# Patient Record
Sex: Male | Born: 1955 | Race: Black or African American | Hispanic: No | Marital: Married | Smoking: Former smoker
Health system: Southern US, Community
[De-identification: ages and names within clinical notes are randomized; demographics above are authoritative.]

## PROBLEM LIST (undated history)

## (undated) DIAGNOSIS — Z87442 Personal history of urinary calculi: Secondary | ICD-10-CM

## (undated) DIAGNOSIS — E7800 Pure hypercholesterolemia, unspecified: Secondary | ICD-10-CM

## (undated) DIAGNOSIS — N281 Cyst of kidney, acquired: Secondary | ICD-10-CM

## (undated) DIAGNOSIS — C9110 Chronic lymphocytic leukemia of B-cell type not having achieved remission: Secondary | ICD-10-CM

## (undated) DIAGNOSIS — I10 Essential (primary) hypertension: Secondary | ICD-10-CM

## (undated) DIAGNOSIS — M1990 Unspecified osteoarthritis, unspecified site: Secondary | ICD-10-CM

## (undated) HISTORY — PX: TONSILLECTOMY AND ADENOIDECTOMY: SHX28

## (undated) HISTORY — PX: COLONOSCOPY: SHX174

## (undated) HISTORY — PX: WISDOM TOOTH EXTRACTION: SHX21

## (undated) HISTORY — PX: TONSILLECTOMY: SUR1361

## (undated) HISTORY — PX: JOINT REPLACEMENT: SHX530

## (undated) HISTORY — PX: BACK SURGERY: SHX140

## (undated) HISTORY — PX: APPENDECTOMY: SHX54

---
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
## 2001-10-06 ENCOUNTER — Encounter: Admission: RE | Admit: 2001-10-06 | Discharge: 2002-01-04 | Payer: Self-pay | Admitting: Anesthesiology

## 2001-10-14 ENCOUNTER — Ambulatory Visit (HOSPITAL_COMMUNITY): Admission: RE | Admit: 2001-10-14 | Discharge: 2001-10-14 | Payer: Self-pay | Admitting: Anesthesiology

## 2001-10-14 ENCOUNTER — Encounter: Payer: Self-pay | Admitting: Anesthesiology

## 2002-01-08 ENCOUNTER — Encounter: Admission: RE | Admit: 2002-01-08 | Discharge: 2002-01-26 | Payer: Self-pay

## 2002-02-01 ENCOUNTER — Encounter: Admission: RE | Admit: 2002-02-01 | Discharge: 2002-05-02 | Payer: Self-pay | Admitting: Anesthesiology

## 2002-06-11 ENCOUNTER — Encounter: Admission: RE | Admit: 2002-06-11 | Discharge: 2002-09-09 | Payer: Self-pay

## 2002-09-20 ENCOUNTER — Encounter
Admission: RE | Admit: 2002-09-20 | Discharge: 2002-12-19 | Payer: Self-pay | Admitting: Physical Medicine & Rehabilitation

## 2003-01-12 ENCOUNTER — Encounter
Admission: RE | Admit: 2003-01-12 | Discharge: 2003-04-12 | Payer: Self-pay | Admitting: Physical Medicine & Rehabilitation

## 2003-01-25 ENCOUNTER — Encounter: Payer: Self-pay | Admitting: Physical Medicine & Rehabilitation

## 2003-01-25 ENCOUNTER — Ambulatory Visit (HOSPITAL_COMMUNITY)
Admission: RE | Admit: 2003-01-25 | Discharge: 2003-01-25 | Payer: Self-pay | Admitting: Physical Medicine & Rehabilitation

## 2003-02-14 ENCOUNTER — Ambulatory Visit (HOSPITAL_COMMUNITY)
Admission: RE | Admit: 2003-02-14 | Discharge: 2003-02-14 | Payer: Self-pay | Admitting: Physical Medicine & Rehabilitation

## 2003-02-14 ENCOUNTER — Encounter: Payer: Self-pay | Admitting: Physical Medicine & Rehabilitation

## 2003-05-17 ENCOUNTER — Encounter
Admission: RE | Admit: 2003-05-17 | Discharge: 2003-08-15 | Payer: Self-pay | Admitting: Physical Medicine & Rehabilitation

## 2003-08-11 ENCOUNTER — Encounter
Admission: RE | Admit: 2003-08-11 | Discharge: 2003-11-09 | Payer: Self-pay | Admitting: Physical Medicine & Rehabilitation

## 2003-11-10 ENCOUNTER — Encounter
Admission: RE | Admit: 2003-11-10 | Discharge: 2004-02-08 | Payer: Self-pay | Admitting: Physical Medicine & Rehabilitation

## 2015-02-22 ENCOUNTER — Ambulatory Visit: Payer: Self-pay | Admitting: Physician Assistant

## 2015-02-22 NOTE — H&P (Signed)
TOTAL KNEE ADMISSION H&P  Patient is being admitted for left total knee arthroplasty.  Subjective:  Chief Complaint:left knee pain.  HPI: Kevin Patterson, 59 y.o. male, has a history of pain and functional disability in the left knee due to arthritis and has failed non-surgical conservative treatments for greater than 12 weeks to includecorticosteriod injections, viscosupplementation injections and activity modification.  Onset of symptoms was gradual, starting >10 years ago with gradually worsening course since that time. The patient noted prior procedures on the knee to include  arthroscopy on the bilaterally knee(s).  Patient currently rates pain in the left knee(s) at 8 out of 10 with activity. Patient has night pain, worsening of pain with activity and weight bearing, pain that interferes with activities of daily living, pain with passive range of motion, crepitus and joint swelling.  Patient has evidence of periarticular osteophytes and joint space narrowing by imaging studies.There is no active infection.  PMH: HTN, high cholesterol, boderline renal insufficiency, lumbar DDD, remote hx lumbar staph infection 2001, bilat knee OA Surgical Hx: bilat knee scopes 1972, appendectomy 1979  Meds: HCTZ, amlodipine besylate, myrbetrig, benicar, atorvastatin, aspirin  Allergies NKDA  FH: mother- stroke, seizures; father cancer;   SH: 1/2 ppd smoker for 17yrs stopped 1982, ETOH 2-3 glasses 2-3 times a week, married lives with spouse 3 story residence, works as Adult nurse  Review of Systems  Genitourinary: Positive for frequency and hematuria.  Musculoskeletal: Positive for back pain, joint pain and falls.  All other systems reviewed and are negative.   Objective:  Physical Exam  Constitutional: He is oriented to person, place, and time. He appears well-developed and well-nourished. No distress.  HENT:  Head: Normocephalic and atraumatic.  Nose: Nose normal.  Eyes:  Conjunctivae and EOM are normal. Pupils are equal, round, and reactive to light.  Neck: Normal range of motion. Neck supple.  Cardiovascular: Normal rate, regular rhythm and intact distal pulses.   Murmur heard. Respiratory: Effort normal and breath sounds normal. No respiratory distress. He has no wheezes. He has no rales. He exhibits no tenderness.  GI: Soft. Bowel sounds are normal. He exhibits no distension. There is no tenderness.  Musculoskeletal:       Left knee: He exhibits swelling. He exhibits normal range of motion, no LCL laxity and no MCL laxity. Tenderness found.  Lymphadenopathy:    He has no cervical adenopathy.  Neurological: He is alert and oriented to person, place, and time. No cranial nerve deficit.  Skin: Skin is warm and dry. No rash noted. No erythema.  Psychiatric: He has a normal mood and affect. His behavior is normal.    Vital signs in last 24 hours: @VSRANGES @  Labs:   There is no height or weight on file to calculate BMI.   Imaging Review Plain radiographs demonstrate moderate degenerative joint disease of the left knee(s). The overall alignment issignificant varus. The bone quality appears to be good for age and reported activity level.  Assessment/Plan:  End stage arthritis, left knee   The patient history, physical examination, clinical judgment of the provider and imaging studies are consistent with end stage degenerative joint disease of the left knee(s) and total knee arthroplasty is deemed medically necessary. The treatment options including medical management, injection therapy arthroscopy and arthroplasty were discussed at length. The risks and benefits of total knee arthroplasty were presented and reviewed. The risks due to aseptic loosening, infection, stiffness, patella tracking problems, thromboembolic complications and other imponderables were discussed. The patient acknowledged the  explanation, agreed to proceed with the plan and consent was  signed. Patient is being admitted for inpatient treatment for surgery, pain control, PT, OT, prophylactic antibiotics, VTE prophylaxis, progressive ambulation and ADL's and discharge planning. The patient is planning to be discharged home with home health services

## 2015-03-02 ENCOUNTER — Encounter (HOSPITAL_COMMUNITY): Payer: Self-pay

## 2015-03-02 ENCOUNTER — Encounter (HOSPITAL_COMMUNITY)
Admission: RE | Admit: 2015-03-02 | Discharge: 2015-03-02 | Disposition: A | Discharge status: Home / Self Care | Payer: Managed Care, Other (non HMO) | Source: Ambulatory Visit | Attending: Physician Assistant | Admitting: Physician Assistant

## 2015-03-02 ENCOUNTER — Encounter (HOSPITAL_COMMUNITY)
Admission: RE | Admit: 2015-03-02 | Discharge: 2015-03-02 | Disposition: A | Discharge status: Home / Self Care | Payer: Managed Care, Other (non HMO) | Source: Ambulatory Visit | Attending: Orthopedic Surgery | Admitting: Orthopedic Surgery

## 2015-03-02 DIAGNOSIS — Z01818 Encounter for other preprocedural examination: Secondary | ICD-10-CM | POA: Diagnosis present

## 2015-03-02 DIAGNOSIS — Z0183 Encounter for blood typing: Secondary | ICD-10-CM | POA: Diagnosis not present

## 2015-03-02 DIAGNOSIS — R9431 Abnormal electrocardiogram [ECG] [EKG]: Secondary | ICD-10-CM | POA: Diagnosis not present

## 2015-03-02 DIAGNOSIS — I10 Essential (primary) hypertension: Secondary | ICD-10-CM | POA: Insufficient documentation

## 2015-03-02 DIAGNOSIS — M1712 Unilateral primary osteoarthritis, left knee: Secondary | ICD-10-CM | POA: Diagnosis not present

## 2015-03-02 DIAGNOSIS — Z01812 Encounter for preprocedural laboratory examination: Secondary | ICD-10-CM | POA: Insufficient documentation

## 2015-03-02 HISTORY — DX: Cyst of kidney, acquired: N28.1

## 2015-03-02 HISTORY — DX: Unspecified osteoarthritis, unspecified site: M19.90

## 2015-03-02 HISTORY — DX: Essential (primary) hypertension: I10

## 2015-03-02 LAB — COMPREHENSIVE METABOLIC PANEL
ALT: 20 U/L (ref 17–63)
AST: 31 U/L (ref 15–41)
Albumin: 4.3 g/dL (ref 3.5–5.0)
Alkaline Phosphatase: 57 U/L (ref 38–126)
Anion gap: 9 (ref 5–15)
BILIRUBIN TOTAL: 1.3 mg/dL — AB (ref 0.3–1.2)
BUN: 17 mg/dL (ref 6–20)
CHLORIDE: 104 mmol/L (ref 101–111)
CO2: 25 mmol/L (ref 22–32)
CREATININE: 1.19 mg/dL (ref 0.61–1.24)
Calcium: 9.7 mg/dL (ref 8.9–10.3)
Glucose, Bld: 94 mg/dL (ref 65–99)
Potassium: 4.1 mmol/L (ref 3.5–5.1)
Sodium: 138 mmol/L (ref 135–145)
TOTAL PROTEIN: 7.4 g/dL (ref 6.5–8.1)

## 2015-03-02 LAB — CBC WITH DIFFERENTIAL/PLATELET
Basophils Absolute: 0 10*3/uL (ref 0.0–0.1)
Basophils Relative: 0 %
EOS PCT: 2 %
Eosinophils Absolute: 0.1 10*3/uL (ref 0.0–0.7)
HCT: 40 % (ref 39.0–52.0)
Hemoglobin: 12.6 g/dL — ABNORMAL LOW (ref 13.0–17.0)
LYMPHS ABS: 2.6 10*3/uL (ref 0.7–4.0)
LYMPHS PCT: 42 %
MCH: 28.1 pg (ref 26.0–34.0)
MCHC: 31.5 g/dL (ref 30.0–36.0)
MCV: 89.1 fL (ref 78.0–100.0)
MONO ABS: 0.5 10*3/uL (ref 0.1–1.0)
Monocytes Relative: 9 %
Neutro Abs: 2.9 10*3/uL (ref 1.7–7.7)
Neutrophils Relative %: 47 %
PLATELETS: 203 10*3/uL (ref 150–400)
RBC: 4.49 MIL/uL (ref 4.22–5.81)
RDW: 13.3 % (ref 11.5–15.5)
WBC: 6.1 10*3/uL (ref 4.0–10.5)

## 2015-03-02 LAB — URINALYSIS, ROUTINE W REFLEX MICROSCOPIC
Bilirubin Urine: NEGATIVE
Glucose, UA: NEGATIVE mg/dL
Ketones, ur: NEGATIVE mg/dL
LEUKOCYTES UA: NEGATIVE
NITRITE: NEGATIVE
PH: 6.5 (ref 5.0–8.0)
Protein, ur: NEGATIVE mg/dL
SPECIFIC GRAVITY, URINE: 1.014 (ref 1.005–1.030)
Urobilinogen, UA: 0.2 mg/dL (ref 0.0–1.0)

## 2015-03-02 LAB — URINE MICROSCOPIC-ADD ON

## 2015-03-02 LAB — SURGICAL PCR SCREEN
MRSA, PCR: NEGATIVE
Staphylococcus aureus: NEGATIVE

## 2015-03-02 LAB — TYPE AND SCREEN
ABO/RH(D): O POS
Antibody Screen: NEGATIVE

## 2015-03-02 LAB — APTT: aPTT: 29 seconds (ref 24–37)

## 2015-03-02 LAB — ABO/RH: ABO/RH(D): O POS

## 2015-03-02 LAB — PROTIME-INR
INR: 1.02 (ref 0.00–1.49)
PROTHROMBIN TIME: 13.6 s (ref 11.6–15.2)

## 2015-03-02 NOTE — Pre-Procedure Instructions (Signed)
    Kevin Patterson  03/02/2015     No Pharmacies Listed   Your procedure is scheduled on Monday March 13, 2015  Report to [facility] Admitting at 5:30 A.M.  Call this number if you have problems the morning of surgery:  [phone]   Remember:  Do not eat food or drink liquids after midnight.  Take these medicines the morning of surgery with A SIP OF WATER :Mrybetrig, amlodipine. Please do not take herbal medications, vitamins, aspirin products BC's Goody's, iburpofen or naproxyn containing medications 5 days prior to surgery.   Do not wear lotions, powders, or perfumes.  You may wear deodorant.  Do not shave 48 hours prior to surgery.  Men may shave face and neck.  Do not bring valuables to the hospital.  [facility] is not responsible for any belongings or valuables.  Contacts, dentures or bridgework may not be worn into surgery.  Leave your suitcase in the car.  After surgery it may be brought to your room.  For patients admitted to the hospital, discharge time will be determined by your treatment team.    Please read over the following fact sheets that you were given. Pain Booklet, Coughing and Deep Breathing, MRSA Information and Surgical Site Infection Prevention

## 2015-03-02 NOTE — Pre-Procedure Instructions (Deleted)
    Laiden Milles  03/02/2015     No Pharmacies Listed   Your procedure is scheduled on Mon, Nov 14 @ 7:15 AM  Report to [facility] Admitting at 5:30 AM  Call this number if you have problems the morning of surgery:  [phone]   Remember:  Do not eat food or drink liquids after midnight.  Take these medicines the morning of surgery with A SIP OF WATER              No Goody's,BC's,Aleve,Aspirin,Ibuprofen,Motrin,Advil,Fish Oil,or any Herbal Medications.    Do not wear jewelry.  Do not wear lotions, powders, or colognes .  You may wear deodorant.             Men may shave face and neck.  Do not bring valuables to the hospital.  [facility] is not responsible for any belongings or valuables.  Contacts, dentures or bridgework may not be worn into surgery.  Leave your suitcase in the car.  After surgery it may be brought to your room.  For patients admitted to the hospital, discharge time will be determined by your treatment team.  Patients discharged the day of surgery will not be allowed to drive home.    Special instructions:  [place] - Preparing for Surgery  Before surgery, you can play an important role.  Because skin is not sterile, your skin needs to be as free of germs as possible.  You can reduce the number of germs on you skin by washing with CHG (chlorahexidine gluconate) soap before surgery.  CHG is an antiseptic cleaner which kills germs and bonds with the skin to continue killing germs even after washing.  Please DO NOT use if you have an allergy to CHG or antibacterial soaps.  If your skin becomes reddened/irritated stop using the CHG and inform your nurse when you arrive at Short Stay.  Do not shave (including legs and underarms) for at least 48 hours prior to the first CHG shower.  You may shave your face.   Please read over the following fact sheets that you were given. Pain Booklet, Coughing and Deep Breathing, Blood Transfusion  Information, MRSA Information and Surgical Site Infection Prevention

## 2015-03-03 LAB — URINE CULTURE: Culture: 8000

## 2015-03-13 ENCOUNTER — Encounter (HOSPITAL_COMMUNITY)
Admission: RE | Disposition: A | Discharge status: Home Health | Payer: Self-pay | Source: Ambulatory Visit | Attending: Orthopedic Surgery

## 2015-03-13 ENCOUNTER — Inpatient Hospital Stay (HOSPITAL_COMMUNITY): Payer: Managed Care, Other (non HMO) | Admitting: Certified Registered Nurse Anesthetist

## 2015-03-13 ENCOUNTER — Encounter (HOSPITAL_COMMUNITY): Payer: Self-pay | Admitting: General Practice

## 2015-03-13 ENCOUNTER — Inpatient Hospital Stay (HOSPITAL_COMMUNITY)
Admission: RE | Admit: 2015-03-13 | Discharge: 2015-03-15 | DRG: 470 | Disposition: A | Discharge status: Home Health | Payer: Managed Care, Other (non HMO) | Source: Ambulatory Visit | Attending: Orthopedic Surgery | Admitting: Orthopedic Surgery

## 2015-03-13 DIAGNOSIS — Z7982 Long term (current) use of aspirin: Secondary | ICD-10-CM | POA: Diagnosis not present

## 2015-03-13 DIAGNOSIS — Z79899 Other long term (current) drug therapy: Secondary | ICD-10-CM

## 2015-03-13 DIAGNOSIS — R066 Hiccough: Secondary | ICD-10-CM | POA: Diagnosis not present

## 2015-03-13 DIAGNOSIS — M25562 Pain in left knee: Secondary | ICD-10-CM | POA: Diagnosis present

## 2015-03-13 DIAGNOSIS — N281 Cyst of kidney, acquired: Secondary | ICD-10-CM | POA: Diagnosis present

## 2015-03-13 DIAGNOSIS — M170 Bilateral primary osteoarthritis of knee: Secondary | ICD-10-CM | POA: Diagnosis present

## 2015-03-13 DIAGNOSIS — E7800 Pure hypercholesterolemia, unspecified: Secondary | ICD-10-CM | POA: Diagnosis present

## 2015-03-13 DIAGNOSIS — M179 Osteoarthritis of knee, unspecified: Secondary | ICD-10-CM | POA: Diagnosis present

## 2015-03-13 DIAGNOSIS — I10 Essential (primary) hypertension: Secondary | ICD-10-CM | POA: Diagnosis present

## 2015-03-13 DIAGNOSIS — M1710 Unilateral primary osteoarthritis, unspecified knee: Secondary | ICD-10-CM | POA: Diagnosis present

## 2015-03-13 HISTORY — PX: TOTAL KNEE ARTHROPLASTY: SHX125

## 2015-03-13 SURGERY — ARTHROPLASTY, KNEE, TOTAL
Anesthesia: Monitor Anesthesia Care | Site: Knee | Laterality: Left

## 2015-03-13 MED ORDER — CEFAZOLIN SODIUM-DEXTROSE 2-3 GM-% IV SOLR
2.0000 g | INTRAVENOUS | Status: AC
  Administered 2015-03-13: 2 g via INTRAVENOUS
  Filled 2015-03-13: qty 50

## 2015-03-13 MED ORDER — POLYETHYLENE GLYCOL 3350 17 G PO PACK
17.0000 g | PACK | Freq: Two times a day (BID) | ORAL | Status: DC
  Administered 2015-03-13 – 2015-03-15 (×5): 17 g via ORAL
  Filled 2015-03-13 (×5): qty 1

## 2015-03-13 MED ORDER — INFLUENZA VAC SPLIT QUAD 0.5 ML IM SUSY
0.5000 mL | PREFILLED_SYRINGE | INTRAMUSCULAR | Status: AC
  Administered 2015-03-14: 0.5 mL via INTRAMUSCULAR
  Filled 2015-03-13 (×2): qty 0.5

## 2015-03-13 MED ORDER — ACETAMINOPHEN 325 MG PO TABS: 650.0000 mg | ORAL_TABLET | Freq: Four times a day (QID) | ORAL | Status: DC | PRN

## 2015-03-13 MED ORDER — CHLORHEXIDINE GLUCONATE 4 % EX LIQD
60.0000 mL | Freq: Once | CUTANEOUS | Status: DC
Start: 2015-03-13 — End: 2015-03-13

## 2015-03-13 MED ORDER — MIDAZOLAM HCL 2 MG/2ML IJ SOLN
INTRAMUSCULAR | Status: AC
  Filled 2015-03-13: qty 4

## 2015-03-13 MED ORDER — ATORVASTATIN CALCIUM 40 MG PO TABS
40.0000 mg | ORAL_TABLET | Freq: Every day | ORAL | Status: DC
  Administered 2015-03-13 – 2015-03-14 (×2): 40 mg via ORAL
  Filled 2015-03-13 (×2): qty 1

## 2015-03-13 MED ORDER — FENTANYL CITRATE (PF) 100 MCG/2ML IJ SOLN
INTRAMUSCULAR | Status: DC | PRN
  Administered 2015-03-13: 25 ug via INTRAVENOUS
  Administered 2015-03-13: 50 ug via INTRAVENOUS

## 2015-03-13 MED ORDER — ONDANSETRON HCL 4 MG/2ML IJ SOLN
INTRAMUSCULAR | Status: AC
  Filled 2015-03-13: qty 2

## 2015-03-13 MED ORDER — BUPIVACAINE-EPINEPHRINE (PF) 0.25% -1:200000 IJ SOLN
INTRAMUSCULAR | Status: AC
  Filled 2015-03-13: qty 30

## 2015-03-13 MED ORDER — CEFUROXIME SODIUM 1.5 G IJ SOLR
INTRAMUSCULAR | Status: AC
  Filled 2015-03-13: qty 1.5

## 2015-03-13 MED ORDER — LIDOCAINE HCL (CARDIAC) 20 MG/ML IV SOLN
INTRAVENOUS | Status: AC
  Filled 2015-03-13: qty 5

## 2015-03-13 MED ORDER — ONDANSETRON HCL 4 MG/2ML IJ SOLN: 4.0000 mg | Freq: Four times a day (QID) | INTRAMUSCULAR | Status: DC | PRN

## 2015-03-13 MED ORDER — MIDAZOLAM HCL 5 MG/5ML IJ SOLN
INTRAMUSCULAR | Status: DC | PRN
  Administered 2015-03-13 (×2): 1 mg via INTRAVENOUS

## 2015-03-13 MED ORDER — DEXAMETHASONE SODIUM PHOSPHATE 4 MG/ML IJ SOLN
INTRAMUSCULAR | Status: DC | PRN
  Administered 2015-03-13: 10 mg via INTRAVENOUS

## 2015-03-13 MED ORDER — MIRABEGRON ER 25 MG PO TB24
50.0000 mg | ORAL_TABLET | Freq: Every day | ORAL | Status: DC
  Administered 2015-03-13 – 2015-03-15 (×3): 50 mg via ORAL
  Filled 2015-03-13 (×4): qty 2

## 2015-03-13 MED ORDER — OXYCODONE HCL 5 MG PO TABS: 5.0000 mg | ORAL_TABLET | Freq: Once | ORAL | Status: DC | PRN

## 2015-03-13 MED ORDER — OXYCODONE HCL 5 MG PO TABS
5.0000 mg | ORAL_TABLET | ORAL | Status: DC | PRN
  Administered 2015-03-13 – 2015-03-15 (×13): 10 mg via ORAL
  Filled 2015-03-13 (×13): qty 2

## 2015-03-13 MED ORDER — LACTATED RINGERS IV SOLN
INTRAVENOUS | Status: DC | PRN
  Administered 2015-03-13 (×2): via INTRAVENOUS

## 2015-03-13 MED ORDER — BUPIVACAINE IN DEXTROSE 0.75-8.25 % IT SOLN
INTRATHECAL | Status: DC | PRN
  Administered 2015-03-13: 2 mL via INTRATHECAL

## 2015-03-13 MED ORDER — ACETAMINOPHEN 160 MG/5ML PO SOLN
325.0000 mg | ORAL | Status: DC | PRN
  Filled 2015-03-13: qty 20.3

## 2015-03-13 MED ORDER — PROPOFOL 500 MG/50ML IV EMUL
INTRAVENOUS | Status: DC | PRN
  Administered 2015-03-13: 25 ug/kg/min via INTRAVENOUS
  Administered 2015-03-13: 09:00:00 via INTRAVENOUS

## 2015-03-13 MED ORDER — BUPIVACAINE-EPINEPHRINE (PF) 0.5% -1:200000 IJ SOLN
INTRAMUSCULAR | Status: DC | PRN
  Administered 2015-03-13: 15 mL via PERINEURAL

## 2015-03-13 MED ORDER — ONDANSETRON HCL 4 MG/2ML IJ SOLN
INTRAMUSCULAR | Status: DC | PRN
  Administered 2015-03-13: 4 mg via INTRAVENOUS

## 2015-03-13 MED ORDER — CEFAZOLIN SODIUM-DEXTROSE 2-3 GM-% IV SOLR
2.0000 g | Freq: Four times a day (QID) | INTRAVENOUS | Status: AC
  Administered 2015-03-13 (×2): 2 g via INTRAVENOUS
  Filled 2015-03-13 (×2): qty 50

## 2015-03-13 MED ORDER — CALCIUM CARBONATE-VITAMIN D 500-200 MG-UNIT PO TABS
1.0000 | ORAL_TABLET | Freq: Every day | ORAL | Status: DC
  Administered 2015-03-13 – 2015-03-15 (×3): 1 via ORAL
  Filled 2015-03-13 (×3): qty 1

## 2015-03-13 MED ORDER — PHENOL 1.4 % MT LIQD: 1.0000 | OROMUCOSAL | Status: DC | PRN

## 2015-03-13 MED ORDER — SODIUM CHLORIDE 0.9 % IR SOLN
Status: DC | PRN
  Administered 2015-03-13: 1

## 2015-03-13 MED ORDER — ALUM & MAG HYDROXIDE-SIMETH 200-200-20 MG/5ML PO SUSP: 30.0000 mL | ORAL | Status: DC | PRN

## 2015-03-13 MED ORDER — POTASSIUM CHLORIDE IN NACL 20-0.9 MEQ/L-% IV SOLN
INTRAVENOUS | Status: DC
  Administered 2015-03-13 (×2): via INTRAVENOUS
  Filled 2015-03-13 (×2): qty 1000

## 2015-03-13 MED ORDER — IRBESARTAN 150 MG PO TABS: 150.0000 mg | ORAL_TABLET | Freq: Every day | ORAL | Status: DC

## 2015-03-13 MED ORDER — ACETAMINOPHEN 650 MG RE SUPP: 650.0000 mg | Freq: Four times a day (QID) | RECTAL | Status: DC | PRN

## 2015-03-13 MED ORDER — DOCUSATE SODIUM 100 MG PO CAPS
100.0000 mg | ORAL_CAPSULE | Freq: Two times a day (BID) | ORAL | Status: DC
  Administered 2015-03-13 – 2015-03-15 (×5): 100 mg via ORAL
  Filled 2015-03-13 (×5): qty 1

## 2015-03-13 MED ORDER — GLYCOPYRROLATE 0.2 MG/ML IJ SOLN
INTRAMUSCULAR | Status: DC | PRN
  Administered 2015-03-13: 0.2 mg via INTRAVENOUS

## 2015-03-13 MED ORDER — ACETAMINOPHEN 325 MG PO TABS: 325.0000 mg | ORAL_TABLET | ORAL | Status: DC | PRN

## 2015-03-13 MED ORDER — MENTHOL 3 MG MT LOZG: 1.0000 | LOZENGE | OROMUCOSAL | Status: DC | PRN

## 2015-03-13 MED ORDER — HYDROCHLOROTHIAZIDE 25 MG PO TABS
25.0000 mg | ORAL_TABLET | Freq: Every day | ORAL | Status: DC
  Filled 2015-03-13: qty 1

## 2015-03-13 MED ORDER — FENTANYL CITRATE (PF) 250 MCG/5ML IJ SOLN
INTRAMUSCULAR | Status: AC
  Filled 2015-03-13: qty 5

## 2015-03-13 MED ORDER — CALCIUM-VITAMIN D-VITAMIN K 500-1000-40 MG-UNT-MCG PO CHEW: 1.0000 | CHEWABLE_TABLET | Freq: Every day | ORAL | Status: DC

## 2015-03-13 MED ORDER — METOCLOPRAMIDE HCL 5 MG/ML IJ SOLN: 5.0000 mg | Freq: Three times a day (TID) | INTRAMUSCULAR | Status: DC | PRN

## 2015-03-13 MED ORDER — DIPHENHYDRAMINE HCL 12.5 MG/5ML PO ELIX: 12.5000 mg | ORAL_SOLUTION | ORAL | Status: DC | PRN

## 2015-03-13 MED ORDER — ONDANSETRON HCL 4 MG PO TABS: 4.0000 mg | ORAL_TABLET | Freq: Four times a day (QID) | ORAL | Status: DC | PRN

## 2015-03-13 MED ORDER — METOCLOPRAMIDE HCL 5 MG PO TABS: 5.0000 mg | ORAL_TABLET | Freq: Three times a day (TID) | ORAL | Status: DC | PRN

## 2015-03-13 MED ORDER — DEXAMETHASONE SODIUM PHOSPHATE 10 MG/ML IJ SOLN
10.0000 mg | Freq: Three times a day (TID) | INTRAMUSCULAR | Status: AC
  Administered 2015-03-13 – 2015-03-14 (×3): 10 mg via INTRAVENOUS
  Filled 2015-03-13 (×3): qty 1

## 2015-03-13 MED ORDER — HYDROMORPHONE HCL 1 MG/ML IJ SOLN
1.0000 mg | INTRAMUSCULAR | Status: DC | PRN
  Administered 2015-03-13 – 2015-03-14 (×4): 1 mg via INTRAVENOUS
  Filled 2015-03-13 (×4): qty 1

## 2015-03-13 MED ORDER — APIXABAN 2.5 MG PO TABS
2.5000 mg | ORAL_TABLET | Freq: Two times a day (BID) | ORAL | Status: DC
  Administered 2015-03-14 – 2015-03-15 (×3): 2.5 mg via ORAL
  Filled 2015-03-13 (×3): qty 1

## 2015-03-13 MED ORDER — CELECOXIB 200 MG PO CAPS
200.0000 mg | ORAL_CAPSULE | Freq: Two times a day (BID) | ORAL | Status: DC
  Administered 2015-03-13 – 2015-03-15 (×5): 200 mg via ORAL
  Filled 2015-03-13 (×5): qty 1

## 2015-03-13 MED ORDER — HYDROMORPHONE HCL 1 MG/ML IJ SOLN: 0.2500 mg | INTRAMUSCULAR | Status: DC | PRN

## 2015-03-13 MED ORDER — OXYCODONE HCL 5 MG/5ML PO SOLN: 5.0000 mg | Freq: Once | ORAL | Status: DC | PRN

## 2015-03-13 MED ORDER — BUPIVACAINE-EPINEPHRINE 0.25% -1:200000 IJ SOLN
INTRAMUSCULAR | Status: DC | PRN
  Administered 2015-03-13: 30 mL

## 2015-03-13 MED ORDER — PROPOFOL 10 MG/ML IV BOLUS
INTRAVENOUS | Status: AC
  Filled 2015-03-13: qty 20

## 2015-03-13 MED ORDER — CEFUROXIME SODIUM 1.5 G IJ SOLR
INTRAMUSCULAR | Status: DC | PRN
  Administered 2015-03-13: 1.5 g

## 2015-03-13 MED ORDER — SODIUM CHLORIDE 0.9 % IV SOLN
INTRAVENOUS | Status: DC
Start: 2015-03-13 — End: 2015-03-13

## 2015-03-13 MED ORDER — TRANEXAMIC ACID 1000 MG/10ML IV SOLN
1000.0000 mg | INTRAVENOUS | Status: DC
  Filled 2015-03-13: qty 10

## 2015-03-13 MED ORDER — AMLODIPINE BESYLATE 10 MG PO TABS
10.0000 mg | ORAL_TABLET | Freq: Every day | ORAL | Status: DC
Start: 2015-03-15 — End: 2015-03-15
  Filled 2015-03-13: qty 1

## 2015-03-13 SURGICAL SUPPLY — 75 items
BANDAGE ESMARK 6X9 LF (GAUZE/BANDAGES/DRESSINGS) ×1 IMPLANT
BENZOIN TINCTURE PRP APPL 2/3 (GAUZE/BANDAGES/DRESSINGS) ×3 IMPLANT
BLADE SAGITTAL 25.0X1.19X90 (BLADE) ×2 IMPLANT
BLADE SAGITTAL 25.0X1.19X90MM (BLADE) ×1
BLADE SAW SGTL 11.0X1.19X90.0M (BLADE) IMPLANT
BLADE SAW SGTL 13.0X1.19X90.0M (BLADE) ×3 IMPLANT
BLADE SURG 10 STRL SS (BLADE) ×15 IMPLANT
BNDG COHESIVE 6X5 TAN NS LF (GAUZE/BANDAGES/DRESSINGS) ×3 IMPLANT
BNDG ELASTIC 6X10 VLCR STRL LF (GAUZE/BANDAGES/DRESSINGS) ×3 IMPLANT
BNDG ELASTIC 6X15 VLCR STRL LF (GAUZE/BANDAGES/DRESSINGS) ×3 IMPLANT
BNDG ESMARK 6X9 LF (GAUZE/BANDAGES/DRESSINGS) ×3
BOWL SMART MIX CTS (DISPOSABLE) ×3 IMPLANT
CAPT KNEE TOTAL 3 ATTUNE ×3 IMPLANT
CEMENT HV SMART SET (Cement) ×6 IMPLANT
CLOSURE WOUND 1/2 X4 (GAUZE/BANDAGES/DRESSINGS) ×1
COVER SURGICAL LIGHT HANDLE (MISCELLANEOUS) ×3 IMPLANT
CUFF TOURNIQUET SINGLE 34IN LL (TOURNIQUET CUFF) ×3 IMPLANT
CUFF TOURNIQUET SINGLE 44IN (TOURNIQUET CUFF) IMPLANT
DECANTER SPIKE VIAL GLASS SM (MISCELLANEOUS) ×3 IMPLANT
DRAPE EXTREMITY T 121X128X90 (DRAPE) ×3 IMPLANT
DRAPE INCISE IOBAN 66X45 STRL (DRAPES) ×3 IMPLANT
DRAPE PROXIMA HALF (DRAPES) ×3 IMPLANT
DRAPE U-SHAPE 47X51 STRL (DRAPES) ×3 IMPLANT
DRSG AQUACEL AG ADV 3.5X14 (GAUZE/BANDAGES/DRESSINGS) ×3 IMPLANT
DRSG PAD ABDOMINAL 8X10 ST (GAUZE/BANDAGES/DRESSINGS) ×6 IMPLANT
DURAPREP 26ML APPLICATOR (WOUND CARE) ×6 IMPLANT
ELECT CAUTERY BLADE 6.4 (BLADE) ×3 IMPLANT
ELECT REM PT RETURN 9FT ADLT (ELECTROSURGICAL) ×3
ELECTRODE REM PT RTRN 9FT ADLT (ELECTROSURGICAL) ×1 IMPLANT
EVACUATOR 1/8 PVC DRAIN (DRAIN) ×3 IMPLANT
FACESHIELD WRAPAROUND (MASK) ×3 IMPLANT
GAUZE SPONGE 4X4 12PLY STRL (GAUZE/BANDAGES/DRESSINGS) ×3 IMPLANT
GLOVE BIO SURGEON STRL SZ7 (GLOVE) ×3 IMPLANT
GLOVE BIOGEL PI IND STRL 7.0 (GLOVE) ×1 IMPLANT
GLOVE BIOGEL PI IND STRL 7.5 (GLOVE) ×1 IMPLANT
GLOVE BIOGEL PI INDICATOR 7.0 (GLOVE) ×2
GLOVE BIOGEL PI INDICATOR 7.5 (GLOVE) ×2
GLOVE SS BIOGEL STRL SZ 7.5 (GLOVE) ×1 IMPLANT
GLOVE SUPERSENSE BIOGEL SZ 7.5 (GLOVE) ×2
GOWN STRL REUS W/ TWL LRG LVL3 (GOWN DISPOSABLE) ×1 IMPLANT
GOWN STRL REUS W/ TWL XL LVL3 (GOWN DISPOSABLE) ×2 IMPLANT
GOWN STRL REUS W/TWL LRG LVL3 (GOWN DISPOSABLE) ×3
GOWN STRL REUS W/TWL XL LVL3 (GOWN DISPOSABLE) ×4
HANDPIECE INTERPULSE COAX TIP (DISPOSABLE) ×2
HOOD PEEL AWAY FACE SHEILD DIS (HOOD) ×6 IMPLANT
IMMOBILIZER KNEE 22 UNIV (SOFTGOODS) ×3 IMPLANT
KIT BASIN OR (CUSTOM PROCEDURE TRAY) ×3 IMPLANT
KIT ROOM TURNOVER OR (KITS) ×3 IMPLANT
MANIFOLD NEPTUNE II (INSTRUMENTS) ×3 IMPLANT
MARKER SKIN DUAL TIP RULER LAB (MISCELLANEOUS) ×3 IMPLANT
NS IRRIG 1000ML POUR BTL (IV SOLUTION) ×3 IMPLANT
PACK TOTAL JOINT (CUSTOM PROCEDURE TRAY) ×3 IMPLANT
PACK UNIVERSAL I (CUSTOM PROCEDURE TRAY) ×3 IMPLANT
PAD ABD 8X10 STRL (GAUZE/BANDAGES/DRESSINGS) ×3 IMPLANT
PAD ARMBOARD 7.5X6 YLW CONV (MISCELLANEOUS) ×6 IMPLANT
PADDING CAST COTTON 6X4 STRL (CAST SUPPLIES) ×3 IMPLANT
RUBBERBAND STERILE (MISCELLANEOUS) ×3 IMPLANT
SET HNDPC FAN SPRY TIP SCT (DISPOSABLE) ×1 IMPLANT
SPONGE GAUZE 4X4 12PLY STER LF (GAUZE/BANDAGES/DRESSINGS) ×3 IMPLANT
STRIP CLOSURE SKIN 1/2X4 (GAUZE/BANDAGES/DRESSINGS) ×2 IMPLANT
SUCTION FRAZIER TIP 10 FR DISP (SUCTIONS) ×3 IMPLANT
SUT MNCRL AB 3-0 PS2 18 (SUTURE) ×3 IMPLANT
SUT VIC AB 0 CT1 27 (SUTURE) ×4
SUT VIC AB 0 CT1 27XBRD ANBCTR (SUTURE) ×2 IMPLANT
SUT VIC AB 1 CT1 27 (SUTURE) ×2
SUT VIC AB 1 CT1 27XBRD ANBCTR (SUTURE) ×1 IMPLANT
SUT VIC AB 2-0 CT1 27 (SUTURE) ×4
SUT VIC AB 2-0 CT1 TAPERPNT 27 (SUTURE) ×2 IMPLANT
SYR 30ML SLIP (SYRINGE) ×3 IMPLANT
TOWEL OR 17X24 6PK STRL BLUE (TOWEL DISPOSABLE) ×3 IMPLANT
TOWEL OR 17X26 10 PK STRL BLUE (TOWEL DISPOSABLE) ×3 IMPLANT
TRAY FOLEY CATH 16FR SILVER (SET/KITS/TRAYS/PACK) ×3 IMPLANT
TUBE CONNECTING 12'X1/4 (SUCTIONS) ×1
TUBE CONNECTING 12X1/4 (SUCTIONS) ×2 IMPLANT
YANKAUER SUCT BULB TIP NO VENT (SUCTIONS) ×3 IMPLANT

## 2015-03-13 NOTE — Anesthesia Procedure Notes (Signed)
Spinal Patient location during procedure: OR Staffing Anesthesiologist: Shelsea Hangartner, Lucien Preanesthetic Checklist Completed: patient identified, surgical consent, pre-op evaluation, timeout performed, IV checked, risks and benefits discussed and monitors and equipment checked Spinal Block Patient position: sitting Prep: site prepped and draped and DuraPrep Patient monitoring: heart rate, cardiac monitor, continuous pulse ox and blood pressure Approach: midline Location: L3-4 Injection technique: single-shot Needle Needle type: Pencan  Needle gauge: 24 G Needle length: 10 cm Assessment Sensory level: T6  Anesthesia Regional Block:  Adductor canal block  Pre-Anesthetic Checklist: ,, timeout performed, Correct Patient, Correct Site, Correct Laterality, Correct Procedure, Correct Position, site marked, Risks and benefits discussed,  Surgical consent,  Pre-op evaluation,  At surgeon's request and post-op pain management  Laterality: Lower and Left  Prep: chloraprep       Needles:  Injection technique: Single-shot  Needle Type: Echogenic Stimulator Needle          Additional Needles:  Procedures: ultrasound guided (picture in chart) Adductor canal block Narrative:  Injection made incrementally with aspirations every 5 mL.  Performed by: Personally  Anesthesiologist: Buell Parcel, Deandrae  Additional Notes: H+P and labs reviewed, risks and benefits discussed with patient, procedure tolerated well without complications

## 2015-03-13 NOTE — Op Note (Signed)
MRN:     [id number] DOB/AGE:    Oct 10, 1955 / 59 y.o.       OPERATIVE REPORT    DATE OF PROCEDURE:  03/13/2015       PREOPERATIVE DIAGNOSIS:   PRIMARY LOCALIZED OA LEFT KNEE      Estimated body mass index is 30.69 kg/(m^2) as calculated from the following:   Height as of this encounter: 6\' 3"  (1.905 m).   Weight as of this encounter: 111.358 kg (245 lb 8 oz).                                                        POSTOPERATIVE DIAGNOSIS:   SAME                                                                   PROCEDURE:  Procedure(s): LEFT TOTAL KNEE ARTHROPLASTY Using Depuy Attune RP implants #8 Femur, #9Tibia, 52mm attune RP bearing, 38 Patella     SURGEON: Narvel Kozub A    ASSISTANT:  Kirstin Shepperson PA-C   (Present and scrubbed throughout the case, critical for assistance with exposure, retraction, instrumentation, and closure.)         ANESTHESIA: Spinal with Femoral Nerve Block  DRAINS: foley, 2 medium hemovac in knee   TOURNIQUET TIME: [id number]   COMPLICATIONS:  None     SPECIMENS: None   INDICATIONS FOR PROCEDURE: The patient has  OA LEFT KNEE, varus deformities, XR shows bone on bone arthritis. Patient has failed all conservative measures including anti-inflammatory medicines, narcotics, attempts at  exercise and weight loss, cortisone injections and viscosupplementation.  Risks and benefits of surgery have been discussed, questions answered.   DESCRIPTION OF PROCEDURE: The patient identified by armband, received  right femoral nerve block and IV antibiotics, in the holding area at [facility]. Patient taken to the operating room, appropriate anesthetic  monitors were attached General endotracheal anesthesia induced with  the patient in supine position, Foley catheter was inserted. Tourniquet  applied high to the operative thigh. Lateral post and foot positioner  applied to the table, the lower extremity was then prepped and draped  in usual sterile fashion from  the ankle to the tourniquet. Time-out procedure was performed. The limb was wrapped with an Esmarch bandage and the tourniquet inflated to 365 mmHg. We began the operation by making the anterior midline incision starting at handbreadth above the patella going over the patella 1 cm medial to and  4 cm distal to the tibial tubercle. Small bleeders in the skin and the  subcutaneous tissue identified and cauterized. Transverse retinaculum was incised and reflected medially and a medial parapatellar arthrotomy was accomplished. the patella was everted and theprepatellar fat pad resected. The superficial medial collateral  ligament was then elevated from anterior to posterior along the proximal  flare of the tibia and anterior half of the menisci resected. The knee was hyperflexed exposing bone on bone arthritis. Peripheral and notch osteophytes as well as the cruciate ligaments were then resected. We continued to  work our way around posteriorly along the proximal tibia, and externally  rotated the tibia subluxing it out from underneath the femur. A McHale  retractor was placed through the notch and a lateral Hohmann retractor  placed, and we then drilled through the proximal tibia in line with the  axis of the tibia followed by an intramedullary guide rod and 2-degree  posterior slope cutting guide. The tibial cutting guide was pinned into place  allowing resection of 4 mm of bone medially and about 6 mm of bone  laterally because of her varus deformity. Satisfied with the tibial resection, we then  entered the distal femur 2 mm anterior to the PCL origin with the  intramedullary guide rod and applied the distal femoral cutting guide  set at 23mm, with 5 degrees of valgus. This was pinned along the  epicondylar axis. At this point, the distal femoral cut was accomplished without difficulty. We then sized for a #8 femoral component and pinned the guide in 3 degrees of external rotation.The chamfer cutting  guide was pinned into place. The anterior, posterior, and chamfer cuts were accomplished without difficulty followed by  the Attune RP box cutting guide and the box cut. We also removed posterior osteophytes from the posterior femoral condyles. At this  time, the knee was brought into full extension. We checked our  extension and flexion gaps and found them symmetric at 32mm.  The patella thickness measured at 28 mm. We set the cutting guide at 17 and removed the posterior 9.5-10 mm  of the patella sized for 38 button and drilled the lollipop. The knee  was then once again hyperflexed exposing the proximal tibia. We sized for a #9 tibial base plate, applied the smokestack and the conical reamer followed by the the Delta fin keel punch. We then hammered into place the Atuune RP trial femoral component, inserted a 6-mm trial bearing, trial patellar button, and took the knee through range of motion from 0-130 degrees. No thumb pressure was required for patellar  tracking. At this point, all trial components were removed, a double batch of DePuy HV cement with 1500 mg of Zinacef was mixed and applied to all bony metallic mating surfaces except for the posterior condyles of the femur itself. In order, we  hammered into place the tibial tray and removed excess cement, the femoral component and removed excess cement, a 6-mm Attune RP bearing  was inserted, and the knee brought to full extension with compression.  The patellar button was clamped into place, and excess cement  removed. While the cement cured the wound was irrigated out with normal saline solution pulse lavage, and medium Hemovac drains were placed.. Ligament stability and patellar tracking were checked and found to be excellent. The tourniquet was then released and hemostasis was obtained with cautery. The parapatellar arthrotomy was closed with  #1 ethibond suture. The subcutaneous tissue with 0 and 2-0 undyed  Vicryl suture, and 4-0 Monocryl.. A  dressing of Xeroform,  4 x 4, dressing sponges, Webril, and Ace wrap applied. Needle and sponge count were correct times 2.The patient awakened, extubated, and taken to recovery room without difficulty. Vascular status was normal, pulses 2+ and symmetric.   Luba Matzen A 03/13/2015, 9:08 AM

## 2015-03-13 NOTE — Progress Notes (Signed)
Utilization review completed.  

## 2015-03-13 NOTE — Interval H&P Note (Signed)
History and Physical Interval Note:  03/13/2015 6:51 AM  Kevin Patterson  has presented today for surgery, with the diagnosis of PRIMARY LOCALIZED OA LEFT KNEE  The various methods of treatment have been discussed with the patient and family. After consideration of risks, benefits and other options for treatment, the patient has consented to  Procedure(s): LEFT TOTAL KNEE ARTHROPLASTY (Left) as a surgical intervention .  The patient's history has been reviewed, patient examined, no change in status, stable for surgery.  I have reviewed the patient's chart and labs.  Questions were answered to the patient's satisfaction.     Elsie Saas A

## 2015-03-13 NOTE — Progress Notes (Signed)
Orthopedic Tech Progress Note Patient Details:  Kevin Patterson [birth year] [id number]  CPM Left Knee CPM Left Knee: On Left Knee Flexion (Degrees): 90 Left Knee Extension (Degrees): 0 Additional Comments: Trapeze bar and foot roll   [place] Pink 03/13/2015, 10:58 AM

## 2015-03-13 NOTE — Evaluation (Signed)
Physical Therapy Evaluation Patient Details Name: Kevin Patterson MRN: [id number] DOB: 1955/08/04 Today's Date: 03/13/2015   History of Present Illness  Patient is a 59 y/o male s/p left TKA. PMH includes HTN.  Clinical Impression  Patient presents with pain and post surgical deficits LLE s/p left TKA. Instructed pt in exercises and knee precautions. Tolerated short distance ambulation with Min guard assist for safety. Tolerated transfer to chair. Pt will have support from wife at home. Very independent PTA. Will follow acutely to maximize independence and mobility prior to return home.    Follow Up Recommendations Home health PT;Supervision/Assistance - 24 hour    Equipment Recommendations  Rolling walker with 5" wheels    Recommendations for Other Services OT consult     Precautions / Restrictions Precautions Precautions: Knee Precaution Booklet Issued: No Precaution Comments: Reviewed no pillow under knee and precautions. Required Braces or Orthoses: Knee Immobilizer - Left Knee Immobilizer - Left: Discontinue once straight leg raise with < 10 degree lag Restrictions Weight Bearing Restrictions: Yes LLE Weight Bearing: Weight bearing as tolerated      Mobility  Bed Mobility Overal bed mobility: Needs Assistance Bed Mobility: Supine to Sit     Supine to sit: Min guard;HOB elevated     General bed mobility comments: Use of rails. No physical assist needed.  Transfers Overall transfer level: Needs assistance Equipment used: Rolling walker (2 wheeled) Transfers: Sit to/from Stand Sit to Stand: Min assist;From elevated surface         General transfer comment: Min A to boost from EOB with cues for hand placement/technique.  Ambulation/Gait Ambulation/Gait assistance: Min guard Ambulation Distance (Feet): 8 Feet Assistive device: Rolling walker (2 wheeled) Gait Pattern/deviations: Step-to pattern;Decreased stride length;Decreased stance time - left;Decreased  step length - right Gait velocity: decreased   General Gait Details: SLow, steady gait with increased WB through BUEs to offload LLE.  Stairs            Wheelchair Mobility    Modified Rankin (Stroke Patients Only)       Balance Overall balance assessment: Needs assistance Sitting-balance support: Feet supported;No upper extremity supported Sitting balance-Leahy Scale: Good     Standing balance support: During functional activity Standing balance-Leahy Scale: Fair Standing balance comment: Able to stand unsupported for short periods however requires use of UEs for dynamic standing/walking.                             Pertinent Vitals/Pain Pain Assessment: 0-10 Pain Score: 6  Pain Location: left knee Pain Descriptors / Indicators: Sore;Operative site guarding Pain Intervention(s): Repositioned;Monitored during session    [place] expects to be discharged to:: Private residence Living Arrangements: Spouse/significant other Available Help at Discharge: Family;Available 24 hours/day Type of Home: House Home Access: Stairs to enter Entrance Stairs-Rails: Right Entrance Stairs-Number of Steps: 2 Home Layout: One level Home Equipment: None      Prior Function Level of Independence: Independent         Comments: Works out. Works in Engineer, production.     Hand Dominance        Extremity/Trunk Assessment   Upper Extremity Assessment: Defer to OT evaluation           Lower Extremity Assessment: LLE deficits/detail   LLE Deficits / Details: Limited AROM/strength secondary to pain and surgery.     Communication   Communication: No difficulties  Cognition Arousal/Alertness: Awake/alert Behavior During Therapy: WFL for  tasks assessed/performed Overall Cognitive Status: Within Functional Limits for tasks assessed                      General Comments      Exercises Total Joint Exercises Ankle  Circles/Pumps: Both;10 reps;Supine Quad Sets: Both;10 reps;Supine Gluteal Sets: Both;10 reps;Seated      Assessment/Plan    PT Assessment Patient needs continued PT services  PT Diagnosis Difficulty walking;Acute pain   PT Problem List Decreased strength;Pain;Decreased range of motion;Decreased activity tolerance;Decreased balance;Decreased mobility;Impaired sensation  PT Treatment Interventions Balance training;Gait training;Stair training;Functional mobility training;Therapeutic activities;Therapeutic exercise;Patient/family education   PT Goals (Current goals can be found in the Care Plan section) Acute Rehab PT Goals Patient Stated Goal: to get back to independence PT Goal Formulation: With patient Time For Goal Achievement: 03/27/15 Potential to Achieve Goals: Good    Frequency 7X/week   Barriers to discharge        Co-evaluation               End of Session Equipment Utilized During Treatment: Gait belt Activity Tolerance: Patient tolerated treatment well Patient left: in chair;with call bell/phone within reach Nurse Communication: Mobility status         Time: 1500-1530 PT Time Calculation (min) (ACUTE ONLY): 30 min   Charges:   PT Evaluation $Initial PT Evaluation Tier I: 1 Procedure PT Treatments $Therapeutic Activity: 8-22 mins   PT G Codes:        Tationna Fullard A Tishie Altmann 03/13/2015, 4:31 PM  Wray Kearns, Reid Hope King, DPT [phone]

## 2015-03-13 NOTE — H&P (View-Only) (Signed)
TOTAL KNEE ADMISSION H&P  Patient is being admitted for left total knee arthroplasty.  Subjective:  Chief Complaint:left knee pain.  HPI: Kevin Patterson, 59 y.o. male, has a history of pain and functional disability in the left knee due to arthritis and has failed non-surgical conservative treatments for greater than 12 weeks to includecorticosteriod injections, viscosupplementation injections and activity modification.  Onset of symptoms was gradual, starting >10 years ago with gradually worsening course since that time. The patient noted prior procedures on the knee to include  arthroscopy on the bilaterally knee(s).  Patient currently rates pain in the left knee(s) at 8 out of 10 with activity. Patient has night pain, worsening of pain with activity and weight bearing, pain that interferes with activities of daily living, pain with passive range of motion, crepitus and joint swelling.  Patient has evidence of periarticular osteophytes and joint space narrowing by imaging studies.There is no active infection.  PMH: HTN, high cholesterol, boderline renal insufficiency, lumbar DDD, remote hx lumbar staph infection 2001, bilat knee OA Surgical Hx: bilat knee scopes 1972, appendectomy 1979  Meds: HCTZ, amlodipine besylate, myrbetrig, benicar, atorvastatin, aspirin  Allergies NKDA  FH: mother- stroke, seizures; father cancer;   SH: 1/2 ppd smoker for 6yrs stopped 1982, ETOH 2-3 glasses 2-3 times a week, married lives with spouse 3 story residence, works as construction manager  Review of Systems  Genitourinary: Positive for frequency and hematuria.  Musculoskeletal: Positive for back pain, joint pain and falls.  All other systems reviewed and are negative.   Objective:  Physical Exam  Constitutional: He is oriented to person, place, and time. He appears well-developed and well-nourished. No distress.  HENT:  Head: Normocephalic and atraumatic.  Nose: Nose normal.  Eyes:  Conjunctivae and EOM are normal. Pupils are equal, round, and reactive to light.  Neck: Normal range of motion. Neck supple.  Cardiovascular: Normal rate, regular rhythm and intact distal pulses.   Murmur heard. Respiratory: Effort normal and breath sounds normal. No respiratory distress. He has no wheezes. He has no rales. He exhibits no tenderness.  GI: Soft. Bowel sounds are normal. He exhibits no distension. There is no tenderness.  Musculoskeletal:       Left knee: He exhibits swelling. He exhibits normal range of motion, no LCL laxity and no MCL laxity. Tenderness found.  Lymphadenopathy:    He has no cervical adenopathy.  Neurological: He is alert and oriented to person, place, and time. No cranial nerve deficit.  Skin: Skin is warm and dry. No rash noted. No erythema.  Psychiatric: He has a normal mood and affect. His behavior is normal.    Vital signs in last 24 hours: @VSRANGES@  Labs:   There is no height or weight on file to calculate BMI.   Imaging Review Plain radiographs demonstrate moderate degenerative joint disease of the left knee(s). The overall alignment issignificant varus. The bone quality appears to be good for age and reported activity level.  Assessment/Plan:  End stage arthritis, left knee   The patient history, physical examination, clinical judgment of the provider and imaging studies are consistent with end stage degenerative joint disease of the left knee(s) and total knee arthroplasty is deemed medically necessary. The treatment options including medical management, injection therapy arthroscopy and arthroplasty were discussed at length. The risks and benefits of total knee arthroplasty were presented and reviewed. The risks due to aseptic loosening, infection, stiffness, patella tracking problems, thromboembolic complications and other imponderables were discussed. The patient acknowledged the   explanation, agreed to proceed with the plan and consent was  signed. Patient is being admitted for inpatient treatment for surgery, pain control, PT, OT, prophylactic antibiotics, VTE prophylaxis, progressive ambulation and ADL's and discharge planning. The patient is planning to be discharged home with home health services   

## 2015-03-13 NOTE — Care Management Note (Signed)
Case Management Note  Patient Details  Name: Kevin Patterson MRN: [id number] Date of Birth: 1955-08-09  Subjective/Objective:       S/p left total knee arthroplasty             Action/Plan: Spoke with patient then contacted the company, [employer] [phone], which his insurance uses to set up home health services. Spoke with Mechele Claude and requested HHPT with services to start 03/15/15 in case patient able to discharge on 03/14/15. Faxed demographics, HHPT order, face to face, H and P and op note to [phone] and received confirmation. Will follow up with [facility] on 03/14/15 to verify that home health is set up and which agency. Will follow up to set up equipment for d/c.     Expected Discharge Date:                  Expected Discharge Plan:  [name]  In-House Referral:  NA  Discharge planning Services  CM Consult  Post Acute Care Choice:  Durable Medical Equipment, Home Health Choice offered to:     DME Arranged:    DME Agency:     HH Arranged:    HH Agency:     Status of Service:  In process, will continue to follow  Medicare Important Message Given:    Date Medicare IM Given:    Medicare IM give by:    Date Additional Medicare IM Given:    Additional Medicare Important Message give by:     If discussed at [place] of Stay Meetings, dates discussed:    Additional Comments:  Nila Nephew, RN 03/13/2015, 2:45 PM

## 2015-03-13 NOTE — Transfer of Care (Signed)
Immediate Anesthesia Transfer of Care Note  Patient: Kevin Patterson  Procedure(s) Performed: Procedure(s): LEFT TOTAL KNEE ARTHROPLASTY (Left)  Patient Location: PACU  Anesthesia Type:MAC, Regional and Spinal  Level of Consciousness: awake, alert  and oriented  Airway & Oxygen Therapy: Patient Spontanous Breathing and Patient connected to face mask oxygen  Post-op Assessment: Report given to RN and Post -op Vital signs reviewed and stable  Post vital signs: Reviewed and stable  Last Vitals:  Filed Vitals:   03/13/15 0939  BP: 108/60  Pulse: 58  Temp: 36.6 C  Resp: 15    Complications: No apparent anesthesia complications

## 2015-03-13 NOTE — Anesthesia Preprocedure Evaluation (Addendum)
Anesthesia Evaluation  Patient identified by MRN, date of birth, ID band Patient awake    Reviewed: Allergy & Precautions, NPO status , Patient's Chart, lab work & pertinent test results  History of Anesthesia Complications Negative for: history of anesthetic complications  Airway Mallampati: II  TM Distance: >3 FB Neck ROM: Full    Dental  (+) Teeth Intact   Pulmonary neg shortness of breath, neg sleep apnea, neg COPD, neg recent URI, former smoker, neg PE   breath sounds clear to auscultation       Cardiovascular hypertension, Pt. on medications  Rhythm:Regular     Neuro/Psych negative neurological ROS  negative psych ROS   GI/Hepatic negative GI ROS, Neg liver ROS,   Endo/Other  negative endocrine ROS  Renal/GU Renal InsufficiencyRenal disease     Musculoskeletal  (+) Arthritis , Osteoarthritis,    Abdominal   Peds  Hematology negative hematology ROS (+)   Anesthesia Other Findings   Reproductive/Obstetrics                            Anesthesia Physical Anesthesia Plan  ASA: II  Anesthesia Plan: MAC and Spinal   Post-op Pain Management: MAC Combined w/ Regional for Post-op pain   Induction: Intravenous  Airway Management Planned: Natural Airway, Nasal Cannula and Simple Face Mask  Additional Equipment: None  Intra-op Plan:   Post-operative Plan:   Informed Consent: I have reviewed the patients History and Physical, chart, labs and discussed the procedure including the risks, benefits and alternatives for the proposed anesthesia with the patient or authorized representative who has indicated his/her understanding and acceptance.   Dental advisory given  Plan Discussed with: Surgeon and CRNA  Anesthesia Plan Comments:        Anesthesia Quick Evaluation

## 2015-03-14 ENCOUNTER — Encounter (HOSPITAL_COMMUNITY): Payer: Self-pay | Admitting: Orthopedic Surgery

## 2015-03-14 LAB — BASIC METABOLIC PANEL
Anion gap: 9 (ref 5–15)
BUN: 14 mg/dL (ref 6–20)
CALCIUM: 8.8 mg/dL — AB (ref 8.9–10.3)
CO2: 24 mmol/L (ref 22–32)
CREATININE: 1.17 mg/dL (ref 0.61–1.24)
Chloride: 103 mmol/L (ref 101–111)
GFR calc Af Amer: >60 mL/min (ref >60)
GLUCOSE: 134 mg/dL — AB (ref 65–99)
Potassium: 4.3 mmol/L (ref 3.5–5.1)
Sodium: 136 mmol/L (ref 135–145)

## 2015-03-14 LAB — CBC
HEMATOCRIT: 31.8 % — AB (ref 39.0–52.0)
Hemoglobin: 9.9 g/dL — ABNORMAL LOW (ref 13.0–17.0)
MCH: 27.8 pg (ref 26.0–34.0)
MCHC: 31.1 g/dL (ref 30.0–36.0)
MCV: 89.3 fL (ref 78.0–100.0)
PLATELETS: 206 10*3/uL (ref 150–400)
RBC: 3.56 MIL/uL — ABNORMAL LOW (ref 4.22–5.81)
RDW: 12.8 % (ref 11.5–15.5)
WBC: 10.8 10*3/uL — ABNORMAL HIGH (ref 4.0–10.5)

## 2015-03-14 MED ORDER — SODIUM CHLORIDE 0.9 % IV SOLN
25.0000 mg | Freq: Three times a day (TID) | INTRAVENOUS | Status: DC
  Administered 2015-03-14 – 2015-03-15 (×3): 25 mg via INTRAVENOUS
  Filled 2015-03-14 (×5): qty 1

## 2015-03-14 NOTE — Care Management Note (Signed)
Case Management Note  Patient Details  Name: Kevin Patterson MRN: [id number] Date of Birth: June 05, 1955  Subjective/Objective:      S/p left total knee arthroplasty              Action/Plan: Contacted [facility],company through patient's insurance that sets up [facility] they set up HHPT with [facility]. Contacted Amy at [facility] and verified that they will be providing HHPT starting 03/16/15. Spoke with patient and his wife, informed them that [facility] will be providing HHPT. [facility] will be providing CPM at home and delivering rolling walker and 3N1 to patient's room. Patient stated that his wife will be able to assist him after discharge.  Expected Discharge Date:                  Expected Discharge Plan:  [place]  In-House Referral:  NA  Discharge planning Services  CM Consult  Post Acute Care Choice:  Durable Medical Equipment, Home Health Choice offered to:     DME Arranged:  3-N-1, CPM, Walker rolling DME Agency:  [employer]  HH Arranged:  PT [place]:  [place]  Status of Service:  Completed, signed off  Medicare Important Message Given:    Date Medicare IM Given:    Medicare IM give by:    Date Additional Medicare IM Given:    Additional Medicare Important Message give by:     If discussed at [place] of Stay Meetings, dates discussed:    Additional Comments:  Nila Nephew, RN 03/14/2015, 11:38 AM

## 2015-03-14 NOTE — Discharge Instructions (Signed)
Information on my medicine - ELIQUIS (apixaban)  This medication education was reviewed with me or my healthcare representative as part of my discharge preparation.  The pharmacist that spoke with me during my hospital stay was:  Duayne Cal, [facility]  Why was Eliquis prescribed for you? Eliquis was prescribed for you to reduce the risk of blood clots forming after orthopedic surgery.    What do You need to know about Eliquis? Take your Eliquis TWICE DAILY - one tablet in the morning and one tablet in the evening with or without food.  It would be best to take the dose about the same time each day.  If you have difficulty swallowing the tablet whole please discuss with your pharmacist how to take the medication safely.  Take Eliquis exactly as prescribed by your doctor and DO NOT stop taking Eliquis without talking to the doctor who prescribed the medication.  Stopping without other medication to take the place of Eliquis may increase your risk of developing a clot.  After discharge, you should have regular check-up appointments with your healthcare provider that is prescribing your Eliquis.  What do you do if you miss a dose? If a dose of ELIQUIS is not taken at the scheduled time, take it as soon as possible on the same day and twice-daily administration should be resumed.  The dose should not be doubled to make up for a missed dose.  Do not take more than one tablet of ELIQUIS at the same time.  Important Safety Information A possible side effect of Eliquis is bleeding. You should call your healthcare provider right away if you experience any of the following: ? Bleeding from an injury or your nose that does not stop. ? Unusual colored urine (red or dark brown) or unusual colored stools (red or black). ? Unusual bruising for unknown reasons. ? A serious fall or if you hit your head (even if there is no bleeding).  Some medicines may interact with Eliquis and might increase  your risk of bleeding or clotting while on Eliquis. To help avoid this, consult your healthcare provider or pharmacist prior to using any new prescription or non-prescription medications, including herbals, vitamins, non-steroidal anti-inflammatory drugs (NSAIDs) and supplements.  This website has more information on Eliquis (apixaban): http://www.eliquis.com/eliquis/home

## 2015-03-14 NOTE — Evaluation (Signed)
Occupational Therapy Evaluation Patient Details Name: Kevin Patterson MRN: [id number] DOB: 04-Apr-1956 Today's Date: 03/14/2015    History of Present Illness Patient is a 59 y/o male s/p left TKA. PMH includes HTN.   Clinical Impression   Pt reports he was independent with ADLs and mobility PTA. Currently pt is overall supervision for ADLs and functional mobility with the exception of min A for LB ADLs. All education completed; pt and wife with no further questions or concerns for OT at this time. Pt planning to d/c home with 24/7 supervision from family. Pt ready to d/c home from an OT standpoint; signing off at this time. Thank you for this referral.     Follow Up Recommendations  No OT follow up;Supervision - Intermittent    Equipment Recommendations  3 in 1 bedside comode    Recommendations for Other Services       Precautions / Restrictions Precautions Precautions: Knee Restrictions Weight Bearing Restrictions: Yes LLE Weight Bearing: Weight bearing as tolerated      Mobility Bed Mobility              General bed mobility comments: Pt OOB in BR upon arrival, returned to chair at end of session  Transfers Overall transfer level: Needs assistance Equipment used: Rolling walker (2 wheeled) Transfers: Sit to/from Stand Sit to Stand: Supervision         General transfer comment: Supervision for safety. Good hand placement and technique.    Balance Overall balance assessment: Needs assistance Sitting-balance support: Feet supported;No upper extremity supported Sitting balance-Leahy Scale: Good     Standing balance support: During functional activity;No upper extremity supported Standing balance-Leahy Scale: Fair Standing balance comment: Able to stand at sink to complete grooming with no UE supported                            ADL Overall ADL's : Needs assistance/impaired Eating/Feeding: Set up;Sitting   Grooming: Wash/dry  face;Supervision/safety;Standing Grooming Details (indicate cue type and reason): Pt standing in bathroom completing washing and shaving face with supervision from his wife.      Lower Body Bathing: Supervison/ safety;Sit to/from stand       Lower Body Dressing: Minimal assistance;Sit to/from stand Lower Body Dressing Details (indicate cue type and reason): Min A to thread clothing over LLE and for L sock/shoe. Discussed use of AE for increased independence; pt reports wife will assist as needed. Educated on compensatory strategies for LB ADLs; pt verbalized understanding.  Toilet Transfer: Supervision/safety;Ambulation;Comfort height toilet;Grab bars;RW Armed forces technical officer Details (indicate cue type and reason): Educated on use of 3 in 1 over toilet and in shower; pt verbalized understanding. Toileting- Clothing Manipulation and Hygiene: Supervision/safety;Sit to/from stand   Tub/ Shower Transfer: Supervision/safety;Walk-in shower;Ambulation;Rolling walker Tub/Shower Transfer Details (indicate cue type and reason): Educated on walk in shower transfer technique; pt able to return demonstrate understanding. Functional mobility during ADLs: Supervision/safety;Rolling walker General ADL Comments: Wife present for OT eval. Educated on edema management techniques, home safety, need for supervision for safety for ADLs and mobility; pt verbalized understanding.     Vision     Perception     Praxis      Pertinent Vitals/Pain Pain Assessment: Faces Pain Score: 4  Faces Pain Scale: Hurts a little bit Pain Location: L knee Pain Descriptors / Indicators: Sore Pain Intervention(s): Limited activity within patient's tolerance;Monitored during session;Repositioned;Ice applied     Hand Dominance     Extremity/Trunk Assessment  Upper Extremity Assessment Upper Extremity Assessment: Overall WFL for tasks assessed   Lower Extremity Assessment Lower Extremity Assessment: Defer to PT evaluation    Cervical / Trunk Assessment Cervical / Trunk Assessment: Normal   Communication Communication Communication: No difficulties   Cognition Arousal/Alertness: Awake/alert Behavior During Therapy: WFL for tasks assessed/performed Overall Cognitive Status: Within Functional Limits for tasks assessed                     General Comments       Exercises       Shoulder Instructions      Home Living Family/patient expects to be discharged to:: Private residence Living Arrangements: Spouse/significant other Available Help at Discharge: Family;Available 24 hours/day Type of Home: House Home Access: Stairs to enter [employer] of Steps: 2 Entrance Stairs-Rails: Right Home Layout: Multi-level;Able to live on main level with bedroom/bathroom     Bathroom Shower/Tub: Occupational psychologist: Handicapped height Bathroom Accessibility: Yes How Accessible: Accessible via walker Home Equipment: None          Prior Functioning/Environment Level of Independence: Independent        Comments: Works out. Works in Engineer, production.    OT Diagnosis: Acute pain   OT Problem List:     OT Treatment/Interventions:      OT Goals(Current goals can be found in the care plan section) Acute Rehab OT Goals Patient Stated Goal: to get back to independence OT Goal Formulation: With patient  OT Frequency:     Barriers to D/C:            Co-evaluation              End of Session Equipment Utilized During Treatment: Rolling walker CPM Left Knee CPM Left Knee: Off Left Knee Flexion (Degrees): 60 Left Knee Extension (Degrees): 0  Activity Tolerance: Patient tolerated treatment well Patient left: in chair;with call bell/phone within reach;with family/visitor present;with nursing/sitter in room (zero degree bone foam applied to LLE )   [id number] OT Time Calculation (min): 14 min Charges:  OT General Charges $OT Visit: 1 Procedure OT  Evaluation $Initial OT Evaluation Tier I: 1 Procedure G-Codes:     Binnie Kand M.S., OTR/L Pager: [phone]  03/14/2015, 10:10 AM

## 2015-03-14 NOTE — Progress Notes (Signed)
Physical Therapy Treatment Patient Details Name: Kevin Patterson MRN: [id number] DOB: [birth year] Today's Date: 04-10-2015    History of Present Illness Patient is a 59 y/o male s/p left TKA. PMH includes HTN.    PT Comments    Mr.Mclucas is moving well with increased gait distance, performed stairs and HEP. Educated for CPM, foam roll, progression and plan. Will continue to follow.   Follow Up Recommendations  Home health PT;Supervision for mobility/OOB     Equipment Recommendations       Recommendations for Other Services       Precautions / Restrictions Precautions Precautions: Knee Restrictions LLE Weight Bearing: Weight bearing as tolerated    Mobility  Bed Mobility Overal bed mobility: Modified Independent                Transfers Overall transfer level: Needs assistance   Transfers: Sit to/from Stand Sit to Stand: Supervision         General transfer comment: cues for hand placement and sequence  Ambulation/Gait Ambulation/Gait assistance: Supervision Ambulation Distance (Feet): 150 Feet Assistive device: Rolling walker (2 wheeled) Gait Pattern/deviations: Step-through pattern;Decreased stride length;Decreased stance time - left   Gait velocity interpretation: Below normal speed for age/gender General Gait Details: cues for increased weight bearing on LLE and increased heel strike   Stairs Stairs: Yes Stairs assistance: Min guard Stair Management: Forwards;One rail Right Number of Stairs: 5 General stair comments: pt performed 5 steps with rail and cues for sequence as well as one step backward with RW to enter home  Wheelchair Mobility    Modified Rankin (Stroke Patients Only)       Balance                                    Cognition Arousal/Alertness: Awake/alert Behavior During Therapy: WFL for tasks assessed/performed Overall Cognitive Status: Within Functional Limits for tasks assessed                       Exercises Total Joint Exercises Heel Slides: AROM;Left;15 reps;Supine Straight Leg Raises: AROM;Left;15 reps;Supine Long Arc Quad: AROM;Seated;Left;15 reps Goniometric ROM: 5-68    General Comments        Pertinent Vitals/Pain Pain Score: 4  Pain Location: left knee Pain Descriptors / Indicators: Sore Pain Intervention(s): Repositioned;Premedicated before session;Limited activity within patient's tolerance;Monitored during session    Home Living                      Prior Function            PT Goals (current goals can now be found in the care plan section) Progress towards PT goals: Progressing toward goals    Frequency       PT Plan Current plan remains appropriate    Co-evaluation             End of Session   Activity Tolerance: Patient tolerated treatment well Patient left: in chair;with call bell/phone within reach;with family/visitor present     Time: [id number] PT Time Calculation (min) (ACUTE ONLY): 41 min  Charges:  $Gait Training: 8-22 mins $Therapeutic Exercise: 8-22 mins $Therapeutic Activity: 8-22 mins                    G Codes:      Melford Aase 04-10-15, 9:00 AM Elwyn Reach, Patterson

## 2015-03-14 NOTE — Progress Notes (Signed)
Subjective: 1 Day Post-Op Procedure(s) (LRB): LEFT TOTAL KNEE ARTHROPLASTY (Left) Patient reports pain as 5 on 0-10 scale.    Objective: Vital signs in last 24 hours: Temp:  [97.9 F (36.6 C)-98.3 F (36.8 C)] 98.3 F (36.8 C) (11/15 0538) Pulse Rate:  [55-78] 73 (11/15 0538) Resp:  [11-18] 18 (11/15 0538) BP: (104-125)/(59-76) 115/59 mmHg (11/15 0538) SpO2:  [98 %-100 %] 98 % (11/15 0538)  Intake/Output from previous day: 11/14 0701 - 11/15 0700 In: 3211.7 [P.O.:240; I.V.:2871.7; IV Piggyback:100] Out: 5350 [Urine:5000; Drains:300; Blood:50] Intake/Output this shift:     Recent Labs  03/14/15 0513  HGB 9.9*    Recent Labs  03/14/15 0513  WBC 10.8*  RBC 3.56*  HCT 31.8*  PLT 206    Recent Labs  03/14/15 0513  NA 136  K 4.3  CL 103  CO2 24  BUN 14  CREATININE 1.17  GLUCOSE 134*  CALCIUM 8.8*   No results for input(s): LABPT, INR in the last 72 hours.  ABD soft Neurovascular intact Sensation intact distally Intact pulses distally Incision: dressing C/D/I Compartment soft hiccough are very bothersome  Assessment/Plan: 1 Day Post-Op Procedure(s) (LRB): LEFT TOTAL KNEE ARTHROPLASTY (Left) Advance diet Up with therapy Plan for discharge tomorrow  Thorazine for hiccoughs  Kevin Patterson 03/14/2015, 9:00 AM

## 2015-03-14 NOTE — Progress Notes (Signed)
Physical Therapy Treatment Patient Details Name: Kevin Patterson MRN: [id number] DOB: 09/26/1955 Today's Date: March 31, 2015    History of Present Illness Patient is a 59 y/o male s/p left TKA. PMH includes HTN.    PT Comments    Pt had improved heel strike and increased number of stairs. Pt continues to improve with gait, ROM, and exercise tolerance. Will continue to follow, planning for D/C tomorrow.   Follow Up Recommendations  Home health PT;Supervision for mobility/OOB     Equipment Recommendations       Recommendations for Other Services       Precautions / Restrictions Precautions Precautions: Knee Precaution Booklet Issued: No Restrictions LLE Weight Bearing: Weight bearing as tolerated    Mobility  Bed Mobility Overal bed mobility: Modified Independent Bed Mobility: Supine to Sit;Sit to Supine              Transfers Overall transfer level: Modified independent                  Ambulation/Gait Ambulation/Gait assistance: Supervision Ambulation Distance (Feet): 200 Feet Assistive device: Rolling walker (2 wheeled) Gait Pattern/deviations: Step-through pattern;Decreased stance time - left   Gait velocity interpretation: Below normal speed for age/gender General Gait Details: cues for increased weight bearing on LLE and increased heel strike   Stairs Stairs: Yes Stairs assistance: Supervision Stair Management: One rail Right;Forwards Number of Stairs: 10 General stair comments: No cues for sequence, supervision for safety  Wheelchair Mobility    Modified Rankin (Stroke Patients Only)       Balance                                    Cognition Arousal/Alertness: Awake/alert Behavior During Therapy: WFL for tasks assessed/performed Overall Cognitive Status: Within Functional Limits for tasks assessed                      Exercises Total Joint Exercises Heel Slides: AROM;Left;15 reps;Supine Straight Leg  Raises: AROM;Left;15 reps;Supine Long Arc Quad: AROM;Seated;Left;15 Theatre manager in Standing: Seated;AROM;Both;15 reps    General Comments        Pertinent Vitals/Pain Pain Assessment: No/denies pain    Home Living                      Prior Function            PT Goals (current goals can now be found in the care plan section) Progress towards PT goals: Progressing toward goals    Frequency       PT Plan Current plan remains appropriate    Co-evaluation             End of Session   Activity Tolerance: Patient tolerated treatment well Patient left: in CPM;with call bell/phone within reach;with family/visitor present;in bed     Time: 1335-1403 PT Time Calculation (min) (ACUTE ONLY): 28 min  Charges:  $Gait Training: 8-22 mins $Therapeutic Exercise: 8-22 mins                    G CodesHaynes Bast 03/31/2015, 2:18 PM Haynes Bast, SPT 2015-03-31 2:18 PM

## 2015-03-14 NOTE — Anesthesia Postprocedure Evaluation (Signed)
  Anesthesia Post-op Note  Patient: Kevin Patterson  Procedure(s) Performed: Procedure(s): LEFT TOTAL KNEE ARTHROPLASTY (Left)  Patient Location: PACU  Anesthesia Type:Regional and Spinal  Level of Consciousness: awake  Airway and Oxygen Therapy: Patient Spontanous Breathing  Post-op Pain: mild  Post-op Assessment: Post-op Vital signs reviewed, Patient's Cardiovascular Status Stable, Respiratory Function Stable, Patent Airway, No signs of Nausea or vomiting and Pain level controlled LLE Motor Response: Purposeful movement, Responds to commands LLE Sensation: Full sensation, No numbness, No tingling, Pain RLE Motor Response: Purposeful movement, Responds to commands RLE Sensation: Full sensation L Sensory Level: S1-Sole of foot, small toes R Sensory Level: S1-Sole of foot, small toes  Post-op Vital Signs: Reviewed and stable  Last Vitals:  Filed Vitals:   03/14/15 0538  BP: 115/59  Pulse: 73  Temp: 36.8 C  Resp: 18    Complications: No apparent anesthesia complications

## 2015-03-15 LAB — CBC
HEMATOCRIT: 28.7 % — AB (ref 39.0–52.0)
HEMOGLOBIN: 8.9 g/dL — AB (ref 13.0–17.0)
MCH: 27.7 pg (ref 26.0–34.0)
MCHC: 31 g/dL (ref 30.0–36.0)
MCV: 89.4 fL (ref 78.0–100.0)
Platelets: 201 10*3/uL (ref 150–400)
RBC: 3.21 MIL/uL — AB (ref 4.22–5.81)
RDW: 12.9 % (ref 11.5–15.5)
WBC: 10.2 10*3/uL (ref 4.0–10.5)

## 2015-03-15 LAB — BASIC METABOLIC PANEL
ANION GAP: 5 (ref 5–15)
BUN: 14 mg/dL (ref 6–20)
CHLORIDE: 105 mmol/L (ref 101–111)
CO2: 29 mmol/L (ref 22–32)
Calcium: 8.8 mg/dL — ABNORMAL LOW (ref 8.9–10.3)
Creatinine, Ser: 1.2 mg/dL (ref 0.61–1.24)
GFR calc non Af Amer: >60 mL/min (ref >60)
Glucose, Bld: 106 mg/dL — ABNORMAL HIGH (ref 65–99)
POTASSIUM: 4.2 mmol/L (ref 3.5–5.1)
SODIUM: 139 mmol/L (ref 135–145)

## 2015-03-15 MED ORDER — DOCUSATE SODIUM 100 MG PO CAPS: ORAL_CAPSULE | ORAL | Status: DC

## 2015-03-15 MED ORDER — OXYCODONE HCL 5 MG PO TABS: ORAL_TABLET | ORAL | Status: DC

## 2015-03-15 MED ORDER — APIXABAN 2.5 MG PO TABS: ORAL_TABLET | ORAL | Status: DC

## 2015-03-15 MED ORDER — POLYETHYLENE GLYCOL 3350 17 G PO PACK: PACK | ORAL | Status: DC

## 2015-03-15 NOTE — Progress Notes (Signed)
Physical Therapy Treatment Patient Details Name: Joneric Devereux MRN: [id number] DOB: December 15, 1955 Today's Date: 03/15/2015    History of Present Illness Patient is a 59 y/o male s/p left TKA. PMH includes HTN.    PT Comments    The pt performed very well in treatment today and should recover quickly with HHPT.  Pt and wife felt comfortable with stairs and did not need to practice them again today.  Pt walks with good posture and heel to toe sequence and requires only minimal cues for safety.  Goniometric ROM measured 4-80.  Ready for D/C from PT standpoint.    Follow Up Recommendations  Home health PT;Supervision for mobility/OOB     Equipment Recommendations  Rolling walker with 5" wheels       Precautions / Restrictions Precautions Precautions: Knee Precaution Booklet Issued: No Restrictions Weight Bearing Restrictions: Yes LLE Weight Bearing: Weight bearing as tolerated    Mobility  Bed Mobility               General bed mobility comments: Pt. in chair, returned to chair at end of treatment  Transfers Overall transfer level: Modified independent Equipment used: Rolling walker (2 wheeled) Transfers: Sit to/from Stand Sit to Stand: Modified independent (Device/Increase time)         General transfer comment: Mod I as pt uses arms of chair to push up and takes increased time for sit <-> stand.  Ambulation/Gait Ambulation/Gait assistance: Supervision Ambulation Distance (Feet): 500 Feet Assistive device: Rolling walker (2 wheeled) Gait Pattern/deviations: Step-through pattern;Decreased stance time - left     General Gait Details: cues for posture and relaxation of shoulders.  Pt. able to correct and maintain through rest of ambulation.  supervision for safety.       Balance Overall balance assessment: Needs assistance Sitting-balance support: Feet supported Sitting balance-Leahy Scale: Good     Standing balance support: Bilateral upper extremity  supported Standing balance-Leahy Scale: Good                      Cognition Arousal/Alertness: Awake/alert Behavior During Therapy: WFL for tasks assessed/performed Overall Cognitive Status: Within Functional Limits for tasks assessed                      Exercises Total Joint Exercises Ankle Circles/Pumps: AROM;Both;10 reps;Seated Long Arc Quad: AROM;Left;10 reps;Seated Knee Flexion: AROM;AAROM;Left;10 reps;Seated Goniometric ROM: 4-80 (long sitting in recliner - AROM)        Pertinent Vitals/Pain Pain Assessment: 0-10 Pain Score: 4  Pain Location: L knee Pain Descriptors / Indicators: Aching Pain Intervention(s): Limited activity within patient's tolerance;Monitored during session;Repositioned           PT Goals (current goals can now be found in the care plan section) Acute Rehab PT Goals Patient Stated Goal: to go home Progress towards PT goals: Progressing toward goals    Frequency  7X/week    PT Plan Current plan remains appropriate       End of Session Equipment Utilized During Treatment: Gait belt Activity Tolerance: Patient tolerated treatment well Patient left: in chair;with call bell/phone within reach;with family/visitor present     Time: 1000-1019 PT Time Calculation (min) (ACUTE ONLY): 19 min  Charges:  1 Gait                      Heywood Footman, [place] - Office   03/15/2015, 10:33 AM

## 2015-03-15 NOTE — Discharge Summary (Signed)
Patient ID: Kevin Patterson MRN: [id number] DOB/AGE: 60-Aug-1957 60 y.o.  Admit date: 03/13/2015 Discharge date: 03/15/2015  Admission Diagnoses:  Active Problems:   DJD (degenerative joint disease) of knee   Discharge Diagnoses:  Same  Past Medical History  Diagnosis Date  . Hypertension   . Cyst of kidney, acquired     history of , monitored  . Arthritis     Surgeries: Procedure(s): LEFT TOTAL KNEE ARTHROPLASTY on 03/13/2015   Consultants:    Discharged Condition: Improved  Hospital Course: Holland Mihalik is an 59 y.o. male who was admitted 03/13/2015 for operative treatment of<principal problem not specified>. Patient has severe unremitting pain that affects sleep, daily activities, and work/hobbies. After pre-op clearance the patient was taken to the operating room on 03/13/2015 and underwent  Procedure(s): LEFT TOTAL KNEE ARTHROPLASTY.    Patient was given perioperative antibiotics: Anti-infectives    Start     Dose/Rate Route Frequency Ordered Stop   03/13/15 1400  ceFAZolin (ANCEF) IVPB 2 g/50 mL premix     2 g 100 mL/hr over 30 Minutes Intravenous Every 6 hours 03/13/15 1204 03/13/15 2030   03/13/15 0736  cefUROXime (ZINACEF) injection  Status:  Discontinued       As needed 03/13/15 0737 03/13/15 0936   03/13/15 0700  ceFAZolin (ANCEF) IVPB 2 g/50 mL premix     2 g 100 mL/hr over 30 Minutes Intravenous To [facility] 03/13/15 0551 03/13/15 0732       Patient was given sequential compression devices, early ambulation, and chemoprophylaxis to prevent DVT.  Patient benefited maximally from hospital stay and there were no complications.    Recent vital signs: Patient Vitals for the past 24 hrs:  BP Temp Temp src Pulse Resp SpO2  03/15/15 0559 (!) 114/52 mmHg 98 F (36.7 C) Oral 85 17 96 %  03/14/15 1950 136/64 mmHg 97.9 F (36.6 C) Oral 81 18 95 %  03/14/15 1300 (!) 151/78 mmHg 98.7 F (37.1 C) - 94 18 98 %     Recent laboratory studies:   Recent Labs  03/14/15 0513 03/15/15 0529  WBC 10.8* 10.2  HGB 9.9* 8.9*  HCT 31.8* 28.7*  PLT 206 201  NA 136 139  K 4.3 4.2  CL 103 105  CO2 24 29  BUN 14 14  CREATININE 1.17 1.20  GLUCOSE 134* 106*  CALCIUM 8.8* 8.8*     Discharge Medications:     Medication List    STOP taking these medications        aspirin 81 MG tablet     CAYENNE PO     CHROMIUM PICOLINATE PO     CLA PO     DEANOL ACETAMIDOBENZOATE PO     FISH OIL PO     GINGER ROOT PO     LYCOPENE PO     MILK THISTLE PO     zinc gluconate 50 MG tablet      TAKE these medications        amLODipine 10 MG tablet  Commonly known as:  NORVASC  Take 10 mg by mouth daily.     apixaban 2.5 MG Tabs tablet  Commonly known as:  ELIQUIS  1 tablet every 12 hrs BLOOD THINNER TO PREVENT BLOOD CLOTS     atorvastatin 40 MG tablet  Commonly known as:  LIPITOR  Take 40 mg by mouth daily.     CALCIUM + D PO  Take 1 tablet by mouth daily.     docusate  sodium 100 MG capsule  Commonly known as:  COLACE  1 tab 2 times a day while on narcotics.  STOOL SOFTENER     hydrochlorothiazide 25 MG tablet  Commonly known as:  HYDRODIURIL  Take 25 mg by mouth daily.     multivitamin with minerals tablet  Take 1 tablet by mouth daily.     MYRBETRIQ 50 MG Tb24 tablet  Generic drug:  mirabegron ER  Take 50 mg by mouth daily.     olmesartan 20 MG tablet  Commonly known as:  BENICAR  Take 20 mg by mouth daily.     oxyCODONE 5 MG immediate release tablet  Commonly known as:  Oxy IR/ROXICODONE  1-2 tablets every 4-6 hrs as needed for pain     polyethylene glycol packet  Commonly known as:  MIRALAX / GLYCOLAX  17grams in 16 oz of water twice a day until bowel movement.  LAXITIVE.  Restart if two days since last bowel movement        Diagnostic Studies: Dg Chest 2 View  03/02/2015  CLINICAL DATA:  Left knee arthroplasty.  History of hypertension. EXAM: CHEST  2 VIEW COMPARISON:  None. FINDINGS: The heart  size and mediastinal contours are within normal limits. Both lungs are clear. The visualized skeletal structures are unremarkable. IMPRESSION: No active cardiopulmonary disease. Electronically Signed   By: Kerby Moors M.D.   On: 03/02/2015 14:33    Disposition: Final discharge disposition not confirmed      Discharge Instructions    CPM    Complete by:  As directed   Continuous passive motion machine (CPM):      Use the CPM from 0 to 90 for 6 hours per day.       You may break it up into 2 or 3 sessions per day.      Use CPM for 2 weeks or until you are told to stop.     Call MD / Call 911    Complete by:  As directed   If you experience chest pain or shortness of breath, CALL 911 and be transported to the hospital emergency room.  If you develope a fever above 101 F, pus (white drainage) or increased drainage or redness at the wound, or calf pain, call your surgeon's office.     Change dressing    Complete by:  As directed   Change the gauze dressing daily with sterile 4 x 4 inch gauze and apply TED hose.  DO NOT REMOVE BANDAGE OVER SURGICAL INCISION.  [place] WHOLE LEG INCLUDING OVER THE WATERPROOF BANDAGE WITH SOAP AND WATER EVERY DAY.     Constipation Prevention    Complete by:  As directed   Drink plenty of fluids.  Prune juice may be helpful.  You may use a stool softener, such as Colace (over the counter) 100 mg twice a day.  Use MiraLax (over the counter) for constipation as needed.     Diet - low sodium heart healthy    Complete by:  As directed      Discharge instructions    Complete by:  As directed   INSTRUCTIONS AFTER JOINT REPLACEMENT   Remove items at home which could result in a fall. This includes throw rugs or furniture in walking pathways ICE to the affected joint every three hours while awake for 30 minutes at a time, for at least the first 3-5 days, and then as needed for pain and swelling.  Continue to use ice  for pain and swelling. You may notice swelling that will  progress down to the foot and ankle.  This is normal after surgery.  Elevate your leg when you are not up walking on it.   Continue to use the breathing machine you got in the hospital (incentive spirometer) which will help keep your temperature down.  It is common for your temperature to cycle up and down following surgery, especially at night when you are not up moving around and exerting yourself.  The breathing machine keeps your lungs expanded and your temperature down.   DIET:  As you were doing prior to hospitalization, we recommend a well-balanced diet.  DRESSING / WOUND CARE / SHOWERING  Keep the surgical dressing until follow up.  The dressing is water proof, so you can shower without any extra covering.  IF THE DRESSING FALLS OFF or the wound gets wet inside, change the dressing with sterile gauze.  Please use good hand washing techniques before changing the dressing.  Do not use any lotions or creams on the incision until instructed by your surgeon.    ACTIVITY  Increase activity slowly as tolerated, but follow the weight bearing instructions below.   No driving for 6 weeks or until further direction given by your physician.  You cannot drive while taking narcotics.  No lifting or carrying greater than 10 lbs. until further directed by your surgeon. Avoid periods of inactivity such as sitting longer than an hour when not asleep. This helps prevent blood clots.  You may return to work once you are authorized by your doctor.     WEIGHT BEARING   Weight bearing as tolerated with assist device (walker, cane, etc) as directed, use it as long as suggested by your surgeon or therapist, typically at least 2-3 weeks.   EXERCISES  Results after joint replacement surgery are often greatly improved when you follow the exercise, range of motion and muscle strengthening exercises prescribed by your doctor. Safety measures are also important to protect the joint from further injury. Any time  any of these exercises cause you to have increased pain or swelling, decrease what you are doing until you are comfortable again and then slowly increase them. If you have problems or questions, call your caregiver or physical therapist for advice.   Rehabilitation is important following a joint replacement. After just a few days of immobilization, the muscles of the leg can become weakened and shrink (atrophy).  These exercises are designed to build up the tone and strength of the thigh and leg muscles and to improve motion. Often times heat used for twenty to thirty minutes before working out will loosen up your tissues and help with improving the range of motion but do not use heat for the first two weeks following surgery (sometimes heat can increase post-operative swelling).   These exercises can be done on a training (exercise) mat, on the floor, on a table or on a bed. Use whatever works the best and is most comfortable for you.    Use music or television while you are exercising so that the exercises are a pleasant break in your day. This will make your life better with the exercises acting as a break in your routine that you can look forward to.   Perform all exercises about fifteen times, three times per day or as directed.  You should exercise both the operative leg and the other leg as well.   Exercises include:   Quad Sets -  Tighten up the muscle on the front of the thigh (Quad) and hold for 5-10 seconds.   Straight Leg Raises - With your knee straight (if you were given a brace, keep it on), lift the leg to 60 degrees, hold for 3 seconds, and slowly lower the leg.  Perform this exercise against resistance later as your leg gets stronger.  Leg Slides: Lying on your back, slowly slide your foot toward your buttocks, bending your knee up off the floor (only go as far as is comfortable). Then slowly slide your foot back down until your leg is flat on the floor again.  Angel Wings: Lying on your  back spread your legs to the side as far apart as you can without causing discomfort.  Hamstring Strength:  Lying on your back, push your heel against the floor with your leg straight by tightening up the muscles of your buttocks.  Repeat, but this time bend your knee to a comfortable angle, and push your heel against the floor.  You may put a pillow under the heel to make it more comfortable if necessary.   A rehabilitation program following joint replacement surgery can speed recovery and prevent re-injury in the future due to weakened muscles. Contact your doctor or a physical therapist for more information on knee rehabilitation.    CONSTIPATION  Constipation is defined medically as fewer than three stools per week and severe constipation as less than one stool per week.  Even if you have a regular bowel pattern at home, your normal regimen is likely to be disrupted due to multiple reasons following surgery.  Combination of anesthesia, postoperative narcotics, change in appetite and fluid intake all can affect your bowels.   YOU MUST use at least one of the following options; they are listed in order of increasing strength to get the job done.  They are all available over the counter, and you may need to use some, POSSIBLY even all of these options:    Drink plenty of fluids (prune juice may be helpful) and high fiber foods Colace 100 mg by mouth twice a day  Senokot for constipation as directed and as needed Dulcolax (bisacodyl), take with full glass of water  Miralax (polyethylene glycol) once or twice a day as needed.  If you have tried all these things and are unable to have a bowel movement in the first 3-4 days after surgery call either your surgeon or your primary doctor.    If you experience loose stools or diarrhea, hold the medications until you stool forms back up.  If your symptoms do not get better within 1 week or if they get worse, check with your doctor.  If you experience "the  worst abdominal pain ever" or develop nausea or vomiting, please contact the office immediately for further recommendations for treatment.   ITCHING:  If you experience itching with your medications, try taking only a single pain pill, or even half a pain pill at a time.  You can also use Benadryl over the counter for itching or also to help with sleep.   TED HOSE STOCKINGS:  Use stockings on both legs until for at least 2 weeks or as directed by physician office. They may be removed at night for sleeping.  MEDICATIONS:  See your medication summary on the "After Visit Summary" that nursing will review with you.  You may have some home medications which will be placed on hold until you complete the course of blood thinner  medication.  It is important for you to complete the blood thinner medication as prescribed.  PRECAUTIONS:  If you experience chest pain or shortness of breath - call 911 immediately for transfer to the hospital emergency department.   If you develop a fever greater that 101 F, purulent drainage from wound, increased redness or drainage from wound, foul odor from the wound/dressing, or calf pain - CONTACT YOUR SURGEON.                                                   FOLLOW-UP APPOINTMENTS:  If you do not already have a post-op appointment, please call the office for an appointment to be seen by your surgeon.  Guidelines for how soon to be seen are listed in your "After Visit Summary", but are typically between 1-4 weeks after surgery.  OTHER INSTRUCTIONS:   Knee Replacement:  Do not place pillow under knee, focus on keeping the knee straight while resting. CPM instructions: 0-90 degrees, 2 hours in the morning, 2 hours in the afternoon, and 2 hours in the evening. Place foam block, curve side up under heel at all times except when in CPM or when walking.  DO NOT modify, tear, cut, or change the foam block in any way.  MAKE SURE YOU:  Understand these instructions.  Get help  right away if you are not doing well or get worse.    Thank you for letting us be a part of your medical care team.  It is a privilege we respect greatly.  We hope these instructions will help you stay on track for a fast and full recovery!     Do not put a pillow under the knee. Place it under the heel.    Complete by:  As directed   Place gray foam block, curve side up under heel at all times except when in CPM or when walking.  DO NOT modify, tear, cut, or change in any way the gray foam block.     Increase activity slowly as tolerated    Complete by:  As directed      TED hose    Complete by:  As directed   Use stockings (TED hose) for 2 weeks on both leg(s).  You may remove them at night for sleeping.           Follow-up Information    Follow up with Lorn Junes, MD On 03/27/2015.   Specialty:  Orthopedic Surgery   Why:  APPT TIME 3:15 PM   Contact information:   [place] [address] [phone]       Follow up with [facility] .   Why:  They will contact you to schedule home.    Contact information:   [phone]       Signed: Linda Hedges 03/15/2015, 8:49 AM

## 2019-01-19 ENCOUNTER — Other Ambulatory Visit: Payer: Self-pay | Admitting: Rehabilitation

## 2019-01-19 DIAGNOSIS — M5126 Other intervertebral disc displacement, lumbar region: Secondary | ICD-10-CM

## 2019-01-26 ENCOUNTER — Ambulatory Visit
Admission: RE | Admit: 2019-01-26 | Discharge: 2019-01-26 | Disposition: A | Discharge status: Home / Self Care | Payer: 59 | Source: Ambulatory Visit | Attending: Rehabilitation | Admitting: Rehabilitation

## 2019-01-26 DIAGNOSIS — M5126 Other intervertebral disc displacement, lumbar region: Secondary | ICD-10-CM

## 2020-12-13 ENCOUNTER — Ambulatory Visit: Payer: 59 | Admitting: Orthopedic Surgery

## 2020-12-20 ENCOUNTER — Encounter: Payer: Self-pay | Admitting: Orthopedic Surgery

## 2020-12-20 ENCOUNTER — Ambulatory Visit: Payer: 59 | Admitting: Orthopedic Surgery

## 2020-12-20 ENCOUNTER — Other Ambulatory Visit: Payer: Self-pay

## 2020-12-20 ENCOUNTER — Ambulatory Visit (INDEPENDENT_AMBULATORY_CARE_PROVIDER_SITE_OTHER): Payer: 59

## 2020-12-20 DIAGNOSIS — M25551 Pain in right hip: Secondary | ICD-10-CM

## 2020-12-20 DIAGNOSIS — M25561 Pain in right knee: Secondary | ICD-10-CM

## 2020-12-20 NOTE — Progress Notes (Signed)
Office Visit Note   Patient: Kevin Patterson           Date of Birth: 13-Dec-1955           MRN: [id number] Visit Date: 12/20/2020 Requested by: No referring provider defined for this encounter. PCP: Pcp, No  Subjective: Chief Complaint  Patient presents with   Right Knee - Pain    HPI: Kevin Patterson is a 65 year old patient with right hip and right knee pain.  He has had pain in the right knee for 10 years.  The right hip pain has been more acute in onset several months ago.  He did have a right hip injection with Dr. Patrice Paradise last week.  He does not really report much improvement in his hip pain despite the aspiration of some fluid from the hip at that time.  He has had L4-S1 spinal fusion done last year which is going well.  Had left total knee replacement 5 years ago and has done well with that as well.  He describes sharp pain in the groin with movement along with sharp pain in the knee with movement.  The pain does wake him from sleep at night.  He wants to try to get more balanced with his ambulation since so as to not adversely affect his fusion.  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              ROS: All systems reviewed are negative as they relate to the chief complaint within the history of present illness.  Patient denies  fevers or chills.   Assessment & Plan: Visit Diagnoses:  1. Right knee pain, unspecified chronicity   2. Pain in right hip     Plan: Impression is end-stage arthritis in the right knee.  The varus alignment does make him unbalanced when he walks.  Not too much of a Trendelenburg gait is present.  Hip arthritis may or may not be present but his exam was pretty benign in that regard today.  Needs MRI pelvis to evaluate right versus left hip arthritis and effusion.  Need to evaluate for occult arthritis in the right hip as this groin pain is giving him more symptoms than the knee.  If the scan is unremarkable then consideration needs to be given to  radicular source of pain from the back.  He does need knee replacement at sometime in the future but we need to figure out this hip versus back source of his posterior buttock and groin pain.  Follow-Up Instructions: Return for after MRI.   Orders:  Orders Placed This Encounter  Procedures   XR KNEE 3 VIEW RIGHT   XR HIP UNILAT W OR W/O PELVIS 2-3 VIEWS RIGHT   MR Pelvis w/o contrast   No orders of the defined types were placed in this encounter.     Procedures: No procedures performed   Clinical Data: No additional findings.  Objective: Vital Signs: There were no vitals taken for this visit.  Physical Exam:   Constitutional: Patient appears well-developed HEENT:  Head: Normocephalic Eyes:EOM are normal Neck: Normal range of motion Cardiovascular: Normal rate Pulmonary/chest: Effort normal Neurologic: Patient is alert Skin: Skin is warm Psychiatric: Patient has normal mood and affect   Ortho Exam: Ortho exam demonstrates not too much pain with internal and external rotation of the right and left hip.  He has range of motion on the left of 5 to 100 degrees.  On the right range of  motion is 10 to 100 degrees.  Trace effusion present in the right knee.  Ankle dorsiflexion intact.  He does have an antalgic gait to the right but is not Trendelenburg and still more to the mild to moderate varus deformity in the right knee.  Specialty Comments:  No specialty comments available.  Imaging: XR HIP UNILAT W OR W/O PELVIS 2-3 VIEWS RIGHT  Result Date: 12/20/2020 AP pelvis lateral right hip reviewed.  Mild degenerative changes present in both hips.  No acute fracture.  No significant spurring or signs of femoral acetabular impingement.  Bony pelvis otherwise unremarkable  XR KNEE 3 VIEW RIGHT  Result Date: 12/20/2020 AP lateral merchant radiographs right knee reviewed.  Varus alignment is present.  End-stage medial compartment arthritis is present with moderate arthritis in the  lateral compartment and patellofemoral joint.  No acute fracture.  Bone quality appears good.    PMFS History: Patient Active Problem List   Diagnosis Date Noted   DJD (degenerative joint disease) of knee 03/13/2015   Past Medical History:  Diagnosis Date   Arthritis    Cyst of kidney, acquired    history of , monitored   Hypertension     No family history on file.  Past Surgical History:  Procedure Laterality Date   APPENDECTOMY     KNEE ARTHROTOMY  1970's   open cleanout   TOTAL KNEE ARTHROPLASTY Left 03/13/2015   Procedure: LEFT TOTAL KNEE ARTHROPLASTY;  Surgeon: Elsie Saas, MD;  Location: [facility];  Service: Orthopedics;  Laterality: Left;   Social History   Occupational History   Not on file  Tobacco Use   Smoking status: Former    Types: Cigarettes    Quit date: 04/29/1973    Years since quitting: 47.6   Smokeless tobacco: Not on file  Substance and Sexual Activity   Alcohol use: Yes    Alcohol/week: 3.0 standard drinks    Types: 3 Glasses of wine per week   Drug use: Not on file   Sexual activity: Not on file

## 2020-12-20 NOTE — Progress Notes (Signed)
.  ip

## 2020-12-31 ENCOUNTER — Ambulatory Visit
Admission: RE | Admit: 2020-12-31 | Discharge: 2020-12-31 | Disposition: A | Discharge status: Home / Self Care | Payer: 59 | Source: Ambulatory Visit | Attending: Orthopedic Surgery | Admitting: Orthopedic Surgery

## 2020-12-31 DIAGNOSIS — M25551 Pain in right hip: Secondary | ICD-10-CM

## 2021-01-04 ENCOUNTER — Telehealth: Payer: Self-pay | Admitting: Orthopedic Surgery

## 2021-01-04 NOTE — Telephone Encounter (Signed)
Patient called. He would like the results of his MRI before 9/26. his call back number is [phone]

## 2021-01-08 ENCOUNTER — Ambulatory Visit: Payer: 59 | Admitting: Orthopedic Surgery

## 2021-01-08 ENCOUNTER — Other Ambulatory Visit: Payer: Self-pay

## 2021-01-08 ENCOUNTER — Encounter: Payer: Self-pay | Admitting: Orthopedic Surgery

## 2021-01-08 DIAGNOSIS — M25551 Pain in right hip: Secondary | ICD-10-CM

## 2021-01-14 ENCOUNTER — Encounter: Payer: Self-pay | Admitting: Orthopedic Surgery

## 2021-01-14 NOTE — Progress Notes (Addendum)
Office Visit Note   Patient: Kevin Patterson           Date of Birth: 25-May-1955           MRN: [id number] Visit Date: 01/08/2021 Requested by: Mina Marble, PA-C [place],  [place] [id number] PCP: Drosinis, Pamalee Leyden, PA-C  Subjective: Chief Complaint  Patient presents with   Right Hip - Follow-up    HPI: Kevin Patterson is a 65 year old patient with right hip and leg pain.  Reports increased pain with movement.  He works in Therapist, occupational.  Describes pain in the groin.  When he goes from sitting to standing makes it worse.  When he is walking around the leg straight it is okay.  He does report soreness in the back.  States that his right leg still is numb from prior surgery and his calf is also numb.  His right hip MRI scan is reviewed.  He has 1 cm nonfragmented osteochondral lesion in the right femoral head just cephalad to the fovea.  No fragmentation or significant bone bruising or large effusion is present although there is a small effusion on the right-hand side.  Left sacroiliac joint is fused.  The scan is reviewed with the patient and there is a very small focus of bone marrow edema associated with this lesion but not to the extent which I would expect it to be causing this amount of pain.  Lumbar spine MRI from 2020 is reviewed.  There were some degenerative changes on the right-hand side in the upper lumbar and lower thoracic regions which could account for the patient's symptoms and which were well above the symptomatic surgical level at L4-5.              ROS: All systems reviewed are negative as they relate to the chief complaint within the history of present illness.  Patient denies  fevers or chills.   Assessment & Plan: Visit Diagnoses:  1. Pain in right hip     Plan: Impression is right hip and leg pain.  MRI scan really only partially explains the pain he is having.  For his level of symptoms are typically seen effusions and degenerative changes more  pronounced in the affected hip.  I think it is possible that he may have some adjacent segment disease affecting his right leg and hip above his prior surgery level.  Before considering any type of hip replacement we need to get an MRI of the lumbar spine to evaluate for right-sided hip radiculopathy.  Follow-up after that study.  Follow-Up Instructions: Return for after MRI.   Orders:  Orders Placed This Encounter  Procedures   MR Lumbar Spine w/o contrast   No orders of the defined types were placed in this encounter.     Procedures: No procedures performed   Clinical Data: No additional findings.  Objective: Vital Signs: There were no vitals taken for this visit.  Physical Exam:   Constitutional: Patient appears well-developed HEENT:  Head: Normocephalic Eyes:EOM are normal Neck: Normal range of motion Cardiovascular: Normal rate Pulmonary/chest: Effort normal Neurologic: Patient is alert Skin: Skin is warm Psychiatric: Patient has normal mood and affect   Ortho Exam: Ortho exam demonstrates full active and passive range of motion of the left hip.  The right hip has good range of motion as well with only mild tenderness with internal rotation.  Abduction adduction and hip flexion strength 5+ out of 5 bilaterally.  No nerve root tension  signs.  Does have some numb areas on the lateral calf and leg which is not new on the right-hand side.  Pedal pulses palpable.  There is not a Trendelenburg gait present.  Specialty Comments:  No specialty comments available.  Imaging: No results found.   PMFS History: Patient Active Problem List   Diagnosis Date Noted   DJD (degenerative joint disease) of knee 03/13/2015   Past Medical History:  Diagnosis Date   Arthritis    Cyst of kidney, acquired    history of , monitored   Hypertension     History reviewed. No pertinent family history.  Past Surgical History:  Procedure Laterality Date   APPENDECTOMY     KNEE  ARTHROTOMY  1970's   open cleanout   TOTAL KNEE ARTHROPLASTY Left 03/13/2015   Procedure: LEFT TOTAL KNEE ARTHROPLASTY;  Surgeon: Elsie Saas, MD;  Location: [facility];  Service: Orthopedics;  Laterality: Left;   Social History   Occupational History   Not on file  Tobacco Use   Smoking status: Former    Types: Cigarettes    Quit date: 04/29/1973    Years since quitting: 47.7   Smokeless tobacco: Not on file  Substance and Sexual Activity   Alcohol use: Yes    Alcohol/week: 3.0 standard drinks    Types: 3 Glasses of wine per week   Drug use: Not on file   Sexual activity: Not on file

## 2021-01-17 ENCOUNTER — Ambulatory Visit
Admission: RE | Admit: 2021-01-17 | Discharge: 2021-01-17 | Disposition: A | Discharge status: Home / Self Care | Payer: 59 | Source: Ambulatory Visit | Attending: Orthopedic Surgery | Admitting: Orthopedic Surgery

## 2021-01-17 ENCOUNTER — Other Ambulatory Visit: Payer: Self-pay

## 2021-01-17 DIAGNOSIS — M25551 Pain in right hip: Secondary | ICD-10-CM

## 2021-01-17 NOTE — Progress Notes (Signed)
Pls have him f/u thx

## 2021-01-18 ENCOUNTER — Other Ambulatory Visit: Payer: 59

## 2021-01-22 ENCOUNTER — Ambulatory Visit: Payer: 59 | Admitting: Orthopedic Surgery

## 2021-01-26 ENCOUNTER — Ambulatory Visit: Payer: 59 | Admitting: Surgical

## 2021-01-29 ENCOUNTER — Telehealth: Payer: Self-pay

## 2021-01-29 NOTE — Telephone Encounter (Signed)
Pt called stating that he would like to know if he can get a call regarding the Mri results. Please advise

## 2021-01-30 ENCOUNTER — Telehealth: Payer: Self-pay | Admitting: Orthopedic Surgery

## 2021-01-30 NOTE — Telephone Encounter (Signed)
Patient called  to let Dr Marlou Sa know he is going to take his MRI over to Dr. Rennis Harding at [facility]. Patient said he will follow up with Dr dean after he sees Dr Patrice Paradise. The number to contact patient is [phone]

## 2021-01-30 NOTE — Telephone Encounter (Signed)
I called and left him a message.  If he wants to come in we can talk about it further but essentially my recommendation is that he sees whoever did his back surgery to weigh in on the new findings above the level of the prior fusion.  I would not pursue any intervention on the hip or knee at this time until we get that opinion from his surgeon.  If you could please try to call him later with today and let him know that I left a message thanks

## 2021-01-30 NOTE — Telephone Encounter (Signed)
Patient would like to know if MRI is able to be discussed over the phone or will he need to come into the office to discuss.   MRI L spine 01/17/2021  IMPRESSION: 1. Status post L4 through S1 TLIF, left laminotomy at L4-L5, and decompressive laminectomies at L5-S1 without evidence of complication. 2. Moderate spinal canal stenosis and mild-to-moderate bilateral neural foraminal stenosis at L2-L3, and moderate spinal canal stenosis and severe left worse than right neural foraminal stenosis at L3-L4 have worsened compared to the preoperative study from 2020. There is impingement of the exiting left L3 nerve root and possible impingement of the exiting right L3 nerve root. 3. Probable severe left neural foraminal stenosis at L4-L5 with impingement of the left L4 nerve root, suboptimally assessed due to artifact from the adjacent hardware.  [phone] (home)

## 2021-01-31 NOTE — Telephone Encounter (Signed)
Attempted to contact patient however had to leave a voicemail. Informed patient of Dr. Forbes Cellar recommendations.

## 2021-03-07 ENCOUNTER — Ambulatory Visit: Payer: 59 | Admitting: Orthopedic Surgery

## 2021-03-07 ENCOUNTER — Encounter: Payer: Self-pay | Admitting: Orthopedic Surgery

## 2021-03-07 ENCOUNTER — Other Ambulatory Visit: Payer: Self-pay

## 2021-03-07 DIAGNOSIS — M1711 Unilateral primary osteoarthritis, right knee: Secondary | ICD-10-CM | POA: Diagnosis not present

## 2021-03-10 ENCOUNTER — Encounter: Payer: Self-pay | Admitting: Orthopedic Surgery

## 2021-03-10 NOTE — Progress Notes (Signed)
Office Visit Note   Patient: Kevin Patterson           Date of Birth: October 06, 1955           MRN: [id number] Visit Date: 03/07/2021 Requested by: Mina Marble, PA-C [place],  [place] [id number] PCP: Drosinis, Pamalee Leyden, PA-C  Subjective: Chief Complaint  Patient presents with   Right Knee - Follow-up    HPI: Kevin Patterson is a 65 year old patient with right knee pain.  Since he was last seen he has been seen by his spine surgeon who recommends right knee replacement to help him with his walking.  He has a small osteochondral issue in the right hip but that has improved.  No recent groin pain.  Has history of left total knee replacement and he is walking well from that.  He does have severe right knee arthritis.  Also has left-sided greater than right-sided foraminal stenosis.  Describes diminished walking endurance on that right leg.  His pain waxes and wanes in the right knee.  He would like to get the right knee fixed in order to facilitate better ambulatory ability.              ROS: All systems reviewed are negative as they relate to the chief complaint within the history of present illness.  Patient denies  fevers or chills.   Assessment & Plan: Visit Diagnoses:  1. Primary osteoarthritis of right knee     Plan: Impression is end-stage right knee arthritis with recent evaluation by his spine surgeon demonstrating no definitive overwhelming back source of his symptoms.  He does have pain which primarily focusesThe area between mid thigh distal and mid calf proximal.  Denies any mechanical symptoms in the knee but does report pain.  Discussed the risk and benefits of knee replacement including not limited to infection nerve vessel damage incomplete pain relief as well as potential need for more surgery.  He has had knee replacement on the left-hand side and understands the grueling nature of the rehabilitative process.  All questions answered.   Follow-Up Instructions: No  follow-ups on file.   Orders:  No orders of the defined types were placed in this encounter.  No orders of the defined types were placed in this encounter.     Procedures: No procedures performed   Clinical Data: No additional findings.  Objective: Vital Signs: There were no vitals taken for this visit.  Physical Exam:   Constitutional: Patient appears well-developed HEENT:  Head: Normocephalic Eyes:EOM are normal Neck: Normal range of motion Cardiovascular: Normal rate Pulmonary/chest: Effort normal Neurologic: Patient is alert Skin: Skin is warm Psychiatric: Patient has normal mood and affect  Ortho Exam: Ortho exam demonstrates full active and passive range of motion of the ankle and hip.  No groin pain on the right with that range of motion.  Knee range of motion is diminished to approximately 5 to 100 degrees.  Collateral crucial ligaments are stable.  Mild varus alignment present.  Pedal pulses palpable.  Ankle dorsiflexion intact.  No masses lymphadenopathy or skin changes noted in that right knee region.  No effusion today.  Specialty Comments:  No specialty comments available.  Imaging: No results found.   PMFS History: Patient Active Problem List   Diagnosis Date Noted   DJD (degenerative joint disease) of knee 03/13/2015   Past Medical History:  Diagnosis Date   Arthritis    Cyst of kidney, acquired    history of ,  monitored   Hypertension     History reviewed. No pertinent family history.  Past Surgical History:  Procedure Laterality Date   APPENDECTOMY     KNEE ARTHROTOMY  1970's   open cleanout   TOTAL KNEE ARTHROPLASTY Left 03/13/2015   Procedure: LEFT TOTAL KNEE ARTHROPLASTY;  Surgeon: Elsie Saas, MD;  Location: [facility];  Service: Orthopedics;  Laterality: Left;   Social History   Occupational History   Not on file  Tobacco Use   Smoking status: Former    Types: Cigarettes    Quit date: 04/29/1973    Years since quitting: 47.8    Smokeless tobacco: Not on file  Substance and Sexual Activity   Alcohol use: Yes    Alcohol/week: 3.0 standard drinks    Types: 3 Glasses of wine per week   Drug use: Not on file   Sexual activity: Not on file

## 2021-03-20 ENCOUNTER — Telehealth: Payer: Self-pay | Admitting: Orthopedic Surgery

## 2021-03-20 NOTE — Telephone Encounter (Signed)
Received medical records release form, $50.00 cash and FMLA/Disability paperwork/  Forwarding to [employer] today

## 2021-03-29 ENCOUNTER — Other Ambulatory Visit: Payer: Self-pay

## 2021-04-02 NOTE — Progress Notes (Signed)
Surgical Instructions    Your procedure is scheduled on Tuesday, December 13th, 2022.   Report to [facility] Main Entrance "A" at 05:30 A.M., then check in with the Admitting office.  Call this number if you have problems the morning of surgery:  [phone]   If you have any questions prior to your surgery date call [phone]: Open Monday-Friday 8am-4pm    Remember:  Do not eat after midnight the night before your surgery  You may drink clear liquids until 04:30 the morning of your surgery.   Clear liquids allowed are: Water, Non-Citrus Juices (without pulp), Carbonated Beverages, Clear Tea, Black Coffee ONLY (NO MILK, CREAM OR POWDERED CREAMER of any kind), and Gatorade    Take these medicines the morning of surgery with A SIP OF WATER: NONE    As of today, STOP taking any Aspirin (unless otherwise instructed by your surgeon) Aleve, Naproxen, Ibuprofen, Motrin, Advil, Goody's, BC's, all herbal medications, fish oil, and all vitamins.    After your COVID test   You are not required to quarantine however you are required to wear a well-fitting mask when you are out and around people not in your household.  If your mask becomes wet or soiled, replace with a new one.  Wash your hands often with soap and water for 20 seconds or clean your hands with an alcohol-based hand sanitizer that contains at least 60% alcohol.  Do not share personal items.  Notify your provider: if you are in close contact with someone who has COVID  or if you develop a fever of 100.4 or greater, sneezing, cough, sore throat, shortness of breath or body aches.    The day of surgery:  Do not wear jewelry  Do not wear lotions, powders, colognes, or deodorant. Men may shave face and neck. Do not bring valuables to the hospital.              [facility] is not responsible for any belongings or valuables.  Do NOT Smoke (Tobacco/Vaping)  24 hours prior to your procedure  If you use a CPAP at night,  you may bring your mask for your overnight stay.   Contacts, glasses, hearing aids, dentures or partials may not be worn into surgery, please bring cases for these belongings   For patients admitted to the hospital, discharge time will be determined by your treatment team.   Patients discharged the day of surgery will not be allowed to drive home, and someone needs to stay with them for 24 hours.  NO VISITORS WILL BE ALLOWED IN PRE-OP WHERE PATIENTS ARE PREPPED FOR SURGERY.  ONLY 1 SUPPORT PERSON MAY BE PRESENT IN THE WAITING ROOM WHILE YOU ARE IN SURGERY.  IF YOU ARE TO BE ADMITTED, ONCE YOU ARE IN YOUR ROOM YOU WILL BE ALLOWED TWO (2) VISITORS. 1 (ONE) VISITOR MAY STAY OVERNIGHT BUT MUST ARRIVE TO THE ROOM BY 8pm.  Minor children may have two parents present. Special consideration for safety and communication needs will be reviewed on a case by case basis.  Special instructions:    Oral Hygiene is also important to reduce your risk of infection.  Remember - BRUSH YOUR TEETH THE MORNING OF SURGERY WITH YOUR REGULAR TOOTHPASTE   [facility]- Preparing For Surgery  Before surgery, you can play an important role. Because skin is not sterile, your skin needs to be as free of germs as possible. You can reduce the number of germs on your skin by washing with  CHG (chlorahexidine gluconate) Soap before surgery.  CHG is an antiseptic cleaner which kills germs and bonds with the skin to continue killing germs even after washing.     Please do not use if you have an allergy to CHG or antibacterial soaps. If your skin becomes reddened/irritated stop using the CHG.  Do not shave (including legs and underarms) for at least 48 hours prior to first CHG shower. It is OK to shave your face.  Please follow these instructions carefully.     Shower the NIGHT BEFORE SURGERY and the MORNING OF SURGERY with CHG Soap.   If you chose to wash your hair, wash your hair first as usual with your normal shampoo. After  you shampoo, rinse your hair and body thoroughly to remove the shampoo.  Then [employer] and genitals (private parts) with your normal soap and rinse thoroughly to remove soap.  After that Use CHG Soap as you would any other liquid soap. You can apply CHG directly to the skin and wash gently with a scrungie or a clean washcloth.   Apply the CHG Soap to your body ONLY FROM THE NECK DOWN.  Do not use on open wounds or open sores. Avoid contact with your eyes, ears, mouth and genitals (private parts). Wash Face and genitals (private parts)  with your normal soap.   Wash thoroughly, paying special attention to the area where your surgery will be performed.  Thoroughly rinse your body with warm water from the neck down.  DO NOT shower/wash with your normal soap after using and rinsing off the CHG Soap.  Pat yourself dry with a CLEAN TOWEL.  Wear CLEAN PAJAMAS to bed the night before surgery  Place CLEAN SHEETS on your bed the night before your surgery  DO NOT SLEEP WITH PETS.   Day of Surgery:  Take a shower with CHG soap. Wear Clean/Comfortable clothing the morning of surgery Do not apply any deodorants/lotions.   Remember to brush your teeth WITH YOUR REGULAR TOOTHPASTE.   Please read over the following fact sheets that you were given.

## 2021-04-03 ENCOUNTER — Encounter (HOSPITAL_COMMUNITY): Payer: Self-pay

## 2021-04-03 ENCOUNTER — Other Ambulatory Visit: Payer: Self-pay

## 2021-04-03 ENCOUNTER — Telehealth: Payer: Self-pay | Admitting: Orthopedic Surgery

## 2021-04-03 ENCOUNTER — Encounter (HOSPITAL_COMMUNITY)
Admission: RE | Admit: 2021-04-03 | Discharge: 2021-04-03 | Disposition: A | Discharge status: Home / Self Care | Payer: 59 | Source: Ambulatory Visit | Attending: Orthopedic Surgery | Admitting: Orthopedic Surgery

## 2021-04-03 VITALS — BP 138/85 | HR 72 | Temp 98.8°F | Resp 17 | Ht 74.0 in | Wt 232.5 lb

## 2021-04-03 DIAGNOSIS — Z8616 Personal history of COVID-19: Secondary | ICD-10-CM | POA: Insufficient documentation

## 2021-04-03 DIAGNOSIS — Z87891 Personal history of nicotine dependence: Secondary | ICD-10-CM | POA: Insufficient documentation

## 2021-04-03 DIAGNOSIS — I10 Essential (primary) hypertension: Secondary | ICD-10-CM | POA: Insufficient documentation

## 2021-04-03 DIAGNOSIS — Z01818 Encounter for other preprocedural examination: Secondary | ICD-10-CM | POA: Insufficient documentation

## 2021-04-03 DIAGNOSIS — M1711 Unilateral primary osteoarthritis, right knee: Secondary | ICD-10-CM | POA: Insufficient documentation

## 2021-04-03 LAB — BASIC METABOLIC PANEL
Anion gap: 9 (ref 5–15)
BUN: 13 mg/dL (ref 8–23)
CO2: 25 mmol/L (ref 22–32)
Calcium: 9.3 mg/dL (ref 8.9–10.3)
Chloride: 103 mmol/L (ref 98–111)
Creatinine, Ser: 1.02 mg/dL (ref 0.61–1.24)
GFR, Estimated: >60 mL/min (ref >60)
Glucose, Bld: 94 mg/dL (ref 70–99)
Potassium: 4.9 mmol/L (ref 3.5–5.1)
Sodium: 137 mmol/L (ref 135–145)

## 2021-04-03 LAB — SURGICAL PCR SCREEN
MRSA, PCR: NEGATIVE
Staphylococcus aureus: NEGATIVE

## 2021-04-03 LAB — CBC
HCT: 42.7 % (ref 39.0–52.0)
Hemoglobin: 13.5 g/dL (ref 13.0–17.0)
MCH: 29.4 pg (ref 26.0–34.0)
MCHC: 31.6 g/dL (ref 30.0–36.0)
MCV: 93 fL (ref 80.0–100.0)
Platelets: 185 10*3/uL (ref 150–400)
RBC: 4.59 MIL/uL (ref 4.22–5.81)
RDW: 12.8 % (ref 11.5–15.5)
WBC: 17.9 10*3/uL — ABNORMAL HIGH (ref 4.0–10.5)
nRBC: 0 % (ref 0.0–0.2)

## 2021-04-03 NOTE — Telephone Encounter (Signed)
Kevin Patterson with PAT at [facility] called with patient's lab results.  Hgb is 17.9 and patient's EKG was abnormal.

## 2021-04-03 NOTE — Progress Notes (Signed)
Abnormal lab in PAT: Hgb 17.9. Dr. Randel Pigg office was called and the surgical scheduler was notified.

## 2021-04-03 NOTE — Progress Notes (Addendum)
PCP - Drosinis, Butch Penny, PA-C Cardiologist - denies  PPM/ICD - denies Device Orders - n/a Rep Notified - n/a  Chest x-ray - n/a EKG - 04/03/2021 Stress Test - 01/07/2017 - [facility] ECHO - denies Cardiac Cath - denies  Sleep Study - denies CPAP - n/a  Fasting Blood Sugar - n/a  Blood Thinner Instructions: n/a  Aspirin Instructions: Patient was instructed: As of today, STOP taking any Aspirin (unless otherwise instructed by your surgeon) Aleve, Naproxen, Ibuprofen, Motrin, Advil, Goody's, BC's, all herbal medications, fish oil, and all vitamins.  ERAS Protcol - yes  PRE-SURGERY Ensure or G2- no  COVID TEST- the test was scheduled for 04/09/21 @ 09:30   Anesthesia review: yes - abnormal EKG in PAT; Hgb 17.9 - Dr. Marlou Sa was notified.  Patient denies shortness of breath, fever, cough and chest pain at PAT appointment   All instructions explained to the patient, with a verbal understanding of the material. Patient agrees to go over the instructions while at home for a better understanding. Patient also instructed to self quarantine after being tested for COVID-19. The opportunity to ask questions was provided.

## 2021-04-04 ENCOUNTER — Telehealth: Payer: Self-pay | Admitting: Orthopedic Surgery

## 2021-04-04 ENCOUNTER — Other Ambulatory Visit (HOSPITAL_COMMUNITY): Payer: 59

## 2021-04-04 NOTE — Telephone Encounter (Signed)
Patient scheduled for right total knee 04-10-21.     Message from [place] with [facility]:   Dr. Marlou Sa made a note saying this patient needs a repeat CBC with diff on Monday.  Does the patient know and where is he going to get the repeat CBC?  Please call and advise patient.

## 2021-04-04 NOTE — Progress Notes (Addendum)
Anesthesia Chart Review:  Case: [id number] Date/Time: 04/10/21 1045   Procedure: RIGHT TOTAL KNEE ARTHROPLASTY (Right: Knee)   Anesthesia type: Spinal   Pre-op diagnosis: right knee osteoarthritis   Location: [facility] OR ROOM 06 / [place] [facility]   Surgeons: Meredith Pel, MD       DISCUSSION: Patient is a 65 year old male scheduled for the above procedure.  History includes former smoker (quit 04/29/73), HTN, renal cysts, arthritis (s/p left TKA 03/13/15), spinal surgery (L4-S1 laminectomy/TLIF 03/02/19 by Rennis Harding, MD), TURP (10/25/15), COVID -19 (01/18/20).   WBC 17.9K.  Denied shortness of breath, cough, fever, chest pain per PAT RN interview.  Comparison CBC from 08/11/20 showed WBC 7.6. Dr. Marlou Sa is aware and notes indicate he wants a CBC with differential on 04/09/21.   Presurgical COVID-19 test is scheduled for 04/09/2021.  ADDENDUM 04/09/21 2:21 PM:  Kevin Patterson had a repeat CBC with differential per Ortho today. Results showed continued elevation of WBC 16.7 (previously 17.9 on 04/03/21). Absolute lymphocytes elevated at 7,949 and absolute monocytes at 4,125. Results have been reviewed with Dr. Marlou Sa. They also contacted patient over the weekend to ensure no concerning signs/symptoms of infection. Apparently, labs also reviewed with an oncologist. Orthopedics is going to order UA, urine culture, CBC, and BMET for the morning of surgery--case has been moved to the second case instead of a first case given need for repeat labs. 04/09/21 COVID-19 test was negative.    VS: BP 138/85   Pulse 72   Temp 37.1 C (Oral)   Resp 17   Ht 6\' 2"  (1.88 m)   Wt 105.5 kg   SpO2 98%   BMI 29.85 kg/m    PROVIDERS: Drosinis, Pamalee Leyden, PA-C is listed as PCP ([facility]) Puschinsky, Richard, MD is urologist [facility])   LABS: See DISCUSSION.  (all labs ordered are listed, but only abnormal results are displayed)  Labs Reviewed  CBC - Abnormal; Notable for the following components:      Result Value    WBC 17.9 (*)    All other components within normal limits  SURGICAL PCR SCREEN  BASIC METABOLIC PANEL     IMAGES: MRI L-spine 01/17/21: IMPRESSION: 1. Status post L4 through S1 TLIF, left laminotomy at L4-L5, and decompressive laminectomies at L5-S1 without evidence of complication. 2. Moderate spinal canal stenosis and mild-to-moderate bilateral neural foraminal stenosis at L2-L3, and moderate spinal canal stenosis and severe left worse than right neural foraminal stenosis at L3-L4 have worsened compared to the preoperative study from 2020. There is impingement of the exiting left L3 nerve root and possible impingement of the exiting right L3 nerve root. 3. Probable severe left neural foraminal stenosis at L4-L5 with impingement of the left L4 nerve root, suboptimally assessed due to artifact from the adjacent hardware.    EKG: 04/03/21: Normal sinus rhythm Biatrial enlargement Abnormal ECG - He had inverted p wave in V1-2, and aVL on 03/02/15 tracing as well.    CV: ETT 01/08/17 ([facility]): Normal stress test   Past Medical History:  Diagnosis Date   Arthritis    Cyst of kidney, acquired    history of , monitored   Hypertension     Past Surgical History:  Procedure Laterality Date   APPENDECTOMY     BACK SURGERY     JOINT REPLACEMENT     left knee   KNEE ARTHROTOMY  12/28/1968   open cleanout   TONSILLECTOMY     TOTAL KNEE ARTHROPLASTY Left 03/13/2015  Procedure: LEFT TOTAL KNEE ARTHROPLASTY;  Surgeon: Elsie Saas, MD;  Location: [facility];  Service: Orthopedics;  Laterality: Left;    MEDICATIONS:  Ascorbic Acid (VITAMIN C) 1000 MG tablet   Ashwagandha 500 MG CAPS   Calcium Citrate-Vitamin D (CALCIUM + D PO)   cholecalciferol (VITAMIN D3) 25 MCG (1000 UNIT) tablet   Flaxseed, Linseed, (FLAXSEED OIL) 1000 MG CAPS   L-Arginine 500 MG CAPS   milk thistle 175 MG tablet   Multiple Vitamins-Minerals (MULTIVITAMIN WITH MINERALS) tablet    olmesartan-hydrochlorothiazide (BENICAR HCT) 20-12.5 MG tablet   OVER THE COUNTER MEDICATION   tadalafil (CIALIS) 5 MG tablet   Vitamin A 2400 MCG (8000 UT) CAPS   zinc gluconate 50 MG tablet   No current facility-administered medications for this encounter.    Myra Gianotti, PA-C Surgical Short Stay/Anesthesiology [facility] Phone [phone] [facility] Phone [phone] 04/04/2021 4:50 PM

## 2021-04-04 NOTE — Progress Notes (Signed)
Needs repeat cbc diff Monday     wbc not hgb elevated

## 2021-04-05 NOTE — Telephone Encounter (Signed)
scheduled

## 2021-04-05 NOTE — Telephone Encounter (Signed)
Per Dr Marlou Sa patient not notified. I will call patient and get them to come in on Friday for labs only

## 2021-04-06 ENCOUNTER — Other Ambulatory Visit: Payer: Self-pay

## 2021-04-06 ENCOUNTER — Ambulatory Visit: Payer: 59

## 2021-04-06 DIAGNOSIS — M1711 Unilateral primary osteoarthritis, right knee: Secondary | ICD-10-CM

## 2021-04-07 LAB — CBC WITH DIFFERENTIAL/PLATELET
Absolute Monocytes: 4125 cells/uL — ABNORMAL HIGH (ref 200–950)
Basophils Absolute: 67 cells/uL (ref 0–200)
Basophils Relative: 0.4 %
Eosinophils Absolute: 84 cells/uL (ref 15–500)
Eosinophils Relative: 0.5 %
HCT: 43.7 % (ref 38.5–50.0)
Hemoglobin: 13.8 g/dL (ref 13.2–17.1)
Lymphs Abs: 7949 cells/uL — ABNORMAL HIGH (ref 850–3900)
MCH: 28.8 pg (ref 27.0–33.0)
MCHC: 31.6 g/dL — ABNORMAL LOW (ref 32.0–36.0)
MCV: 91 fL (ref 80.0–100.0)
MPV: 12.7 fL — ABNORMAL HIGH (ref 7.5–12.5)
Monocytes Relative: 24.7 %
Neutro Abs: 4476 cells/uL (ref 1500–7800)
Neutrophils Relative %: 26.8 %
Platelets: 201 10*3/uL (ref 140–400)
RBC: 4.8 10*6/uL (ref 4.20–5.80)
RDW: 11.8 % (ref 11.0–15.0)
Total Lymphocyte: 47.6 %
WBC: 16.7 10*3/uL — ABNORMAL HIGH (ref 3.8–10.8)

## 2021-04-07 LAB — EXTRA SPECIMEN

## 2021-04-09 ENCOUNTER — Telehealth: Payer: Self-pay | Admitting: Orthopedic Surgery

## 2021-04-09 ENCOUNTER — Other Ambulatory Visit (HOSPITAL_COMMUNITY)
Admission: RE | Admit: 2021-04-09 | Discharge: 2021-04-09 | Disposition: A | Discharge status: Home / Self Care | Payer: 59 | Source: Ambulatory Visit | Attending: Orthopedic Surgery | Admitting: Orthopedic Surgery

## 2021-04-09 DIAGNOSIS — Z20822 Contact with and (suspected) exposure to covid-19: Secondary | ICD-10-CM | POA: Diagnosis not present

## 2021-04-09 DIAGNOSIS — Z01812 Encounter for preprocedural laboratory examination: Secondary | ICD-10-CM | POA: Diagnosis present

## 2021-04-09 DIAGNOSIS — Z01818 Encounter for other preprocedural examination: Secondary | ICD-10-CM

## 2021-04-09 LAB — SARS CORONAVIRUS 2 (TAT 6-24 HRS): SARS Coronavirus 2: NEGATIVE

## 2021-04-09 MED ORDER — TRANEXAMIC ACID 1000 MG/10ML IV SOLN
2000.0000 mg | INTRAVENOUS | Status: AC
  Administered 2021-04-10: 2000 mg via TOPICAL
  Filled 2021-04-09: qty 20

## 2021-04-09 NOTE — Anesthesia Preprocedure Evaluation (Addendum)
Anesthesia Evaluation    Reviewed: Allergy & Precautions, Patient's Chart, lab work & pertinent test results  Airway Mallampati: II  TM Distance: >3 FB Neck ROM: Full    Dental  (+) Poor Dentition, Dental Advisory Given   Pulmonary neg pulmonary ROS, former smoker,    Pulmonary exam normal        Cardiovascular hypertension, Pt. on medications Normal cardiovascular exam  ETT 01/08/17 ([facility]): Normal stress test   Neuro/Psych negative neurological ROS     GI/Hepatic negative GI ROS, Neg liver ROS,   Endo/Other  negative endocrine ROS  Renal/GU negative Renal ROS     Musculoskeletal  (+) Arthritis ,   Abdominal   Peds  Hematology negative hematology ROS (+)   Anesthesia Other Findings   Reproductive/Obstetrics                            Anesthesia Physical Anesthesia Plan  ASA: 2  Anesthesia Plan: Spinal   Post-op Pain Management: Regional block, Tylenol PO (pre-op) and Celebrex PO (pre-op)   Induction:   PONV Risk Score and Plan: 2 and Ondansetron, Dexamethasone and Propofol infusion  Airway Management Planned: Natural Airway  Additional Equipment:   Intra-op Plan:   Post-operative Plan:   Informed Consent: I have reviewed the patients History and Physical, chart, labs and discussed the procedure including the risks, benefits and alternatives for the proposed anesthesia with the patient or authorized representative who has indicated his/her understanding and acceptance.     Dental advisory given  Plan Discussed with: Anesthesiologist and CRNA  Anesthesia Plan Comments:        Anesthesia Quick Evaluation

## 2021-04-09 NOTE — Progress Notes (Signed)
Lmom sun and tried to call now

## 2021-04-09 NOTE — Telephone Encounter (Signed)
He's okay for labs tomorrow.  Spoke with Dr Marlou Sa over the weekend. Spoke with oncology about his labs and we will have to add a specific lab to his blood work tomorrow.  No symptoms of infection when I called him over the weekend.  Nothing to restrict him from having his surgery on Tuesday per Dr Marlou Sa and oncology.  Also, orders are currently in for preop lab work

## 2021-04-09 NOTE — Telephone Encounter (Signed)
Kevin Patterson with Short Stay contacting us regarding this patient:    WBC was still elevated with elevated lymphocytes and monocytes. He is for right TKA with Marlou Sa tomorrow and needed the new labs today.  Need response ASAP.

## 2021-04-09 NOTE — Progress Notes (Signed)
I talked to Kevin Patterson over the weekend.  In general his white count is elevated.  He did have some burning with urination that lasted 3 days last week but no fevers or chills and it resolved on its own.  His white count is elevated on repeat draw.  I had talked to hematologist Dr. Earlie Server over the weekend.  He recommended flow cytometry for CLL after reviewing the numbers.  No contraindication to surgical intervention at this time in terms of elective surgery.  We will switch the times and draw a urinalysis prior to the surgery tomorrow.

## 2021-04-10 ENCOUNTER — Ambulatory Visit (HOSPITAL_COMMUNITY): Payer: 59 | Admitting: Anesthesiology

## 2021-04-10 ENCOUNTER — Observation Stay (HOSPITAL_COMMUNITY)
Admission: RE | Admit: 2021-04-10 | Discharge: 2021-04-11 | Disposition: A | Discharge status: Home / Self Care | Payer: 59 | Attending: Orthopedic Surgery | Admitting: Orthopedic Surgery

## 2021-04-10 ENCOUNTER — Ambulatory Visit (HOSPITAL_COMMUNITY): Payer: 59 | Admitting: Vascular Surgery

## 2021-04-10 ENCOUNTER — Other Ambulatory Visit: Payer: Self-pay

## 2021-04-10 ENCOUNTER — Encounter (HOSPITAL_COMMUNITY): Payer: Self-pay | Admitting: Orthopedic Surgery

## 2021-04-10 ENCOUNTER — Encounter (HOSPITAL_COMMUNITY)
Admission: RE | Disposition: A | Discharge status: Home / Self Care | Payer: Self-pay | Source: Home / Self Care | Attending: Orthopedic Surgery

## 2021-04-10 DIAGNOSIS — R309 Painful micturition, unspecified: Secondary | ICD-10-CM | POA: Insufficient documentation

## 2021-04-10 DIAGNOSIS — Z87891 Personal history of nicotine dependence: Secondary | ICD-10-CM | POA: Insufficient documentation

## 2021-04-10 DIAGNOSIS — Z96652 Presence of left artificial knee joint: Secondary | ICD-10-CM | POA: Insufficient documentation

## 2021-04-10 DIAGNOSIS — M1711 Unilateral primary osteoarthritis, right knee: Principal | ICD-10-CM

## 2021-04-10 DIAGNOSIS — I10 Essential (primary) hypertension: Secondary | ICD-10-CM | POA: Insufficient documentation

## 2021-04-10 DIAGNOSIS — Z01818 Encounter for other preprocedural examination: Secondary | ICD-10-CM

## 2021-04-10 DIAGNOSIS — Z96651 Presence of right artificial knee joint: Secondary | ICD-10-CM

## 2021-04-10 HISTORY — PX: TOTAL KNEE ARTHROPLASTY: SHX125

## 2021-04-10 LAB — URINALYSIS, ROUTINE W REFLEX MICROSCOPIC
Bilirubin Urine: NEGATIVE
Glucose, UA: NEGATIVE mg/dL
Ketones, ur: NEGATIVE mg/dL
Leukocytes,Ua: NEGATIVE
Nitrite: NEGATIVE
Protein, ur: NEGATIVE mg/dL
Specific Gravity, Urine: 1.01 (ref 1.005–1.030)
pH: 6 (ref 5.0–8.0)

## 2021-04-10 LAB — BASIC METABOLIC PANEL
Anion gap: 6 (ref 5–15)
BUN: 13 mg/dL (ref 8–23)
CO2: 23 mmol/L (ref 22–32)
Calcium: 9.2 mg/dL (ref 8.9–10.3)
Chloride: 106 mmol/L (ref 98–111)
Creatinine, Ser: 1 mg/dL (ref 0.61–1.24)
GFR, Estimated: >60 mL/min (ref >60)
Glucose, Bld: 102 mg/dL — ABNORMAL HIGH (ref 70–99)
Potassium: 4.4 mmol/L (ref 3.5–5.1)
Sodium: 135 mmol/L (ref 135–145)

## 2021-04-10 LAB — CBC
HCT: 40.8 % (ref 39.0–52.0)
Hemoglobin: 12.9 g/dL — ABNORMAL LOW (ref 13.0–17.0)
MCH: 29.1 pg (ref 26.0–34.0)
MCHC: 31.6 g/dL (ref 30.0–36.0)
MCV: 91.9 fL (ref 80.0–100.0)
Platelets: 180 10*3/uL (ref 150–400)
RBC: 4.44 MIL/uL (ref 4.22–5.81)
RDW: 12.7 % (ref 11.5–15.5)
WBC: 16.7 10*3/uL — ABNORMAL HIGH (ref 4.0–10.5)
nRBC: 0 % (ref 0.0–0.2)

## 2021-04-10 LAB — URINALYSIS, MICROSCOPIC (REFLEX)
Bacteria, UA: NONE SEEN
Squamous Epithelial / HPF: NONE SEEN (ref 0–5)

## 2021-04-10 LAB — SURGICAL PATHOLOGY

## 2021-04-10 SURGERY — ARTHROPLASTY, KNEE, TOTAL
Anesthesia: Spinal | Site: Knee | Laterality: Right

## 2021-04-10 MED ORDER — BUPIVACAINE IN DEXTROSE 0.75-8.25 % IT SOLN
INTRATHECAL | Status: DC | PRN
  Administered 2021-04-10: 1.8 mL via INTRATHECAL

## 2021-04-10 MED ORDER — GLYCOPYRROLATE PF 0.2 MG/ML IJ SOSY
PREFILLED_SYRINGE | INTRAMUSCULAR | Status: DC | PRN
  Administered 2021-04-10: .2 mg via INTRAVENOUS

## 2021-04-10 MED ORDER — ROPIVACAINE HCL 7.5 MG/ML IJ SOLN
INTRAMUSCULAR | Status: DC | PRN
  Administered 2021-04-10: 20 mL via PERINEURAL

## 2021-04-10 MED ORDER — METOCLOPRAMIDE HCL 5 MG/ML IJ SOLN: 5.0000 mg | Freq: Three times a day (TID) | INTRAMUSCULAR | Status: DC | PRN

## 2021-04-10 MED ORDER — FENTANYL CITRATE (PF) 250 MCG/5ML IJ SOLN
INTRAMUSCULAR | Status: AC
  Filled 2021-04-10: qty 5

## 2021-04-10 MED ORDER — ACETAMINOPHEN 500 MG PO TABS
1000.0000 mg | ORAL_TABLET | Freq: Four times a day (QID) | ORAL | Status: AC
  Administered 2021-04-10 – 2021-04-11 (×4): 1000 mg via ORAL
  Filled 2021-04-10 (×4): qty 2

## 2021-04-10 MED ORDER — TRANEXAMIC ACID-NACL 1000-0.7 MG/100ML-% IV SOLN
1000.0000 mg | INTRAVENOUS | Status: AC
  Administered 2021-04-10: 1000 mg via INTRAVENOUS
  Filled 2021-04-10: qty 100

## 2021-04-10 MED ORDER — ONDANSETRON HCL 4 MG/2ML IJ SOLN: 4.0000 mg | Freq: Four times a day (QID) | INTRAMUSCULAR | Status: DC | PRN

## 2021-04-10 MED ORDER — POVIDONE-IODINE 10 % EX SWAB: 2.0000 "application " | Freq: Once | CUTANEOUS | Status: DC

## 2021-04-10 MED ORDER — FENTANYL CITRATE (PF) 100 MCG/2ML IJ SOLN
INTRAMUSCULAR | Status: AC
  Administered 2021-04-10: 100 ug via INTRAVENOUS
  Filled 2021-04-10: qty 2

## 2021-04-10 MED ORDER — OXYCODONE HCL 5 MG PO TABS
5.0000 mg | ORAL_TABLET | ORAL | Status: DC | PRN
  Administered 2021-04-10 – 2021-04-11 (×4): 10 mg via ORAL
  Filled 2021-04-10: qty 1
  Filled 2021-04-10 (×4): qty 2

## 2021-04-10 MED ORDER — ONDANSETRON HCL 4 MG PO TABS: 4.0000 mg | ORAL_TABLET | Freq: Four times a day (QID) | ORAL | Status: DC | PRN

## 2021-04-10 MED ORDER — SODIUM CHLORIDE 0.9% FLUSH
INTRAVENOUS | Status: DC | PRN
  Administered 2021-04-10 (×2): 10 mL

## 2021-04-10 MED ORDER — HYDROCHLOROTHIAZIDE 12.5 MG PO TABS
12.5000 mg | ORAL_TABLET | Freq: Every day | ORAL | Status: DC
  Administered 2021-04-10 – 2021-04-11 (×2): 12.5 mg via ORAL
  Filled 2021-04-10 (×2): qty 1

## 2021-04-10 MED ORDER — CEFAZOLIN SODIUM-DEXTROSE 2-4 GM/100ML-% IV SOLN
2.0000 g | Freq: Three times a day (TID) | INTRAVENOUS | Status: AC
  Administered 2021-04-10 – 2021-04-11 (×2): 2 g via INTRAVENOUS
  Filled 2021-04-10 (×2): qty 100

## 2021-04-10 MED ORDER — ASPIRIN 81 MG PO CHEW
81.0000 mg | CHEWABLE_TABLET | Freq: Two times a day (BID) | ORAL | Status: DC
  Administered 2021-04-10 – 2021-04-11 (×2): 81 mg via ORAL
  Filled 2021-04-10 (×2): qty 1

## 2021-04-10 MED ORDER — VANCOMYCIN HCL 1000 MG IV SOLR
INTRAVENOUS | Status: AC
  Filled 2021-04-10: qty 20

## 2021-04-10 MED ORDER — IRBESARTAN 150 MG PO TABS
150.0000 mg | ORAL_TABLET | Freq: Every day | ORAL | Status: DC
  Filled 2021-04-10 (×2): qty 1

## 2021-04-10 MED ORDER — PROPOFOL 10 MG/ML IV BOLUS
INTRAVENOUS | Status: AC
  Filled 2021-04-10: qty 20

## 2021-04-10 MED ORDER — MIDAZOLAM HCL 2 MG/2ML IJ SOLN
INTRAMUSCULAR | Status: DC | PRN
  Administered 2021-04-10 (×2): 1 mg via INTRAVENOUS

## 2021-04-10 MED ORDER — DOCUSATE SODIUM 100 MG PO CAPS
100.0000 mg | ORAL_CAPSULE | Freq: Two times a day (BID) | ORAL | Status: DC
  Administered 2021-04-10 – 2021-04-11 (×2): 100 mg via ORAL
  Filled 2021-04-10 (×2): qty 1

## 2021-04-10 MED ORDER — MIDAZOLAM HCL 2 MG/2ML IJ SOLN: 2.0000 mg | Freq: Once | INTRAMUSCULAR | Status: AC

## 2021-04-10 MED ORDER — LACTATED RINGERS IV SOLN: INTRAVENOUS | Status: DC

## 2021-04-10 MED ORDER — DEXAMETHASONE SODIUM PHOSPHATE 10 MG/ML IJ SOLN
INTRAMUSCULAR | Status: DC | PRN
  Administered 2021-04-10: 5 mg

## 2021-04-10 MED ORDER — OLMESARTAN MEDOXOMIL-HCTZ 20-12.5 MG PO TABS: 1.0000 | ORAL_TABLET | Freq: Every day | ORAL | Status: DC

## 2021-04-10 MED ORDER — GLYCOPYRROLATE PF 0.2 MG/ML IJ SOSY
PREFILLED_SYRINGE | INTRAMUSCULAR | Status: AC
  Filled 2021-04-10: qty 1

## 2021-04-10 MED ORDER — ORAL CARE MOUTH RINSE: 15.0000 mL | Freq: Once | OROMUCOSAL | Status: AC

## 2021-04-10 MED ORDER — CLONIDINE HCL (ANALGESIA) 100 MCG/ML EP SOLN
EPIDURAL | Status: AC
  Filled 2021-04-10: qty 10

## 2021-04-10 MED ORDER — IRRISEPT - 450ML BOTTLE WITH 0.05% CHG IN STERILE WATER, USP 99.95% OPTIME
TOPICAL | Status: DC | PRN
  Administered 2021-04-10: 450 mL

## 2021-04-10 MED ORDER — HYDROMORPHONE HCL 1 MG/ML IJ SOLN
0.5000 mg | INTRAMUSCULAR | Status: DC | PRN
  Administered 2021-04-10 – 2021-04-11 (×4): 0.5 mg via INTRAVENOUS
  Filled 2021-04-10 (×4): qty 0.5

## 2021-04-10 MED ORDER — BUPIVACAINE LIPOSOME 1.3 % IJ SUSP
INTRAMUSCULAR | Status: AC
  Filled 2021-04-10: qty 20

## 2021-04-10 MED ORDER — MORPHINE SULFATE (PF) 4 MG/ML IV SOLN
INTRAVENOUS | Status: AC
  Filled 2021-04-10: qty 2

## 2021-04-10 MED ORDER — METOCLOPRAMIDE HCL 5 MG PO TABS: 5.0000 mg | ORAL_TABLET | Freq: Three times a day (TID) | ORAL | Status: DC | PRN

## 2021-04-10 MED ORDER — SODIUM CHLORIDE 0.9 % IR SOLN
Status: DC | PRN
  Administered 2021-04-10: 3000 mL

## 2021-04-10 MED ORDER — ACETAMINOPHEN 325 MG PO TABS: 325.0000 mg | ORAL_TABLET | Freq: Four times a day (QID) | ORAL | Status: DC | PRN

## 2021-04-10 MED ORDER — BUPIVACAINE HCL (PF) 0.25 % IJ SOLN
INTRAMUSCULAR | Status: AC
  Filled 2021-04-10: qty 30

## 2021-04-10 MED ORDER — CHLORHEXIDINE GLUCONATE 0.12 % MT SOLN
15.0000 mL | Freq: Once | OROMUCOSAL | Status: AC
  Administered 2021-04-10: 15 mL via OROMUCOSAL
  Filled 2021-04-10: qty 15

## 2021-04-10 MED ORDER — METHOCARBAMOL 1000 MG/10ML IJ SOLN
500.0000 mg | Freq: Four times a day (QID) | INTRAVENOUS | Status: DC | PRN
  Filled 2021-04-10: qty 5

## 2021-04-10 MED ORDER — CEFAZOLIN SODIUM-DEXTROSE 2-4 GM/100ML-% IV SOLN
2.0000 g | INTRAVENOUS | Status: AC
  Administered 2021-04-10: 2 g via INTRAVENOUS
  Filled 2021-04-10: qty 100

## 2021-04-10 MED ORDER — ADULT MULTIVITAMIN W/MINERALS CH
1.0000 | ORAL_TABLET | Freq: Every day | ORAL | Status: DC
  Administered 2021-04-11: 09:00:00 1 via ORAL
  Filled 2021-04-10: qty 1

## 2021-04-10 MED ORDER — BUPIVACAINE LIPOSOME 1.3 % IJ SUSP
INTRAMUSCULAR | Status: DC | PRN
  Administered 2021-04-10: 20 mL

## 2021-04-10 MED ORDER — VANCOMYCIN HCL 1000 MG IV SOLR
INTRAVENOUS | Status: DC | PRN
  Administered 2021-04-10: 1000 mg via TOPICAL

## 2021-04-10 MED ORDER — POVIDONE-IODINE 7.5 % EX SOLN
Freq: Once | CUTANEOUS | Status: DC
  Filled 2021-04-10: qty 118

## 2021-04-10 MED ORDER — CLONIDINE HCL (ANALGESIA) 100 MCG/ML EP SOLN
EPIDURAL | Status: DC | PRN
  Administered 2021-04-10: 1 mL

## 2021-04-10 MED ORDER — PHENYLEPHRINE HCL-NACL 20-0.9 MG/250ML-% IV SOLN
INTRAVENOUS | Status: DC | PRN
  Administered 2021-04-10: 40 ug/min via INTRAVENOUS

## 2021-04-10 MED ORDER — MIDAZOLAM HCL 2 MG/2ML IJ SOLN
INTRAMUSCULAR | Status: AC
  Administered 2021-04-10: 2 mg via INTRAVENOUS
  Filled 2021-04-10: qty 2

## 2021-04-10 MED ORDER — BUPIVACAINE HCL 0.25 % IJ SOLN
INTRAMUSCULAR | Status: DC | PRN
  Administered 2021-04-10: 30 mL

## 2021-04-10 MED ORDER — METHOCARBAMOL 500 MG PO TABS: 500.0000 mg | ORAL_TABLET | Freq: Four times a day (QID) | ORAL | Status: DC | PRN

## 2021-04-10 MED ORDER — FENTANYL CITRATE (PF) 250 MCG/5ML IJ SOLN
INTRAMUSCULAR | Status: DC | PRN
  Administered 2021-04-10: 50 ug via INTRAVENOUS

## 2021-04-10 MED ORDER — MORPHINE SULFATE (PF) 4 MG/ML IV SOLN
INTRAVENOUS | Status: DC | PRN
  Administered 2021-04-10 (×2): 4 mg via INTRAMUSCULAR

## 2021-04-10 MED ORDER — MIDAZOLAM HCL 2 MG/2ML IJ SOLN
INTRAMUSCULAR | Status: AC
  Filled 2021-04-10: qty 2

## 2021-04-10 MED ORDER — PROPOFOL 500 MG/50ML IV EMUL
INTRAVENOUS | Status: DC | PRN
  Administered 2021-04-10: 100 ug/kg/min via INTRAVENOUS

## 2021-04-10 MED ORDER — FENTANYL CITRATE (PF) 100 MCG/2ML IJ SOLN: 100.0000 ug | Freq: Once | INTRAMUSCULAR | Status: AC

## 2021-04-10 MED ORDER — 0.9 % SODIUM CHLORIDE (POUR BTL) OPTIME
TOPICAL | Status: DC | PRN
  Administered 2021-04-10 (×4): 1000 mL

## 2021-04-10 MED ORDER — PHENOL 1.4 % MT LIQD: 1.0000 | OROMUCOSAL | Status: DC | PRN

## 2021-04-10 MED ORDER — MENTHOL 3 MG MT LOZG: 1.0000 | LOZENGE | OROMUCOSAL | Status: DC | PRN

## 2021-04-10 SURGICAL SUPPLY — 78 items
BAG COUNTER SPONGE SURGICOUNT (BAG) ×2 IMPLANT
BAG DECANTER FOR FLEXI CONT (MISCELLANEOUS) ×2 IMPLANT
BANDAGE ESMARK 6X9 LF (GAUZE/BANDAGES/DRESSINGS) ×1 IMPLANT
BLADE SAG 18X100X1.27 (BLADE) ×2 IMPLANT
BLADE SAGITTAL (BLADE) ×2
BLADE SAW THK.89X75X18XSGTL (BLADE) ×1 IMPLANT
BNDG COHESIVE 6X5 TAN STRL LF (GAUZE/BANDAGES/DRESSINGS) ×2 IMPLANT
BNDG ELASTIC 6X15 VLCR STRL LF (GAUZE/BANDAGES/DRESSINGS) ×2 IMPLANT
BNDG ESMARK 6X9 LF (GAUZE/BANDAGES/DRESSINGS) ×2
BOWL SMART MIX CTS (DISPOSABLE) IMPLANT
CNTNR URN SCR LID CUP LEK RST (MISCELLANEOUS) ×1 IMPLANT
COMPONENT TRI CR FEM SZ7 KNEE (Orthopedic Implant) IMPLANT
CONT SPEC 4OZ STRL OR WHT (MISCELLANEOUS) ×2
COVER SURGICAL LIGHT HANDLE (MISCELLANEOUS) ×2 IMPLANT
CUFF TOURN SGL QUICK 34 (TOURNIQUET CUFF) ×2
CUFF TOURN SGL QUICK 42 (TOURNIQUET CUFF) IMPLANT
CUFF TRNQT CYL 34X4.125X (TOURNIQUET CUFF) ×1 IMPLANT
DECANTER SPIKE VIAL GLASS SM (MISCELLANEOUS) ×2 IMPLANT
DRAPE INCISE IOBAN 66X45 STRL (DRAPES) IMPLANT
DRAPE ORTHO SPLIT 77X108 STRL (DRAPES) ×6
DRAPE SURG ORHT 6 SPLT 77X108 (DRAPES) ×3 IMPLANT
DRAPE U-SHAPE 47X51 STRL (DRAPES) ×2 IMPLANT
DRSG AQUACEL AG ADV 3.5X10 (GAUZE/BANDAGES/DRESSINGS) ×1 IMPLANT
DRSG AQUACEL AG ADV 3.5X14 (GAUZE/BANDAGES/DRESSINGS) IMPLANT
DURAPREP 26ML APPLICATOR (WOUND CARE) ×4 IMPLANT
ELECT CAUTERY BLADE 6.4 (BLADE) ×2 IMPLANT
ELECT REM PT RETURN 9FT ADLT (ELECTROSURGICAL) ×2
ELECTRODE REM PT RTRN 9FT ADLT (ELECTROSURGICAL) ×1 IMPLANT
GAUZE SPONGE 4X4 12PLY STRL (GAUZE/BANDAGES/DRESSINGS) ×1 IMPLANT
GLOVE SRG 8 PF TXTR STRL LF DI (GLOVE) ×1 IMPLANT
GLOVE SURG ENC MOIS LTX SZ6.5 (GLOVE) ×6 IMPLANT
GLOVE SURG LTX SZ8 (GLOVE) ×2 IMPLANT
GLOVE SURG UNDER POLY LF SZ7 (GLOVE) ×2 IMPLANT
GLOVE SURG UNDER POLY LF SZ8 (GLOVE) ×2
GOWN STRL REUS W/ TWL LRG LVL3 (GOWN DISPOSABLE) ×3 IMPLANT
GOWN STRL REUS W/TWL LRG LVL3 (GOWN DISPOSABLE) ×6
HANDPIECE INTERPULSE COAX TIP (DISPOSABLE) ×2
HOOD PEEL AWAY FLYTE STAYCOOL (MISCELLANEOUS) ×6 IMPLANT
IMMOBILIZER KNEE 20 (SOFTGOODS)
IMMOBILIZER KNEE 20 THIGH 36 (SOFTGOODS) IMPLANT
IMMOBILIZER KNEE 22 UNIV (SOFTGOODS) IMPLANT
IMMOBILIZER KNEE 24 THIGH 36 (MISCELLANEOUS) IMPLANT
IMMOBILIZER KNEE 24 UNIV (MISCELLANEOUS)
INSERT TIB TRIATH X3 7X13 (Insert) ×1 IMPLANT
KIT BASIN OR (CUSTOM PROCEDURE TRAY) ×2 IMPLANT
KIT TURNOVER KIT B (KITS) ×2 IMPLANT
KNEE TIBIAL COMPONENT SZ7 (Knees) ×1 IMPLANT
MANIFOLD NEPTUNE II (INSTRUMENTS) ×2 IMPLANT
NDL SPNL 18GX3.5 QUINCKE PK (NEEDLE) ×1 IMPLANT
NEEDLE 22X1 1/2 (OR ONLY) (NEEDLE) ×4 IMPLANT
NEEDLE SPNL 18GX3.5 QUINCKE PK (NEEDLE) ×2 IMPLANT
NS IRRIG 1000ML POUR BTL (IV SOLUTION) ×6 IMPLANT
PACK TOTAL JOINT (CUSTOM PROCEDURE TRAY) ×2 IMPLANT
PAD ARMBOARD 7.5X6 YLW CONV (MISCELLANEOUS) ×4 IMPLANT
PAD CAST 4YDX4 CTTN HI CHSV (CAST SUPPLIES) ×1 IMPLANT
PADDING CAST COTTON 4X4 STRL (CAST SUPPLIES)
PADDING CAST COTTON 6X4 STRL (CAST SUPPLIES) ×3 IMPLANT
PATELLA ASYMMETRIC 38X11 KNEE (Orthopedic Implant) ×1 IMPLANT
PIN FLUTED HEDLESS FIX 3.5X1/8 (PIN) ×1 IMPLANT
SET HNDPC FAN SPRY TIP SCT (DISPOSABLE) ×1 IMPLANT
SPONGE T-LAP 18X18 ~~LOC~~+RFID (SPONGE) ×3 IMPLANT
STRIP CLOSURE SKIN 1/2X4 (GAUZE/BANDAGES/DRESSINGS) ×4 IMPLANT
SUCTION FRAZIER HANDLE 10FR (MISCELLANEOUS) ×2
SUCTION TUBE FRAZIER 10FR DISP (MISCELLANEOUS) ×1 IMPLANT
SUT MNCRL AB 3-0 PS2 18 (SUTURE) ×2 IMPLANT
SUT VIC AB 0 CT1 27 (SUTURE) ×6
SUT VIC AB 0 CT1 27XBRD ANBCTR (SUTURE) ×3 IMPLANT
SUT VIC AB 1 CT1 36 (SUTURE) ×10 IMPLANT
SUT VIC AB 2-0 CT1 27 (SUTURE) ×8
SUT VIC AB 2-0 CT1 TAPERPNT 27 (SUTURE) ×4 IMPLANT
SYR 30ML LL (SYRINGE) ×6 IMPLANT
SYR TB 1ML LUER SLIP (SYRINGE) ×2 IMPLANT
TOWEL GREEN STERILE (TOWEL DISPOSABLE) ×4 IMPLANT
TOWEL GREEN STERILE FF (TOWEL DISPOSABLE) ×4 IMPLANT
TRAY CATH 16FR W/PLASTIC CATH (SET/KITS/TRAYS/PACK) IMPLANT
TRI CRUC RET FEM SZ7 KNEE (Orthopedic Implant) ×2 IMPLANT
WATER STERILE IRR 1000ML POUR (IV SOLUTION) IMPLANT
YANKAUER SUCT BULB TIP NO VENT (SUCTIONS) ×2 IMPLANT

## 2021-04-10 NOTE — Progress Notes (Signed)
Pt seen in room,alert/oriented in no apparent distress, surgical site with ace wrap CDI, Pt orientated to room/equipments. Welcome guide/menu provided with instructions. Pt verbalized understanding of instructions. Hospital valuables policy has been discussed with no complaints. Hospital bed in lowest position with 3 side rails up, call bell/room phone within reach and all wheels locked.

## 2021-04-10 NOTE — Op Note (Signed)
Kevin Patterson, Kevin Patterson MEDICAL RECORD NO: [id number] ACCOUNT NO: [id number] DATE OF BIRTH: [birth year] FACILITY: [facility] LOCATION: [facility] PHYSICIAN: Yetta Barre. Marlou Sa, MD  Operative Report   DATE OF PROCEDURE: 04/10/2021  PREOPERATIVE DIAGNOSIS:  Right knee arthritis.  POSTOPERATIVE DIAGNOSIS:  Right knee arthritis.  PROCEDURE:  Right total knee replacement using Stryker press-fit components, size 7 femur, 7 tibia, 13 mm polyethylene insert deep dish posterior cruciate retaining with 38 mm 3-peg press-fit patella.  SURGEON:  Yetta Barre. Marlou Sa, MD  ASSISTANT:  Annie Main.  INDICATIONS:  The patient is a 65 year old patient with end-stage right knee arthritis refractory to nonoperative management, who presents for operative management after explanation of risks and benefits.  DESCRIPTION OF PROCEDURE:  The patient was brought to the operating room where spinal anesthetic was induced.  Preoperative antibiotics administered.  Timeout was called.  Right leg was examined under anesthesia and found to have just shy of 20-degree  flexion contracture with varus alignment.  The patient was able to flex to about 105 degrees.  Right leg was prescrubbed with alcohol and Betadine and allowed to air dry.  Prepped with DuraPrep solution and draped in a sterile manner.  Ioban used to  cover the operative field.  A timeout was called.  The leg was elevated and exsanguinated with the Esmarch wrap.  Tourniquet was inflated.  Anterior approach to the knee was made.  The IrriSept solution utilized.  Median parapatellar arthrotomy was made  marked with a #1 Vicryl suture.  The IrriSept solution, placed into the joint.  Lateral patellofemoral ligament was released.  Medial soft tissue dissection was performed proportion to the patient's preoperative deformity.  Soft tissue removed from the  anterior distal femur.  Synovectomy performed because of irritated and inflamed synovium.  The patient had severe end-stage  tricompartmental arthritis.  Anterior horn of the lateral meniscus was released, ACL released.  With collaterals and posterior  neurovascular structures protected intramedullary alignment was used to initially make a cut of 2 mm off the least affected medial tibial plateau, sclerosis was present and cut later revised 2 more millimeters for 10 mm total.  Perpendicular to the  mechanical axis using intramedullary alignment the femur was then cut initially at 10 mm and then revised 2 more times to 14 mm total.  This allowed after making the anterior, posterior and chamfer cuts.  An 11 spacer to sit with full extension.  Femur  was sized to a size 7 and the anterior, posterior and chamfer cuts were made.  Trial components placed with the tibial tray in appropriate rotational alignment.  The patient had good stability with both an 11 and 13 mm spacer, but the 13 mm spacer gave  better stability in full extension, although it did leave him with about a 3-4 degree flexion contracture and I tried the 11 spacer, but the medial and lateral stability was less than with the 13 spacer.  Tibia was keel punched for final preparations  made on the femur, patella was cut down from 20 to 18 mm and 3 peg trial patella was placed.  With trial components in position the patient lacked about 3-4 degrees of full extension, had full flexion with no liftoff and good stability to varus and  valgus stress at both 0, 30 and 90 degrees.  Patella tracked very well.  Tibia was keel punched at that time.  Trial components were removed.  Thorough irrigation performed with 3 liters of irrigating solution.  Capsule anesthetized using a combination  of Marcaine, Exparel and saline.  Tranexamic acid sponge allowed along with the IrriSept solution set in the knee for 3 minutes.  This was removed.  The IrriSept solution, placed in the intramedullary canal of the tibia, which was then suctioned and  vancomycin powder placed.  The size 7 tibia was  then tapped into position with excellent press fit obtained.  Bone quality was good.  Femur was then placed.  The 13 mm poly was placed and then the patella was placed with a good press fit obtained.  Next,  the tourniquet was released, bleeding points encountered were controlled with electrocautery.  Same stability parameters were maintained.  The arthrotomy was then closed over bolster using #1 Vicryl suture.  Prior to final closure, the joint was  irrigated with IrriSept solution.  Vancomycin powder placed and the knee arthrotomy was closed.  Injection into the arthrotomy was then performed with a Marcaine, morphine, clonidine.  Next irrigating solution followed by IrriSept solution, Vancomycin  powder was placed and then the skin was closed using 0 Vicryl suture, 2-0 Vicryl suture, and 3-0 Monocryl with Steri-Strips.  Aquacel dressing applied, followed by Ace wrap, Iceman and knee immobilizer.  Luke's assistance was required at all times for  opening, closing, mobilization of tissue and retraction.  His assistance was a medical necessity.  The patient tolerated the procedure well and was transferred to recovery room in stable condition.   PUS D: 04/10/2021 1:58:23 pm T: 04/10/2021 3:08:00 pm  JOB: [id number]/ [id number]

## 2021-04-10 NOTE — Progress Notes (Signed)
Patient received spinal block before knee arthroplasty. Patient is now wiggling toes, bending knees, and has sensation back in both lower extremities. Patient is still incontinent of urine. Oren Bracket, MD made aware. No new orders. Will continue to monitor.

## 2021-04-10 NOTE — Brief Op Note (Signed)
° °  04/10/2021  1:50 PM  PATIENT:  Kevin Patterson  65 y.o. male  PRE-OPERATIVE DIAGNOSIS:  right knee osteoarthritis  POST-OPERATIVE DIAGNOSIS:  right knee osteoarthritis  PROCEDURE:  Procedure(s): RIGHT TOTAL KNEE ARTHROPLASTY  SURGEON:  Surgeon(s): Meredith Pel, MD  ASSISTANT: magnant pa  ANESTHESIA:   spinal  EBL: 75 ml    Total I/O In: 1000 [I.V.:1000] Out: 150 [Urine:100; Blood:50]  BLOOD ADMINISTERED: none  DRAINS: none   LOCAL MEDICATIONS USED:  marcaine mso4 clonidine exparel vanco  SPECIMEN:  No Specimen  COUNTS:  YES  TOURNIQUET:   Total Tourniquet Time Documented: Thigh (Right) - 98 minutes Total: Thigh (Right) - 98 minutes   DICTATION: .Other Dictation: Dictation Number [id number]  PLAN OF CARE: Admit for overnight observation  PATIENT DISPOSITION:  PACU - hemodynamically stable

## 2021-04-10 NOTE — Progress Notes (Signed)
PT Cancellation Note  Patient Details Name: Kevin Patterson MRN: [id number] DOB: [birth year]   Cancelled Treatment:    Reason Eval/Treat Not Completed: Other (comment). Pt still in PACU.    Shary Decamp [facility] 04/10/2021, 5:45 PM [place] Pager [phone] Office [phone]

## 2021-04-10 NOTE — Progress Notes (Signed)
Orthopedic Tech Progress Note Patient Details:  Kevin Patterson 26-Jun-1955 [id number]  CPM Right Knee CPM Right Knee: On Right Knee Flexion (Degrees): 10 Right Knee Extension (Degrees): 40  Post Interventions Patient Tolerated: Well  Laine Fonner A Darletta Noblett 04/10/2021, 3:46 PM

## 2021-04-10 NOTE — H&P (Signed)
TOTAL KNEE ADMISSION H&P  Patient is being admitted for right total knee arthroplasty.  Subjective:  Chief Complaint:right knee pain.  HPI: Kevin Patterson, 66 y.o. male, has a history of pain and functional disability in the right knee due to arthritis and has failed non-surgical conservative treatments for greater than 12 weeks to includeNSAID's and/or analgesics, corticosteriod injections, use of assistive devices, and activity modification.  Onset of symptoms was gradual, starting 8 years ago with gradually worsening course since that time. The patient noted no prior surgery on the right knee(s).  Patient currently rates pain in the right knee(s) at 8 out of 10 with activity. Patient has night pain, worsening of pain with activity and weight bearing, pain that interferes with activities of daily living, pain with passive range of motion, crepitus, and joint swelling.  Patient has evidence of subchondral sclerosis and joint space narrowing by imaging studies. This patient has had  a good result with his left total knee replacement performed elsewhere. . There is no active infection.  Also notable in this patient's history is that he does have a small osteochondral defect in his right hip.  This was not believed to be the main symptom/pain generator.  He does have end-stage severe right knee arthritis.  He has also been advised by his spine surgeon to undergo knee replacement prior to any type of back surgery.  Notably the patient's white blood cell count was elevated last week.  We repeated it and it remained in the 16-17,000 range.  Discussed over the weekend with hematologist who recommended flow cytometry after the procedure.  Did not see this as a contraindication to knee replacement after reviewing the laboratory values.  White count was elevated but the neutrophil count was normal and it was the lymphocytes and monocytes which were elevated..  Patient also reports some burning with urination  over  3 days last week with no fevers.  Patient states that the symptoms last week resolved without any intervention.  Urinalysis pending  Patient Active Problem List   Diagnosis Date Noted   DJD (degenerative joint disease) of knee 03/13/2015   Past Medical History:  Diagnosis Date   Arthritis    Cyst of kidney, acquired    history of , monitored   Hypertension     Past Surgical History:  Procedure Laterality Date   APPENDECTOMY     BACK SURGERY     JOINT REPLACEMENT     left knee   KNEE ARTHROTOMY  12/28/1968   open cleanout   TONSILLECTOMY     TOTAL KNEE ARTHROPLASTY Left 03/13/2015   Procedure: LEFT TOTAL KNEE ARTHROPLASTY;  Surgeon: Elsie Saas, MD;  Location: [place];  Service: Orthopedics;  Laterality: Left;    Current Facility-Administered Medications  Medication Dose Route Frequency Provider Last Rate Last Admin   ceFAZolin (ANCEF) IVPB 2g/100 mL premix  2 g Intravenous On Call to OR Magnant, Gerrianne Scale, PA-C       lactated ringers infusion   Intravenous Continuous Annye Asa, MD       povidone-iodine (BETADINE) 7.5 % scrub   Topical Once Magnant, Charles L, PA-C       povidone-iodine 10 % swab 2 application  2 application Topical Once Magnant, Charles L, PA-C       povidone-iodine 10 % swab 2 application  2 application Topical Once Magnant, Charles L, PA-C       tranexamic acid (CYKLOKAPRON) 2,000 mg in sodium chloride 0.9 % 50 mL Topical Application  2,000 mg Topical To OR Marlou Sa Tonna Corner, MD       tranexamic acid (CYKLOKAPRON) IVPB 1,000 mg  1,000 mg Intravenous To OR Magnant, Charles L, PA-C       Facility-Administered Medications Ordered in Other Encounters  Medication Dose Route Frequency Provider Last Rate Last Admin   dexamethasone (DECADRON) injection   Infiltration Anesthesia Intra-op Duane Boston, MD   5 mg at 04/10/21 0948   ropivacaine (PF) 7.5 mg/mL (0.75%) (NAROPIN) injection   Peri-NEURAL Anesthesia Intra-op Duane Boston, MD   20 mL at  04/10/21 0948   No Known Allergies  Social History   Tobacco Use   Smoking status: Former    Types: Cigarettes    Quit date: 04/29/1973    Years since quitting: 47.9   Smokeless tobacco: Not on file  Substance Use Topics   Alcohol use: Yes    Alcohol/week: 3.0 standard drinks    Types: 3 Glasses of wine per week    History reviewed. No pertinent family history.   Review of Systems  Musculoskeletal:  Positive for arthralgias.  All other systems reviewed and are negative.  Objective:  Physical Exam Vitals reviewed.  HENT:     Head: Normocephalic.     Nose: Nose normal.     Mouth/Throat:     Mouth: Mucous membranes are moist.  Eyes:     Pupils: Pupils are equal, round, and reactive to light.  Cardiovascular:     Rate and Rhythm: Normal rate.     Pulses: Normal pulses.  Pulmonary:     Effort: Pulmonary effort is normal.  Abdominal:     General: Abdomen is flat.  Musculoskeletal:     Cervical back: Normal range of motion.  Skin:    General: Skin is warm.     Capillary Refill: Capillary refill takes less than 2 seconds.  Neurological:     General: No focal deficit present.     Mental Status: He is alert.  Psychiatric:        Mood and Affect: Mood normal.  Ortho exam demonstrates full active and passive range of motion of the ankle and hip.  No groin pain on the right with that range of motion.  Knee range of motion is diminished to approximately 5 to 100 degrees.  Collateral crucial ligaments are stable.  Mild varus alignment present.  Pedal pulses palpable.  Ankle dorsiflexion intact.  No masses lymphadenopathy or skin changes noted in that right knee region.  No effusion today  Vital signs in last 24 hours: Temp:  [98.3 F (36.8 C)] 98.3 F (36.8 C) (12/13 0824) Pulse Rate:  [68] 68 (12/13 0824) Resp:  [17] 17 (12/13 0824) BP: (159)/(97) 159/97 (12/13 0824) SpO2:  [98 %] 98 % (12/13 0824) Weight:  [104.3 kg] 104.3 kg (12/13 0824)  Labs:   Estimated body mass  index is 29.53 kg/m as calculated from the following:   Height as of this encounter: 6\' 2"  (1.88 m).   Weight as of this encounter: 104.3 kg.   Imaging Review Plain radiographs demonstrate severe degenerative joint disease of the a well knee(s). The overall alignment ismild varus. The bone quality appears to be good for age and reported activity level.      Assessment/Plan:  End stage arthritis, right knee   The patient history, physical examination, clinical judgment of the provider and imaging studies are consistent with end stage degenerative joint disease of the right knee(s) and total knee arthroplasty is deemed medically necessary.  The treatment options including medical management, injection therapy arthroscopy and arthroplasty were discussed at length. The risks and benefits of total knee arthroplasty were presented and reviewed. The risks due to aseptic loosening, infection, stiffness, patella tracking problems, thromboembolic complications and other imponderables were discussed. The patient acknowledged the explanation, agreed to proceed with the plan and consent was signed. Patient is being admitted for inpatient treatment for surgery, pain control, PT, OT, prophylactic antibiotics, VTE prophylaxis, progressive ambulation and ADL's and discharge planning. The patient is planning to be discharged home with home health services     Patient's anticipated LOS is less than 2 midnights, meeting these requirements: - Younger than 17 - Lives within 1 hour of care - Has a competent adult at home to recover with post-op recover - NO history of  - Chronic pain requiring opiods  - Diabetes  - Coronary Artery Disease  - Heart failure  - Heart attack  - Stroke  - DVT/VTE  - Cardiac arrhythmia  - Respiratory Failure/COPD  - Renal failure  - Anemia  - Advanced Liver disease

## 2021-04-10 NOTE — Anesthesia Procedure Notes (Signed)
Procedure Name: MAC Date/Time: 04/10/2021 10:56 AM Performed by: Erick Colace, CRNA Pre-anesthesia Checklist: Patient identified, Emergency Drugs available, Suction available and Patient being monitored Patient Re-evaluated:Patient Re-evaluated prior to induction Oxygen Delivery Method: Simple face mask Preoxygenation: Pre-oxygenation with 100% oxygen Ventilation: Oral airway inserted - appropriate to patient size

## 2021-04-10 NOTE — Anesthesia Procedure Notes (Signed)
Spinal  Patient location during procedure: OR Start time: 04/10/2021 10:37 AM End time: 04/10/2021 10:48 AM Reason for block: surgical anesthesia Staffing Performed: anesthesiologist  Anesthesiologist: Duane Boston, MD Preanesthetic Checklist Completed: patient identified, IV checked, risks and benefits discussed, surgical consent, monitors and equipment checked, pre-op evaluation and timeout performed Spinal Block Patient position: sitting Prep: DuraPrep Patient monitoring: cardiac monitor, continuous pulse ox and blood pressure Approach: midline Location: L2-3 Injection technique: single-shot Needle Needle type: Pencan  Needle gauge: 24 G Needle length: 9 cm Assessment Events: CSF return Additional Notes Functioning IV was confirmed and monitors were applied. Sterile prep and drape, including hand hygiene and sterile gloves were used. The patient was positioned and the spine was prepped. The skin was anesthetized with lidocaine.  Free flow of clear CSF was obtained prior to injecting local anesthetic into the CSF.  The spinal needle aspirated freely following injection.  The needle was carefully withdrawn.  The patient tolerated the procedure well.

## 2021-04-10 NOTE — Progress Notes (Signed)
Orthopedic Tech Progress Note Patient Details:  Kevin Patterson 09-21-1955 [id number]  Patient ID: Kevin Patterson, male   DOB: 1955/09/28, 65 y.o.   MRN: [id number] Bone foam dropped off in patients room.  Vernona Rieger 04/10/2021, 8:15 PM

## 2021-04-10 NOTE — Anesthesia Postprocedure Evaluation (Signed)
Anesthesia Post Note  Patient: Gayle Collard  Procedure(s) Performed: RIGHT TOTAL KNEE ARTHROPLASTY (Right: Knee)     Patient location during evaluation: PACU Anesthesia Type: Spinal Level of consciousness: awake and alert Pain management: pain level controlled Vital Signs Assessment: post-procedure vital signs reviewed and stable Respiratory status: spontaneous breathing and respiratory function stable Cardiovascular status: blood pressure returned to baseline and stable Postop Assessment: spinal receding Anesthetic complications: no   No notable events documented.  Last Vitals:  Vitals:   04/10/21 1655 04/10/21 1725  BP: 134/69 124/77  Pulse: (!) 59 (!) 55  Resp: 13 14  Temp:    SpO2: 98% 98%    Last Pain:  Vitals:   04/10/21 1655  TempSrc:   PainSc: 0-No pain                 Jessy Calixte DANIEL

## 2021-04-10 NOTE — Anesthesia Procedure Notes (Signed)
Anesthesia Regional Block: Adductor canal block   Pre-Anesthetic Checklist: , timeout performed,  Correct Patient, Correct Site, Correct Laterality,  Correct Procedure, Correct Position, site marked,  Risks and benefits discussed,  Surgical consent,  Pre-op evaluation,  At surgeon's request and post-op pain management  Laterality: Right  Prep: chloraprep       Needles:  Injection technique: Single-shot  Needle Type: Stimulator Needle - 80     Needle Length: 10cm  Needle Gauge: 21     Additional Needles:   Narrative:  Start time: 04/10/2021 9:39 AM End time: 04/10/2021 9:49 AM Injection made incrementally with aspirations every 5 mL.  Performed by: Personally  Anesthesiologist: Duane Boston, MD

## 2021-04-10 NOTE — Transfer of Care (Signed)
Immediate Anesthesia Transfer of Care Note  Patient: Kevin Patterson  Procedure(s) Performed: RIGHT TOTAL KNEE ARTHROPLASTY (Right: Knee)  Patient Location: PACU  Anesthesia Type:MAC, Regional and Spinal  Level of Consciousness: drowsy  Airway & Oxygen Therapy: Patient Spontanous Breathing and Patient connected to face mask oxygen  Post-op Assessment: Report given to RN and Post -op Vital signs reviewed and stable  Post vital signs: Reviewed and stable  Last Vitals:  Vitals Value Taken Time  BP 115/87 04/10/21 1355  Temp    Pulse 65 04/10/21 1356  Resp 15 04/10/21 1356  SpO2 100 % 04/10/21 1356  Vitals shown include unvalidated device data.  Last Pain:  Vitals:   04/10/21 0901  TempSrc:   PainSc: 0-No pain         Complications: No notable events documented.

## 2021-04-11 ENCOUNTER — Encounter (HOSPITAL_COMMUNITY): Payer: Self-pay | Admitting: Orthopedic Surgery

## 2021-04-11 DIAGNOSIS — M1711 Unilateral primary osteoarthritis, right knee: Secondary | ICD-10-CM | POA: Diagnosis not present

## 2021-04-11 LAB — BASIC METABOLIC PANEL
Anion gap: 5 (ref 5–15)
BUN: 11 mg/dL (ref 8–23)
CO2: 27 mmol/L (ref 22–32)
Calcium: 8.8 mg/dL — ABNORMAL LOW (ref 8.9–10.3)
Chloride: 101 mmol/L (ref 98–111)
Creatinine, Ser: 1.14 mg/dL (ref 0.61–1.24)
GFR, Estimated: >60 mL/min (ref >60)
Glucose, Bld: 123 mg/dL — ABNORMAL HIGH (ref 70–99)
Potassium: 4.3 mmol/L (ref 3.5–5.1)
Sodium: 133 mmol/L — ABNORMAL LOW (ref 135–145)

## 2021-04-11 LAB — CBC
HCT: 35.2 % — ABNORMAL LOW (ref 39.0–52.0)
Hemoglobin: 11 g/dL — ABNORMAL LOW (ref 13.0–17.0)
MCH: 28.6 pg (ref 26.0–34.0)
MCHC: 31.3 g/dL (ref 30.0–36.0)
MCV: 91.4 fL (ref 80.0–100.0)
Platelets: 182 10*3/uL (ref 150–400)
RBC: 3.85 MIL/uL — ABNORMAL LOW (ref 4.22–5.81)
RDW: 12.8 % (ref 11.5–15.5)
WBC: 22.4 10*3/uL — ABNORMAL HIGH (ref 4.0–10.5)
nRBC: 0.2 % (ref 0.0–0.2)

## 2021-04-11 LAB — URINE CULTURE: Culture: NO GROWTH

## 2021-04-11 MED ORDER — ASPIRIN 81 MG PO CHEW: 81.0000 mg | CHEWABLE_TABLET | Freq: Two times a day (BID) | ORAL | 0 refills | Status: DC

## 2021-04-11 MED ORDER — METHOCARBAMOL 500 MG PO TABS: 500.0000 mg | ORAL_TABLET | Freq: Three times a day (TID) | ORAL | 0 refills | Status: DC | PRN

## 2021-04-11 MED ORDER — OXYCODONE HCL 5 MG PO TABS: 5.0000 mg | ORAL_TABLET | ORAL | 0 refills | Status: DC | PRN

## 2021-04-11 NOTE — Progress Notes (Signed)
°  Subjective: Kevin Patterson is a 65 y.o. male s/p right TKA.  They are POD 1.  Pt's pain is controlled. Pt has not ambulated yet. Denies any chest pain, shortness of breath, abdominal pain, calf pain, fevers, chills, night sweats.  Feels pain is well controlled.  Objective: Vital signs in last 24 hours: Temp:  [97 F (36.1 C)-98.5 F (36.9 C)] 98.5 F (36.9 C) (12/14 0733) Pulse Rate:  [44-76] 76 (12/14 0733) Resp:  [12-18] 18 (12/14 0733) BP: (108-156)/(67-87) 123/68 (12/14 0733) SpO2:  [95 %-100 %] 100 % (12/14 0733)  Intake/Output from previous day: 12/13 0701 - 12/14 0700 In: 1360 [P.O.:360; I.V.:1000] Out: 150 [Urine:100; Blood:50] Intake/Output this shift: No intake/output data recorded.  Exam:  No gross blood or drainage overlying the dressing 2+ DP pulse Sensation intact distally in the right foot Able to dorsiflex and plantarflex the right foot No calf tenderness.  Negative Homans' sign.  Able to perform straight leg raise without extensor lag.   Labs: Recent Labs    04/10/21 0915 04/11/21 0209  HGB 12.9* 11.0*   Recent Labs    04/10/21 0915 04/11/21 0209  WBC 16.7* 22.4*  RBC 4.44 3.85*  HCT 40.8 35.2*  PLT 180 182   Recent Labs    04/10/21 0915 04/11/21 0209  NA 135 133*  K 4.4 4.3  CL 106 101  CO2 23 27  BUN 13 11  CREATININE 1.00 1.14  GLUCOSE 102* 123*  CALCIUM 9.2 8.8*   No results for input(s): LABPT, INR in the last 72 hours.  Assessment/Plan: Pt is POD 1 s/p right TKA.    -Plan to discharge to home today or tomorrow pending patient's pain and PT eval  -WBAT with a walker  -Patient's pain is well controlled.  Able to form straight leg raise with good quad control.  Plan for him to work with physical therapy today and anticipate discharge home later today barring any difficulties with mobilization or symptomatic lightheadedness/dizziness during ambulation.     Marsia Cino L Saagar Tortorella 04/11/2021, 8:31 AM

## 2021-04-11 NOTE — Progress Notes (Signed)
Discharge summary packet provided to pt with instrucitions. Pt verbalized understanding of instructions. Wound care management has been discussed. No complaints. Pt d/c to home as ordered. Pt remains alert/oriented in no apparent distress. Spouse is responsible for pt's transportation.

## 2021-04-11 NOTE — Progress Notes (Signed)
Physical Therapy Treatment Patient Details Name: Kevin Patterson MRN: [id number] DOB: February 06, 1956 Today's Date: 04/11/2021   History of Present Illness Admitted for R TKA, WBAT;  has a past medical history of Arthritis, Cyst of kidney, acquired, and Hypertension.    PT Comments    Continuing work on functional mobility and activity tolerance; Session focused on prgressive amb and stair training in prep for going home, which pt and wife performed well; Questions solicited and answered; OK for dc home from PT standpoint; Notified Ortho PA of excellent progress   Recommendations for follow up therapy are one component of a multi-disciplinary discharge planning process, led by the attending physician.  Recommendations may be updated based on patient status, additional functional criteria and insurance authorization.  Follow Up Recommendations  Follow physician's recommendations for discharge plan and follow up therapies     Assistance Recommended at Discharge PRN  Equipment Recommendations  None recommended by PT    Recommendations for Other Services       Precautions / Restrictions Precautions Precautions: Knee Precaution Booklet Issued: Yes (comment) Precaution Comments: Pt educated to not allow any pillow or bolster under knee for healing with optimal range of motion.  Restrictions Weight Bearing Restrictions: No     Mobility  Bed Mobility                    Transfers Overall transfer level: Needs assistance Equipment used: Rolling walker (2 wheels) Transfers: Sit to/from Stand Sit to Stand: Min guard           General transfer comment: Cues for hand placement and to self-monitor for activity tolerance; slow rise    Ambulation/Gait Ambulation/Gait assistance: Min guard Gait Distance (Feet): 150 Feet Assistive device: Rolling walker (2 wheels) Gait Pattern/deviations: Step-through pattern Gait velocity: slow     General Gait Details: Cues to stand  tall on RLE in stance to encourage quad and gliteal activation for more R knee stabiltiy in stance   Stairs Stairs: Yes Stairs assistance: Min assist Stair Management: No rails;With walker;Forwards;Backwards;Step to pattern Number of Stairs: 2 (performed x3) General stair comments: Cues for technique; Gave options for RW use and forwards vs backwards, and pt practiced each; Ultimately, he and wide seemed to like backwards best   Wheelchair Mobility    Modified Rankin (Stroke Patients Only)       Balance                                            Cognition Arousal/Alertness: Awake/alert Behavior During Therapy: WFL for tasks assessed/performed Overall Cognitive Status: Within Functional Limits for tasks assessed                                          Exercises      General Comments General comments (skin integrity, edema, etc.): Discussed car transfer and management      Pertinent Vitals/Pain Pain Assessment: 0-10 Pain Score: 6  Pain Location: R knee Pain Descriptors / Indicators: Aching (stiff form a long time in bone foam) Pain Intervention(s): Monitored during session    Home Living                          Prior Function  PT Goals (current goals can now be found in the care plan section) Acute Rehab PT Goals Patient Stated Goal: be able to walk and drive well for return to work PT Goal Formulation: With patient Time For Goal Achievement: 04/18/21 Potential to Achieve Goals: Good Progress towards PT goals: Progressing toward goals    Frequency    7X/week      PT Plan Current plan remains appropriate    Co-evaluation              AM-PAC PT "6 Clicks" Mobility   Outcome Measure  Help needed turning from your back to your side while in a flat bed without using bedrails?: None Help needed moving from lying on your back to sitting on the side of a flat bed without using bedrails?: A  Little Help needed moving to and from a bed to a chair (including a wheelchair)?: A Little Help needed standing up from a chair using your arms (e.g., wheelchair or bedside chair)?: A Little Help needed to walk in hospital room?: A Little Help needed climbing 3-5 steps with a railing? : A Little 6 Click Score: 19    End of Session Equipment Utilized During Treatment: Gait belt Activity Tolerance: Patient tolerated treatment well Patient left: in chair;with call bell/phone within reach;with family/visitor present Nurse Communication: Mobility status PT Visit Diagnosis: Other abnormalities of gait and mobility (R26.89);Pain Pain - Right/Left: Right Pain - part of body: Knee     Time: 3748-2707 PT Time Calculation (min) (ACUTE ONLY): 44 min  Charges:  $Gait Training: 23-37 mins $Therapeutic Activity: 8-22 mins                     Roney Marion, PT  Acute Rehabilitation Services Pager [phone] Office [place] 04/11/2021, 3:42 PM

## 2021-04-11 NOTE — Evaluation (Signed)
Physical Therapy Evaluation Patient Details Name: Kevin Patterson MRN: [id number] DOB: 21-Jun-1955 Today's Date: 04/11/2021  History of Present Illness  Admitted for R TKA, WBAT;  has a past medical history of Arthritis, Cyst of kidney, acquired, and Hypertension.  Clinical Impression  Pt is s/p TKA resulting in the deficits listed below (see PT Problem List). 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   Will return in the early afternoon for stair training        Recommendations for follow up therapy are one component of a multi-disciplinary discharge planning process, led by the attending physician.  Recommendations may be updated based on patient status, additional functional criteria and insurance authorization.  Follow Up Recommendations Follow physician's recommendations for discharge plan and follow up therapies    Assistance Recommended at Discharge PRN  Functional Status Assessment Patient has had a recent decline in their functional status and demonstrates the ability to make significant improvements in function in a reasonable and predictable amount of time.  Equipment Recommendations  None recommended by PT    Recommendations for Other Services       Precautions / Restrictions Precautions Precautions: Knee Precaution Booklet Issued: Yes (comment) Restrictions Weight Bearing Restrictions: No      Mobility  Bed Mobility Overal bed mobility: Modified Independent             General bed mobility comments: Employed log roll technique, using LLE to support RLE coming off of the bed; incr time, but good  form and no physical assist needed    Transfers Overall transfer level: Needs assistance Equipment used: Rolling walker (2 wheels) Transfers: Sit to/from Stand Sit to Stand: Min guard (with and without physical contact)           General transfer comment: Cues for hand placement and to self-monitor for activity tolerance; slow rise    Ambulation/Gait Ambulation/Gait assistance: Min guard (with and twithout physical contact) Gait Distance (Feet): 180 Feet Assistive device: Rolling walker (2 wheels) Gait Pattern/deviations: Step-through pattern (emerging) Gait velocity: slwo     General Gait Details: Cues to stand tall on RLE in stance to encourage quad and gliteal activation for more R knee stabiltiy in stance  Stairs            Wheelchair Mobility    Modified Rankin (Stroke Patients Only)       Balance                                             Pertinent Vitals/Pain Pain Assessment: 0-10 Pain Score: 4  Pain Location: R knee Pain Descriptors / Indicators: Aching Pain Intervention(s): Monitored during session;Premedicated before session    Home Living Family/patient expects to be discharged to:: Private residence Living Arrangements: Spouse/significant other;Children Available Help at Discharge: Family;Available 24 hours/day Type of Home: House Home Access: Stairs to enter Entrance Stairs-Rails: None Entrance Stairs-Number of Steps: 2   Home Layout: Multi-level;Able to live on main level with bedroom/bathroom Home Equipment: Rolling Walker (2 wheels);BSC/3in1 Additional Comments: Well-equipped  from previous surgeries    Prior Function Prior Level of Function : Independent/Modified Independent;Working/employed;Other (comment) Associate Professor)             Mobility Comments: Lumbar surgery within the past few years; naturally does log roll for bed mobility       Hand Dominance        Extremity/Trunk Assessment   Upper  Extremity Assessment Upper Extremity Assessment: Overall WFL for tasks assessed    Lower Extremity Assessment Lower Extremity Assessment: RLE deficits/detail RLE Deficits / Details: Grossly decr AROM and strength, as well as predictable edema postop; Good quad activation; range approx -1 to 70 degrees       Communication   Communication: No difficulties  Cognition Arousal/Alertness: Awake/alert Behavior During Therapy: WFL for tasks assessed/performed Overall Cognitive Status: Within Functional Limits for tasks assessed                                          General Comments General comments (skin integrity, edema, etc.): We discussed goals of Actue Care PT and what to expect for PT as he progresses; Pt tells me HHPT has been arranged, and he has needed equipment    Exercises Total Joint Exercises Quad Sets: AROM;Right;10 reps Heel Slides: AROM;Right;5 reps Straight Leg Raises: AROM;Right;5 reps (quad lag present, but minimal)   Assessment/Plan    PT Assessment Patient needs continued PT services  PT Problem List Decreased strength;Decreased range of motion;Decreased activity tolerance;Decreased knowledge of use of DME;Pain;Decreased knowledge of precautions       PT Treatment Interventions DME instruction;Gait training;Stair training;Functional mobility training;Therapeutic activities;Therapeutic exercise;Patient/family education    PT Goals (Current goals can be found in the Care Plan section)  Acute Rehab PT Goals Patient Stated Goal: be able to walk and drive well for return to work PT Goal Formulation: With patient Time For Goal Achievement: 04/18/21 Potential to Achieve Goals: Good    Frequency 7X/week   Barriers to discharge        Co-evaluation               AM-PAC PT "6 Clicks" Mobility  Outcome Measure Help needed turning from your back to your side while in a flat bed without using bedrails?: None Help needed moving from lying  on your back to sitting on the side of a flat bed without using bedrails?: A Little Help needed moving to and from a bed to a chair (including a wheelchair)?: A Little Help needed standing up from a chair using your arms (e.g., wheelchair or bedside chair)?: A Little Help needed to walk in hospital room?: A Little Help needed climbing 3-5 steps with a railing? : A Little 6 Click Score: 19    End of Session Equipment Utilized During Treatment: Gait belt Activity Tolerance: Patient tolerated treatment well Patient left: in chair;with call bell/phone within reach;with family/visitor present Nurse Communication: Mobility status (and will likely dc home today) PT Visit Diagnosis: Other abnormalities of gait and mobility (R26.89);Pain Pain - Right/Left: Right Pain - part of body: Knee    Time: 6767-2094 PT Time Calculation (min) (ACUTE ONLY): 42 min   Charges:   PT Evaluation $PT Eval Low Complexity: 1 Low PT Treatments $Gait Training: 8-22 mins $Therapeutic Activity: 8-22 mins        Roney Marion, PT  Acute Rehabilitation Services Pager [phone] Office [phone]   [place]  04/11/2021, 11:03 AM

## 2021-04-13 LAB — FLOW CYTOMETRY

## 2021-04-17 ENCOUNTER — Telehealth: Payer: Self-pay | Admitting: Surgical

## 2021-04-17 NOTE — Telephone Encounter (Signed)
Pt called requesting a call bak from [facility] or Dr. Marlou Sa about his pains. Pt states he hasnt spelt in 2 days and need medical advice. Please call pt at [phone].

## 2021-04-17 NOTE — Telephone Encounter (Signed)
Called and left VM message.

## 2021-04-19 ENCOUNTER — Ambulatory Visit (INDEPENDENT_AMBULATORY_CARE_PROVIDER_SITE_OTHER): Payer: 59

## 2021-04-19 ENCOUNTER — Ambulatory Visit (INDEPENDENT_AMBULATORY_CARE_PROVIDER_SITE_OTHER): Payer: 59 | Admitting: Orthopedic Surgery

## 2021-04-19 ENCOUNTER — Other Ambulatory Visit: Payer: Self-pay

## 2021-04-19 DIAGNOSIS — Z96651 Presence of right artificial knee joint: Secondary | ICD-10-CM | POA: Diagnosis not present

## 2021-04-19 MED ORDER — OXYCODONE HCL 5 MG PO TABS: ORAL_TABLET | ORAL | 0 refills | Status: DC

## 2021-04-23 ENCOUNTER — Encounter: Payer: Self-pay | Admitting: Orthopedic Surgery

## 2021-04-23 DIAGNOSIS — M1711 Unilateral primary osteoarthritis, right knee: Secondary | ICD-10-CM

## 2021-04-23 NOTE — Progress Notes (Signed)
Office Visit Note   Patient: Kevin Patterson           Date of Birth: 02/06/1956           MRN: [id number] Visit Date: 04/19/2021 Requested by: Mina Marble, PA-C [place],  [place] [id number] PCP: Drosinis, Pamalee Leyden, PA-C  Subjective: Chief Complaint  Patient presents with   Right Knee - Routine Post Op    04/10/21 Right TKA    HPI: Kevin Patterson is a 65 year old patient who is now 2 weeks out right total knee replacement.  He is up to 94 degrees on CPM.  On aspirin for DVT prophylaxis.  On exam incision is intact.  Range of motion today is 0-75 cold.  Plan is refill oxycodone and start physical therapy in the weeks of January 2 out on [address] closer to his home.  Home health PT until then.  We will also refer him to Dr. Earlie Server for increased white blood cell count with possible hematologic abnormality present.  Follow-up in 4 weeks for clinical recheck.              ROS: See above  Assessment & Plan: Visit Diagnoses:  1. History of total right knee replacement     Plan: See above  Follow-Up Instructions: Return in about 4 weeks (around 05/17/2021).   Orders:  Orders Placed This Encounter  Procedures   XR Knee 1-2 Views Right   Ambulatory referral to Hematology / Oncology   Ambulatory referral to Physical Therapy   Meds ordered this encounter  Medications   oxyCODONE (OXY IR/ROXICODONE) 5 MG immediate release tablet    Sig: 1 po q 4 hrs prn pain    Dispense:  45 tablet    Refill:  0      Procedures: No procedures performed   Clinical Data: No additional findings.  Objective: Vital Signs: There were no vitals taken for this visit.  Physical Exam: See above  Ortho Exam: See above  Specialty Comments:  No specialty comments available.  Imaging: No results found.   PMFS History: Patient Active Problem List   Diagnosis Date Noted   S/P total knee replacement, right 04/10/2021   DJD (degenerative joint disease) of knee 03/13/2015    Past Medical History:  Diagnosis Date   Arthritis    Cyst of kidney, acquired    history of , monitored   Hypertension     History reviewed. No pertinent family history.  Past Surgical History:  Procedure Laterality Date   APPENDECTOMY     BACK SURGERY     JOINT REPLACEMENT     left knee   KNEE ARTHROTOMY  12/28/1968   open cleanout   TONSILLECTOMY     TOTAL KNEE ARTHROPLASTY Left 03/13/2015   Procedure: LEFT TOTAL KNEE ARTHROPLASTY;  Surgeon: Elsie Saas, MD;  Location: [facility];  Service: Orthopedics;  Laterality: Left;   TOTAL KNEE ARTHROPLASTY Right 04/10/2021   Procedure: RIGHT TOTAL KNEE ARTHROPLASTY;  Surgeon: Meredith Pel, MD;  Location: [facility];  Service: Orthopedics;  Laterality: Right;   Social History   Occupational History   Not on file  Tobacco Use   Smoking status: Former    Types: Cigarettes    Quit date: 04/29/1973    Years since quitting: 48.0   Smokeless tobacco: Not on file  Substance and Sexual Activity   Alcohol use: Yes    Alcohol/week: 3.0 standard drinks    Types: 3 Glasses of wine per  week   Drug use: Not on file   Sexual activity: Not on file

## 2021-04-25 ENCOUNTER — Telehealth: Payer: Self-pay | Admitting: Hematology

## 2021-04-25 NOTE — Telephone Encounter (Signed)
Scheduled appt per 12/22 referral. Pt is aware of appt date and time. Pt requested a letter be sent with appt information. Will mail letter tomorrow to pt.

## 2021-05-01 ENCOUNTER — Ambulatory Visit: Payer: 59 | Admitting: Rehabilitative and Restorative Service Providers"

## 2021-05-01 ENCOUNTER — Other Ambulatory Visit: Payer: Self-pay

## 2021-05-01 DIAGNOSIS — R2689 Other abnormalities of gait and mobility: Secondary | ICD-10-CM | POA: Insufficient documentation

## 2021-05-01 DIAGNOSIS — M1990 Unspecified osteoarthritis, unspecified site: Secondary | ICD-10-CM | POA: Diagnosis not present

## 2021-05-01 DIAGNOSIS — Z87891 Personal history of nicotine dependence: Secondary | ICD-10-CM | POA: Diagnosis not present

## 2021-05-01 DIAGNOSIS — Z7289 Other problems related to lifestyle: Secondary | ICD-10-CM | POA: Diagnosis not present

## 2021-05-01 DIAGNOSIS — R600 Localized edema: Secondary | ICD-10-CM | POA: Insufficient documentation

## 2021-05-01 DIAGNOSIS — Z96651 Presence of right artificial knee joint: Secondary | ICD-10-CM | POA: Insufficient documentation

## 2021-05-01 DIAGNOSIS — M25561 Pain in right knee: Secondary | ICD-10-CM | POA: Insufficient documentation

## 2021-05-01 DIAGNOSIS — M6281 Muscle weakness (generalized): Secondary | ICD-10-CM | POA: Insufficient documentation

## 2021-05-01 DIAGNOSIS — I10 Essential (primary) hypertension: Secondary | ICD-10-CM | POA: Diagnosis not present

## 2021-05-01 DIAGNOSIS — N281 Cyst of kidney, acquired: Secondary | ICD-10-CM | POA: Diagnosis not present

## 2021-05-01 DIAGNOSIS — D649 Anemia, unspecified: Secondary | ICD-10-CM | POA: Diagnosis not present

## 2021-05-01 DIAGNOSIS — Z79899 Other long term (current) drug therapy: Secondary | ICD-10-CM | POA: Diagnosis not present

## 2021-05-01 DIAGNOSIS — C9110 Chronic lymphocytic leukemia of B-cell type not having achieved remission: Secondary | ICD-10-CM | POA: Diagnosis present

## 2021-05-01 NOTE — Patient Instructions (Signed)
Access Code: [email or web address] URL: [email or web address] Date: 05/01/2021 Prepared by: Rudell Cobb  Program Notes Muscle Roller:  gastroc, hamstring, outside of thigh Keep doing same exercises from [facility] and we will progress.   Exercises Quad Setting and Stretching - 2 x daily - 7 x weekly - 1 sets - 15 reps Supine Active Straight Leg Raise - 2 x daily - 7 x weekly - 1 sets - 5-10 reps Seated Heel Slide - 2 x daily - 7 x weekly - 1 sets - 10 reps Seated Hamstring Stretch with Chair - 2 x daily - 7 x weekly - 1 sets - 2 reps - 30 seconds hold Gastroc Stretch on Wall - 2 x daily - 7 x weekly - 1 sets - 2 reps - 30 seconds hold

## 2021-05-01 NOTE — Therapy (Signed)
[place], [place], [address] Phone: [phone]   Fax:  [phone]  Physical Therapy Evaluation  Patient Details  Name: Kevin Patterson MRN: [id number] Date of Birth: 1955-09-10 Referring Provider (PT): Marcene Duos, MD   Encounter Date: 05/01/2021   PT End of Session - 05/01/21 1234     Visit Number 1    Number of Visits 13    Date for PT Re-Evaluation 06/15/21    Authorization Type aetna--60 visits/year $40 copay    PT Start Time 1155    PT Stop Time 7893    PT Time Calculation (min) 40 min    Activity Tolerance Patient tolerated treatment well    Behavior During Therapy [facility] for tasks assessed/performed             Past Medical History:  Diagnosis Date   Arthritis    Cyst of kidney, acquired    history of , monitored   Hypertension     Past Surgical History:  Procedure Laterality Date   APPENDECTOMY     BACK SURGERY     JOINT REPLACEMENT     left knee   KNEE ARTHROTOMY  12/28/1968   open cleanout   TONSILLECTOMY     TOTAL KNEE ARTHROPLASTY Left 03/13/2015   Procedure: LEFT TOTAL KNEE ARTHROPLASTY;  Surgeon: Elsie Saas, MD;  Location: [facility];  Service: Orthopedics;  Laterality: Left;   TOTAL KNEE ARTHROPLASTY Right 04/10/2021   Procedure: RIGHT TOTAL KNEE ARTHROPLASTY;  Surgeon: Meredith Pel, MD;  Location: [facility];  Service: Orthopedics;  Laterality: Right;    There were no vitals filed for this visit.    Subjective Assessment - 05/01/21 1158     Subjective The patient is s/p R TKR 04/10/21.  He has h/o low back pain that flared in November and he reports his painful gait was leading to low back pain.  He saw MD 04/19/21 for post surgical follow up.    Pertinent History arthritis, cyst of kidney, HTN    Patient Stated Goals I want to be walking and get back to work.    Currently in Pain? Yes    Pain Score 3     Pain Location Knee    Pain Orientation Right     Pain Descriptors / Indicators Aching;Discomfort    Pain Type Surgical pain    Pain Onset 1 to 4 weeks ago    Pain Frequency Intermittent    Aggravating Factors  activity    Pain Relieving Factors ice    Multiple Pain Sites Yes    Pain Score 0    Pain Location Back    Effect of Pain on Daily Activities *sleeps on pillows under knees (typically) and he is having a hard time sleeping because of positioning for his back                [facility] PT Assessment - 05/01/21 1159       Assessment   Medical Diagnosis R TKR    Referring Provider (PT) Marcene Duos, MD    Onset Date/Surgical Date 04/10/21    Hand Dominance Right    Prior Therapy acute PT, HH PT d/c last week      Precautions   Precautions --   using RW for safety at this time     Restrictions   Weight Bearing Restrictions Yes    Other Position/Activity Restrictions WBAT      Balance Screen   Has  the patient fallen in the past 6 months No    Has the patient had a decrease in activity level because of a fear of falling?  No    Is the patient reluctant to leave their home because of a fear of falling?  No      Home Environment   Living Environment Private residence    Living Arrangements Spouse/significant other    Home Access Stairs to enter    Entrance Stairs-Number of Steps 2    [place] to live on main level with bedroom/bathroom;Two level    [place] - 2 wheels      Prior Function   Level of Independence Independent   with low back pain, R knee pain   Vocation Full time employment    Vocation Requirements --   walking unlevel ground as land surveying/development     Observation/Other Assessments   Focus on Therapeutic Outcomes (FOTO)  29%      Sensation   Light Touch Appears Intact      ROM / Strength   AROM / PROM / Strength AROM;PROM;Strength      AROM   Overall AROM  Deficits    Overall AROM Comments In sitting:  AAROM to 88 degrees R knee flexion, -38 degree extensor lag in  LAQ.  L knee A/ROM 118 degrees.    AROM Assessment Site Knee    Right/Left Knee Right;Left    Right Knee Extension -38    Right Knee Flexion 88    Left Knee Flexion 118      PROM   Overall PROM  Deficits    Overall PROM Comments R knee -10 degrees supine with heel on pillow, R knee flexion 90 in supine with gravity assisted    PROM Assessment Site Knee    Right/Left Knee Right    Right Knee Extension -10    Right Knee Flexion 90      Strength   Overall Strength Deficits    Overall Strength Comments decreased      Palpation   Patella mobility hypomobility-- to be further assessed    Palpation comment Scar tissue tightness noted proximal patellar region      Transfers   Transfers Sit to Stand;Stand to Sit    Sit to Stand 6: Modified independent (Device/Increase time)    Stand to Sit 6: Modified independent (Device/Increase time)    Comments with RW for support      Ambulation/Gait   Ambulation/Gait Yes    Ambulation/Gait Assistance 6: Modified independent (Device/Increase time)    Ambulation Distance (Feet) 100 Feet    Assistive device Rolling walker    Gait Pattern Step-through pattern;Decreased stance time - right;Decreased hip/knee flexion - right;Decreased weight shift to right   decreased R knee extension at heel contact   Ambulation Surface Level;Indoor    Gait velocity 0.98 ft/sec with RW    Stairs --   TBA                       Objective measurements completed on examination: See above findings.       [facility] Adult PT Treatment/Exercise - 05/01/21 1221       Self-Care   Self-Care Other Self-Care Comments    Other Self-Care Comments  soft tissue rolling calf and HS and IT band      Exercises   Exercises Knee/Hip      Knee/Hip Exercises: Stretches   Active Hamstring Stretch Right;1  rep;30 seconds    Passive Hamstring Stretch Right;1 rep;60 seconds    Passive Hamstring Stretch Limitations adding heel cord stretch    Gastroc Stretch Right;1  rep;20 seconds      Knee/Hip Exercises: Seated   Heel Slides AAROM;Right;5 reps      Knee/Hip Exercises: Supine   Quad Sets Strengthening;Right;10 reps    Heel Slides AROM;Right;10 reps                     PT Education - 05/01/21 1638     Education Details HEP    Person(s) Educated Patient    Methods Explanation;Demonstration;Handout    Comprehension Verbalized understanding;Returned demonstration                 PT Long Term Goals - 05/01/21 1639       PT LONG TERM GOAL #1   Title The patient will be indep with HEP for R LE strengthening, general mobility, stretching.    Time 6    Period Weeks    Target Date 06/12/21      PT LONG TERM GOAL #2   Title The patient will improve R knee AROM to 105 degrees flexion.    Baseline 88 degrees    Time 6    Period Weeks    Target Date 06/12/21      PT LONG TERM GOAL #3   Title The patient will demonstrate LAQ with < 15 degrees extensor lag.    Baseline -38 degrees seated LAQ    Time 6    Period Weeks    Target Date 06/12/21      PT LONG TERM GOAL #4   Title The patient will improve gait to ambulate no device x 500 feet mod indep.    Time 6    Period Weeks    Target Date 06/12/21      PT LONG TERM GOAL #5   Title The patient will imrpove gait speed to > or equal to 2.6 ft/sec for community ambulation gait speed.    Time 6    Period Weeks    Target Date 06/12/21      Additional Long Term Goals   Additional Long Term Goals Yes      PT LONG TERM GOAL #6   Title The patient will negotiate stairs in home to access both levels mod indep with one handrail.    Time 6    Period Weeks    Target Date 06/12/21      PT LONG TERM GOAL #7   Title Improve FOTO to 66%.    Time 6    Period Weeks    Target Date 06/12/21                    Plan - 05/01/21 1656     Clinical Impression Statement The patient is a 66 yo male s/p R TKR 04/10/21 presenting to OP PT with dec'd AROM, dec'd PROM, tightness  and pain in R medial HS/gastrocs, dec'd flexibility, muscle weakness, and abnormality of gait.  PT to address deficits to promote improved mobility and return to prior functional status including gym routine and work activities.    Personal Factors and Comorbidities Comorbidity 2    Comorbidities h/o L TKR, h/o low back surgery    Examination-Activity Limitations Bend;Stairs;Stand;Locomotion Level;Sleep;Squat    Examination-Participation Restrictions Occupation;Cleaning;Community Activity;Driving    Stability/Clinical Decision Making Stable/Uncomplicated    Clinical Decision Making Low    Rehab  Potential Good    PT Frequency 2x / week    PT Duration 6 weeks    PT Treatment/Interventions ADLs/Self Care Home Management;Patient/family education;Gait training;Stair training;Functional mobility training;Therapeutic activities;Aquatic Therapy;Cryotherapy;Vasopneumatic Device;Therapeutic exercise;Balance training;Neuromuscular re-education;Passive range of motion;Scar mobilization;Moist Heat;Electrical Stimulation;Taping;Manual techniques;Dry needling    PT Next Visit Plan progress HEP, progress gait working on dec'd reliance on RW and normalizing gait pattern, push quad control and extension for gait    PT Home Exercise Plan [id number]    Consulted and Agree with Plan of Care Patient             Patient will benefit from skilled therapeutic intervention in order to improve the following deficits and impairments:  Abnormal gait, Decreased range of motion, Increased fascial restricitons, Impaired tone, Pain, Decreased balance, Decreased scar mobility, Hypomobility, Impaired flexibility, Decreased strength, Increased edema  Visit Diagnosis: Acute pain of right knee  Muscle weakness (generalized)  Other abnormalities of gait and mobility  Localized edema     Problem List Patient Active Problem List   Diagnosis Date Noted   Arthritis of right knee    S/P total knee replacement, right  04/10/2021   DJD (degenerative joint disease) of knee 03/13/2015    Jamere Stidham, PT 05/01/2021, 5:01 PM  [facility] [place], [place], [address] Phone: [phone]   Fax:  [phone]  Name: Kevin Patterson MRN: [id number] Date of Birth: 06-28-1955

## 2021-05-02 ENCOUNTER — Ambulatory Visit: Payer: 59 | Admitting: Physical Therapy

## 2021-05-02 ENCOUNTER — Encounter: Payer: Self-pay | Admitting: Physical Therapy

## 2021-05-02 DIAGNOSIS — R600 Localized edema: Secondary | ICD-10-CM

## 2021-05-02 DIAGNOSIS — M6281 Muscle weakness (generalized): Secondary | ICD-10-CM

## 2021-05-02 DIAGNOSIS — R2689 Other abnormalities of gait and mobility: Secondary | ICD-10-CM

## 2021-05-02 DIAGNOSIS — M25561 Pain in right knee: Secondary | ICD-10-CM

## 2021-05-02 DIAGNOSIS — C9110 Chronic lymphocytic leukemia of B-cell type not having achieved remission: Secondary | ICD-10-CM | POA: Diagnosis not present

## 2021-05-02 NOTE — Discharge Summary (Signed)
Physician Discharge Summary      Patient ID: Kevin Patterson MRN: [id number] DOB/AGE: Oct 08, 1955 66 y.o.  Admit date: 04/10/2021 Discharge date: 04/11/2021  Admission Diagnoses:  Principal Problem:   S/P total knee replacement, right Active Problems:   Arthritis of right knee   Discharge Diagnoses:  Same  Surgeries: Procedure(s): RIGHT TOTAL KNEE ARTHROPLASTY on 04/10/2021   Consultants:   Discharged Condition: Stable  Hospital Course: Kevin Patterson is an 66 y.o. male who was admitted 04/10/2021 with a chief complaint of right knee pain, and found to have a diagnosis of right knee osteoarthritis.  They were brought to the operating room on 04/10/2021 and underwent the above named procedures.  Pt awoke from anesthesia without complication and was transferred to the floor. On POD1, patient was able to mobilize well with physical therapy.  Had excellent quad control and able to perform straight leg raise.  No complaint of chest pain, shortness of breath, calf pain, dizziness during ambulation.  He was discharged home on POD 1..  Pt will f/u with Dr. Marlou Sa in clinic in ~2 weeks.   Antibiotics given:  Anti-infectives (From admission, onward)    Start     Dose/Rate Route Frequency Ordered Stop   04/10/21 2200  ceFAZolin (ANCEF) IVPB 2g/100 mL premix        2 g 200 mL/hr over 30 Minutes Intravenous Every 8 hours 04/10/21 1814 04/11/21 0601   04/10/21 1119  vancomycin (VANCOCIN) powder  Status:  Discontinued          As needed 04/10/21 1119 04/10/21 1351   04/10/21 0830  ceFAZolin (ANCEF) IVPB 2g/100 mL premix        2 g 200 mL/hr over 30 Minutes Intravenous On call to O.R. 04/10/21 4128 04/10/21 1112     .  Recent vital signs:  Vitals:   04/11/21 0733 04/11/21 1606  BP: 123/68 125/74  Pulse: 76 78  Resp: 18 20  Temp: 98.5 F (36.9 C) 98.9 F (37.2 C)  SpO2: 100% 99%    Recent laboratory studies:  Results for orders placed or performed during the hospital  encounter of 04/10/21  Urine Culture   Specimen: Urine, Clean Catch  Result Value Ref Range   Specimen Description URINE, CLEAN CATCH    Special Requests NONE    Culture      NO GROWTH Performed at [facility], [place] [address] [place], [place] [address]    Report Status 04/11/2021 FINAL   CBC  Result Value Ref Range   WBC 16.7 (H) 4.0 - 10.5 K/uL   RBC 4.44 4.22 - 5.81 MIL/uL   Hemoglobin 12.9 (L) 13.0 - 17.0 g/dL   HCT 40.8 39.0 - 52.0 %   MCV 91.9 80.0 - 100.0 fL   MCH 29.1 26.0 - 34.0 pg   MCHC 31.6 30.0 - 36.0 g/dL   RDW 12.7 11.5 - 15.5 %   Platelets 180 150 - 400 K/uL   nRBC 0.0 0.0 - 0.2 %  Basic metabolic panel  Result Value Ref Range   Sodium 135 135 - 145 mmol/L   Potassium 4.4 3.5 - 5.1 mmol/L   Chloride 106 98 - 111 mmol/L   CO2 23 22 - 32 mmol/L   Glucose, Bld 102 (H) 70 - 99 mg/dL   BUN 13 8 - 23 mg/dL   Creatinine, Ser 1.00 0.61 - 1.24 mg/dL   Calcium 9.2 8.9 - 10.3 mg/dL   GFR, Estimated >60 >60 mL/min   Anion  gap 6 5 - 15  Urinalysis, Routine w reflex microscopic  Result Value Ref Range   Color, Urine YELLOW YELLOW   APPearance CLEAR CLEAR   Specific Gravity, Urine 1.010 1.005 - 1.030   pH 6.0 5.0 - 8.0   Glucose, UA NEGATIVE NEGATIVE mg/dL   Hgb urine dipstick TRACE (A) NEGATIVE   Bilirubin Urine NEGATIVE NEGATIVE   Ketones, ur NEGATIVE NEGATIVE mg/dL   Protein, ur NEGATIVE NEGATIVE mg/dL   Nitrite NEGATIVE NEGATIVE   Leukocytes,Ua NEGATIVE NEGATIVE  Flow Cytometry, Peripheral Blood (Oncology)  Result Value Ref Range   Flow Cytometry SEE SEPARATE REPORT   Urinalysis, Microscopic (reflex)  Result Value Ref Range   RBC / HPF 0-5 0 - 5 RBC/hpf   WBC, UA 0-5 0 - 5 WBC/hpf   Bacteria, UA NONE SEEN NONE SEEN   Squamous Epithelial / LPF NONE SEEN 0 - 5   Mucus PRESENT   CBC  Result Value Ref Range   WBC 22.4 (H) 4.0 - 10.5 K/uL   RBC 3.85 (L) 4.22 - 5.81 MIL/uL   Hemoglobin 11.0 (L) 13.0 - 17.0 g/dL   HCT 35.2 (L) 39.0 - 52.0 %   MCV  91.4 80.0 - 100.0 fL   MCH 28.6 26.0 - 34.0 pg   MCHC 31.3 30.0 - 36.0 g/dL   RDW 12.8 11.5 - 15.5 %   Platelets 182 150 - 400 K/uL   nRBC 0.2 0.0 - 0.2 %  Basic metabolic panel  Result Value Ref Range   Sodium 133 (L) 135 - 145 mmol/L   Potassium 4.3 3.5 - 5.1 mmol/L   Chloride 101 98 - 111 mmol/L   CO2 27 22 - 32 mmol/L   Glucose, Bld 123 (H) 70 - 99 mg/dL   BUN 11 8 - 23 mg/dL   Creatinine, Ser 1.14 0.61 - 1.24 mg/dL   Calcium 8.8 (L) 8.9 - 10.3 mg/dL   GFR, Estimated >60 >60 mL/min   Anion gap 5 5 - 15  Surgical pathology  Result Value Ref Range   SURGICAL PATHOLOGY      Surgical Pathology CASE: [id number] PATIENT: Kingstyn Rumbold Flow Pathology Report     Clinical history: lymphocytosis   DIAGNOSIS:  -  Monoclonal B-cell population with co-expression of CD5 comprises 88% of all lymphocytes -  See comment  COMMENT:  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 Given the presence of CD5 expression, the differential diagnosis includes chronic lymphocytic leukemia/small lymphocytic lymphoma and mantle cell lymphoma.  GATING AND PHENOTYPIC ANALYSIS:  Gated population: Flow cytometric immunophenotyping is performed using antibodies to the antigens listed in the table below. Electronic gates are placed around a cell cluster displaying light scatter properties corresponding to: lymphocytes  Abnormal Cells in gated population: 88%  Phenotype of Abnormal Cells: CD5, CD19, CD20, CD200, Kappa                         Lymphoid Antigens       Myeloid Antigens Miscellaneous CD2   NEG  CD10 NEG  CD11b     ND   CD45 POS CD3  NEG  CD19 POS  CD11c     ND   HLA-Dr    ND CD4  NEG  CD20 POS  CD13 ND   CD34 NEG CD5  POS  CD22 ND   CD14 ND   CD38 NEG CD7  NEG  CD79b     ND  CD15 ND   CD138     ND CD8  NEG  CD103     ND   CD16 ND   TdT  ND CD25 ND   CD200     POS  CD33 ND   CD123     ND TCRab     ND   sKappa    POS   CD64 ND   CD41 ND TCRgd     NEG  sLambda   NEG  CD117     ND   CD61 ND CD56 NEG  cKappa    ND   MPO  ND   CD71 ND CD57 ND   cLambda   ND        [id number]   GROSS DESCRIPTION:  One lavender top tube submitted from [facility] for lymphoma testing.    Final Diagnosis performed by Thressa Sheller, MD.   Electronically signed 04/10/2021 Technical component performed at [facility], [place] [address], [place], [place] [address].  Professional component performed at [employer], [place] [address], [place], [place] [address].     Discharge Medications:   Allergies as of 04/11/2021   No Known Allergies      Medication List     TAKE these medications    Ashwagandha 500 MG Caps Take 500 mg by mouth daily.   aspirin 81 MG chewable tablet Chew 1 tablet (81 mg total) by mouth 2 (two) times daily.   CALCIUM + D PO Take 1 tablet by mouth daily.   cholecalciferol 25 MCG (1000 UNIT) tablet Commonly known as: VITAMIN D3 Take 1,000 Units by mouth daily.   Flaxseed Oil 1000 MG Caps Take 2,000 mg by mouth daily.   L-Arginine 500 MG Caps Take 500 mg by mouth daily.   methocarbamol 500 MG tablet Commonly known as: ROBAXIN Take 1 tablet (500 mg total) by mouth every 8 (eight) hours as needed for muscle spasms.   milk thistle 175 MG tablet Take 175 mg by mouth daily.   multivitamin with minerals tablet Take 1 tablet by mouth daily.   olmesartan-hydrochlorothiazide 20-12.5 MG tablet Commonly known as: BENICAR HCT Take 1 tablet by mouth daily.   OVER THE COUNTER MEDICATION Apply 1 application topically 2 (two) times daily as needed (pain relief). CBD Oil with Emu Oil 500 mg   oxyCODONE 5 MG immediate release tablet Commonly known as: Oxy IR/ROXICODONE Take 1-2 tablets (5-10 mg total) by mouth every 4 (four) hours as needed for moderate pain (pain score 4-6).   tadalafil 5 MG tablet Commonly known as: CIALIS Take 5 mg by mouth daily as  needed for erectile dysfunction.   Vitamin A 2400 MCG (8000 UT) Caps Take 8,000 Units by mouth daily.   vitamin C 1000 MG tablet Take 1,000 mg by mouth daily.   zinc gluconate 50 MG tablet Take 50 mg by mouth daily.        Diagnostic Studies: XR Knee 1-2 Views Right  Result Date: 04/23/2021 AP lateral radiographs right knee reviewed.  Total knee prosthesis in good position alignment with no complicating features.  No acute fracture.  Patella height normal relative to distal femur.   Disposition: Discharge disposition: 01-Home or Self Care       Discharge Instructions     Call MD / Call 911   Complete by: As directed    If you experience chest pain or shortness of breath, CALL 911 and be transported to the hospital emergency  room.  If you develope a fever above 101 F, pus (white drainage) or increased drainage or redness at the wound, or calf pain, call your surgeon's office.  ° Constipation Prevention   Complete by: As directed °  ° Drink plenty of fluids.  Prune juice may be helpful.  You may use a stool softener, such as Colace (over the counter) 100 mg twice a day.  Use MiraLax (over the counter) for constipation as needed.  ° Diet - low sodium heart healthy   Complete by: As directed °  ° Discharge instructions   Complete by: As directed °  ° You may shower, dressing is waterproof.  Do not remove the dressing, we will remove it at your first post-op appointment.  Do not take a bath or soak the knee in a tub or pool.  You may weightbear as you can tolerate on the operative leg with a walker.  Continue using the CPM machine 3 times per day for one hour each time, increasing the degrees of range of motion daily.  Use the blue cradle boot under your heel to work on getting your leg straight as much as you can.  Do NOT put a pillow under your knee.  You will follow-up with Dr. Dean in the clinic in 2 weeks at your given appointment date.  Call the office with any questions or concerns  at [phone].  ° °INSTRUCTIONS AFTER JOINT REPLACEMENT  ° °Remove items at home which could result in a fall. This includes throw rugs or furniture in walking pathways °ICE to the affected joint every three hours while awake for 30 minutes at a time, for at least the first 3-5 days, and then as needed for pain and swelling.  Continue to use ice for pain and swelling. You may notice swelling that will progress down to the foot and ankle.  This is normal after surgery.  Elevate your leg when you are not up walking on it.   °Continue to use the breathing machine you got in the hospital (incentive spirometer) which will help keep your temperature down.  It is common for your temperature to cycle up and down following surgery, especially at night when you are not up moving around and exerting yourself.  The breathing machine keeps your lungs expanded and your temperature down. ° ° °DIET:  As you were doing prior to hospitalization, we recommend a well-balanced diet. ° °DRESSING / WOUND CARE / SHOWERING ° °Keep the surgical dressing until follow up.  The dressing is water proof, so you can shower without any extra covering.  IF THE DRESSING FALLS OFF or the wound gets wet inside, change the dressing with sterile gauze.  Please use good hand washing techniques before changing the dressing.  Do not use any lotions or creams on the incision until instructed by your surgeon.   ° °ACTIVITY ° °Increase activity slowly as tolerated, but follow the weight bearing instructions below.   °No driving for 6 weeks or until further direction given by your physician.  You cannot drive while taking narcotics.  °No lifting or carrying greater than 10 lbs. until further directed by your surgeon. °Avoid periods of inactivity such as sitting longer than an hour when not asleep. This helps prevent blood clots.  °You may return to work once you are authorized by your doctor.  ° ° ° °WEIGHT BEARING  ° °Weight bearing as tolerated with assist  device (walker, cane, etc) as directed, use it as   long as suggested by your surgeon or therapist, typically at least 4-6 weeks. ° ° °EXERCISES ° °Results after joint replacement surgery are often greatly improved when you follow the exercise, range of motion and muscle strengthening exercises prescribed by your doctor. Safety measures are also important to protect the joint from further injury. Any time any of these exercises cause you to have increased pain or swelling, decrease what you are doing until you are comfortable again and then slowly increase them. If you have problems or questions, call your caregiver or physical therapist for advice.  ° °Rehabilitation is important following a joint replacement. After just a few days of immobilization, the muscles of the leg can become weakened and shrink (atrophy).  These exercises are designed to build up the tone and strength of the thigh and leg muscles and to improve motion. Often times heat used for twenty to thirty minutes before working out will loosen up your tissues and help with improving the range of motion but do not use heat for the first two weeks following surgery (sometimes heat can increase post-operative swelling).  ° °These exercises can be done on a training (exercise) mat, on the floor, on a table or on a bed. Use whatever works the best and is most comfortable for you.    Use music or television while you are exercising so that the exercises are a pleasant break in your day. This will make your life better with the exercises acting as a break in your routine that you can look forward to.   Perform all exercises about fifteen times, three times per day or as directed.  You should exercise both the operative leg and the other leg as well. ° °Exercises include: °  °Quad Sets - Tighten up the muscle on the front of the thigh (Quad) and hold for 5-10 seconds.   °Straight Leg Raises - With your knee straight (if you were given a brace, keep it on),  lift the leg to 60 degrees, hold for 3 seconds, and slowly lower the leg.  Perform this exercise against resistance later as your leg gets stronger.  °Leg Slides: Lying on your back, slowly slide your foot toward your buttocks, bending your knee up off the floor (only go as far as is comfortable). Then slowly slide your foot back down until your leg is flat on the floor again.  °Angel Wings: Lying on your back spread your legs to the side as far apart as you can without causing discomfort.  °Hamstring Strength:  Lying on your back, push your heel against the floor with your leg straight by tightening up the muscles of your buttocks.  Repeat, but this time bend your knee to a comfortable angle, and push your heel against the floor.  You may put a pillow under the heel to make it more comfortable if necessary.  ° °A rehabilitation program following joint replacement surgery can speed recovery and prevent re-injury in the future due to weakened muscles. Contact your doctor or a physical therapist for more information on knee rehabilitation.  ° ° °CONSTIPATION ° °Constipation is defined medically as fewer than three stools per week and severe constipation as less than one stool per week.  Even if you have a regular bowel pattern at home, your normal regimen is likely to be disrupted due to multiple reasons following surgery.  Combination of anesthesia, postoperative narcotics, change in appetite and fluid intake all can affect your bowels.  ° °YOU MUST   use at least one of the following options; they are listed in order of increasing strength to get the job done.  They are all available over the counter, and you may need to use some, POSSIBLY even all of these options:    Drink plenty of fluids (prune juice may be helpful) and high fiber foods Colace 100 mg by mouth twice a day  Senokot for constipation as directed and as needed Dulcolax (bisacodyl), take with full glass of water  Miralax (polyethylene glycol) once  or twice a day as needed.  If you have tried all these things and are unable to have a bowel movement in the first 3-4 days after surgery call either your surgeon or your primary doctor.    If you experience loose stools or diarrhea, hold the medications until you stool forms back up.  If your symptoms do not get better within 1 week or if they get worse, check with your doctor.  If you experience "the worst abdominal pain ever" or develop nausea or vomiting, please contact the office immediately for further recommendations for treatment.   ITCHING:  If you experience itching with your medications, try taking only a single pain pill, or even half a pain pill at a time.  You can also use Benadryl over the counter for itching or also to help with sleep.   TED HOSE STOCKINGS:  Use stockings on both legs until for at least 2 weeks or as directed by physician office. They may be removed at night for sleeping.  MEDICATIONS:  See your medication summary on the "After Visit Summary" that nursing will review with you.  You may have some home medications which will be placed on hold until you complete the course of blood thinner medication.  It is important for you to complete the blood thinner medication as prescribed.  PRECAUTIONS:  If you experience chest pain or shortness of breath - call 911 immediately for transfer to the hospital emergency department.   If you develop a fever greater that 101 F, purulent drainage from wound, increased redness or drainage from wound, foul odor from the wound/dressing, or calf pain - CONTACT YOUR SURGEON.                                                   FOLLOW-UP APPOINTMENTS:  If you do not already have a post-op appointment, please call the office for an appointment to be seen by your surgeon.  Guidelines for how soon to be seen are listed in your "After Visit Summary", but are typically between 1-4 weeks after surgery.  OTHER INSTRUCTIONS:   Knee Replacement:  Do  not place pillow under knee, focus on keeping the knee straight while resting. CPM instructions: 0-90 degrees, 2 hours in the morning, 2 hours in the afternoon, and 2 hours in the evening. Place foam block, curve side up under heel at all times except when in CPM or when walking.  DO NOT modify, tear, cut, or change the foam block in any way.  POST-OPERATIVE OPIOID TAPER INSTRUCTIONS: It is important to wean off of your opioid medication as soon as possible. If you do not need pain medication after your surgery it is ok to stop day one. Opioids include: Codeine, Hydrocodone(Norco, Vicodin), Oxycodone(Percocet, oxycontin) and hydromorphone amongst others.  Long term and even short  term use of opiods can cause: °Increased pain response °Dependence °Constipation °Depression °Respiratory depression °And more.  °Withdrawal symptoms can include °Flu like symptoms °Nausea, vomiting °And more °Techniques to manage these symptoms °Hydrate well °Eat regular healthy meals °Stay active °Use relaxation techniques(deep breathing, meditating, yoga) °Do Not substitute Alcohol to help with tapering °If you have been on opioids for less than two weeks and do not have pain than it is ok to stop all together.  °Plan to wean off of opioids °This plan should start within one week post op of your joint replacement. °Maintain the same interval or time between taking each dose and first decrease the dose.  °Cut the total daily intake of opioids by one tablet each day °Next start to increase the time between doses. °The last dose that should be eliminated is the evening dose.  ° °MAKE SURE YOU:  °Understand these instructions.  °Get help right away if you are not doing well or get worse.  ° ° °Thank you for letting us be a part of your medical care team.  It is a privilege we respect greatly.  We hope these instructions will help you stay on track for a fast and full recovery!  ° ° °Dental Antibiotics: ° °In most cases prophylactic  antibiotics for Dental procdeures after total joint surgery are not necessary. ° °Exceptions are as follows: ° °1. History of prior total joint infection ° °2. Severely immunocompromised (Organ Transplant, cancer chemotherapy, Rheumatoid biologic °meds such as Humera) ° °3. Poorly controlled diabetes (A1C &gt; 8.0, blood glucose over 200) ° °If you have one of these conditions, contact your surgeon for an antibiotic prescription, prior to your °dental procedure.  ° Increase activity slowly as tolerated   Complete by: As directed °  ° Post-operative opioid taper instructions:   Complete by: As directed °  ° POST-OPERATIVE OPIOID TAPER INSTRUCTIONS: °It is important to wean off of your opioid medication as soon as possible. If you do not need pain medication after your surgery it is ok to stop day one. °Opioids include: °Codeine, Hydrocodone(Norco, Vicodin), Oxycodone(Percocet, oxycontin) and hydromorphone amongst others.  °Long term and even short term use of opiods can cause: °Increased pain response °Dependence °Constipation °Depression °Respiratory depression °And more.  °Withdrawal symptoms can include °Flu like symptoms °Nausea, vomiting °And more °Techniques to manage these symptoms °Hydrate well °Eat regular healthy meals °Stay active °Use relaxation techniques(deep breathing, meditating, yoga) °Do Not substitute Alcohol to help with tapering °If you have been on opioids for less than two weeks and do not have pain than it is ok to stop all together.  °Plan to wean off of opioids °This plan should start within one week post op of your joint replacement. °Maintain the same interval or time between taking each dose and first decrease the dose.  °Cut the total daily intake of opioids by one tablet each day °Next start to increase the time between doses. °The last dose that should be eliminated is the evening dose.  ° °  ° °  ° ° ° ° ° °Signed: ° L  °05/02/2021, 8:28 AM ° ° ° °

## 2021-05-02 NOTE — Therapy (Signed)
[place], [place], [address] Phone: [phone]   Fax:  [phone]  Physical Therapy Treatment  Patient Details  Name: Kevin Patterson MRN: [id number] Date of Birth: 10-01-1955 Referring Provider (PT): Marcene Duos, MD   Encounter Date: 05/02/2021   PT End of Session - 05/02/21 1108     Visit Number 2    Number of Visits 13    Date for PT Re-Evaluation 06/15/21    Authorization Type aetna--60 visits/year $40 copay    PT Start Time 1102    PT Stop Time 1140    PT Time Calculation (min) 38 min    Activity Tolerance Patient tolerated treatment well    Behavior During Therapy [facility] for tasks assessed/performed             Past Medical History:  Diagnosis Date   Arthritis    Cyst of kidney, acquired    history of , monitored   Hypertension     Past Surgical History:  Procedure Laterality Date   APPENDECTOMY     BACK SURGERY     JOINT REPLACEMENT     left knee   KNEE ARTHROTOMY  12/28/1968   open cleanout   TONSILLECTOMY     TOTAL KNEE ARTHROPLASTY Left 03/13/2015   Procedure: LEFT TOTAL KNEE ARTHROPLASTY;  Surgeon: Elsie Saas, MD;  Location: [place];  Service: Orthopedics;  Laterality: Left;   TOTAL KNEE ARTHROPLASTY Right 04/10/2021   Procedure: RIGHT TOTAL KNEE ARTHROPLASTY;  Surgeon: Meredith Pel, MD;  Location: [place];  Service: Orthopedics;  Laterality: Right;    There were no vitals filed for this visit.   Subjective Assessment - 05/02/21 1108     Subjective Pt reports no new changes since yesterday.  "Just stiffness".    Pertinent History arthritis, cyst of kidney, HTN    Patient Stated Goals I want to be walking and get back to work.    Currently in Pain? No/denies    Pain Score 0-No pain                [facility] PT Assessment - 05/02/21 0001       Assessment   Medical Diagnosis R TKR    Referring Provider (PT) Marcene Duos, MD    Onset Date/Surgical  Date 04/10/21    Hand Dominance Right    Prior Therapy acute PT, HH PT d/c last week      PROM   Right Knee Flexion 96   seated scoot              [facility] Adult PT Treatment/Exercise - 05/02/21 0001       Knee/Hip Exercises: Stretches   Passive Hamstring Stretch Right;1 rep;30 seconds   seated with back straight   Hip Flexor Stretch Right;2 reps;30 seconds   thomas position   Gastroc Stretch Right;2 reps;20 seconds   standing; runner's stretch     Knee/Hip Exercises: Aerobic   Nustep L5: slow motion, 5 min for ROM.      Knee/Hip Exercises: Standing   Other Standing Knee Exercises slow high knee marching in place with hands on RW x 10    Other Standing Knee Exercises sit to stand from elevated surface with RW x 10      Knee/Hip Exercises: Seated   Long Arc Quad AAROM;Right;1 set;10 reps    Other Seated Knee/Hip Exercises seated scoot x 5 sec hold, 10 reps      Knee/Hip Exercises: Supine  Quad Sets Strengthening;Right;5 reps;2 sets   5 sec hold; tactile cues.   Heel Slides 10 reps    Straight Leg Raises Right;2 sets;AAROM;5 reps            Single laps around gym in between exercises to decrease stiffness (with RW),supervision.    PT Long Term Goals - 05/01/21 1639       PT LONG TERM GOAL #1   Title The patient will be indep with HEP for R LE strengthening, general mobility, stretching.    Time 6    Period Weeks    Target Date 06/12/21      PT LONG TERM GOAL #2   Title The patient will improve R knee AROM to 105 degrees flexion.    Baseline 88 degrees    Time 6    Period Weeks    Target Date 06/12/21      PT LONG TERM GOAL #3   Title The patient will demonstrate LAQ with < 15 degrees extensor lag.    Baseline -38 degrees seated LAQ    Time 6    Period Weeks    Target Date 06/12/21      PT LONG TERM GOAL #4   Title The patient will improve gait to ambulate no device x 500 feet mod indep.    Time 6    Period Weeks    Target Date 06/12/21      PT LONG  TERM GOAL #5   Title The patient will imrpove gait speed to > or equal to 2.6 ft/sec for community ambulation gait speed.    Time 6    Period Weeks    Target Date 06/12/21      Additional Long Term Goals   Additional Long Term Goals Yes      PT LONG TERM GOAL #6   Title The patient will negotiate stairs in home to access both levels mod indep with one handrail.    Time 6    Period Weeks    Target Date 06/12/21      PT LONG TERM GOAL #7   Title Improve FOTO to 66%.    Time 6    Period Weeks    Target Date 06/12/21                   Plan - 05/02/21 1146     Clinical Impression Statement Pt's Rt knee ROM gradually improving.  Pt requires AAROM for SLR and LAQ for RLE at this time.  Improved recruitment of Rt quad with repetition.  Pt tolerated exercises well, and reported reduction of tightness in Rt thigh/knee at end of session.  Goals are ongoing.    Personal Factors and Comorbidities Comorbidity 2    Comorbidities h/o L TKR, h/o low back surgery    Examination-Activity Limitations Bend;Stairs;Stand;Locomotion Level;Sleep;Squat    Examination-Participation Restrictions Occupation;Cleaning;Community Activity;Driving    Stability/Clinical Decision Making Stable/Uncomplicated    Rehab Potential Good    PT Frequency 2x / week    PT Duration 6 weeks    PT Treatment/Interventions ADLs/Self Care Home Management;Patient/family education;Gait training;Stair training;Functional mobility training;Therapeutic activities;Aquatic Therapy;Cryotherapy;Vasopneumatic Device;Therapeutic exercise;Balance training;Neuromuscular re-education;Passive range of motion;Scar mobilization;Moist Heat;Electrical Stimulation;Taping;Manual techniques;Dry needling    PT Next Visit Plan progress HEP, progress gait working on dec'd reliance on RW and normalizing gait pattern, push quad control and extension for gait  (NO Vaso -not covered)    PT Home Exercise Plan [id number]    Consulted and Agree with Plan  of Care Patient             Patient will benefit from skilled therapeutic intervention in order to improve the following deficits and impairments:  Abnormal gait, Decreased range of motion, Increased fascial restricitons, Impaired tone, Pain, Decreased balance, Decreased scar mobility, Hypomobility, Impaired flexibility, Decreased strength, Increased edema  Visit Diagnosis: Acute pain of right knee  Muscle weakness (generalized)  Other abnormalities of gait and mobility  Localized edema     Problem List Patient Active Problem List   Diagnosis Date Noted   Arthritis of right knee    S/P total knee replacement, 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

## 2021-05-04 ENCOUNTER — Other Ambulatory Visit: Payer: Self-pay

## 2021-05-04 ENCOUNTER — Ambulatory Visit: Payer: 59 | Admitting: Physical Therapy

## 2021-05-04 DIAGNOSIS — R600 Localized edema: Secondary | ICD-10-CM

## 2021-05-04 DIAGNOSIS — R2689 Other abnormalities of gait and mobility: Secondary | ICD-10-CM

## 2021-05-04 DIAGNOSIS — C9110 Chronic lymphocytic leukemia of B-cell type not having achieved remission: Secondary | ICD-10-CM | POA: Diagnosis not present

## 2021-05-04 DIAGNOSIS — M6281 Muscle weakness (generalized): Secondary | ICD-10-CM

## 2021-05-04 DIAGNOSIS — M25561 Pain in right knee: Secondary | ICD-10-CM

## 2021-05-04 NOTE — Therapy (Signed)
[place], [place], [address] Phone: [phone]   Fax:  [phone]  Physical Therapy Treatment  Patient Details  Name: Kevin Patterson MRN: [id number] Date of Birth: 14-Sep-1955 Referring Provider (PT): Marcene Duos, MD   Encounter Date: 05/04/2021   PT End of Session - 05/04/21 1223     Visit Number 3    Number of Visits 13    Date for PT Re-Evaluation 06/15/21    Authorization Type aetna--60 visits/year $40 copay    PT Start Time 1145    PT Stop Time 1224    PT Time Calculation (min) 39 min    Activity Tolerance Patient tolerated treatment well    Behavior During Therapy [facility] for tasks assessed/performed             Past Medical History:  Diagnosis Date   Arthritis    Cyst of kidney, acquired    history of , monitored   Hypertension     Past Surgical History:  Procedure Laterality Date   APPENDECTOMY     BACK SURGERY     JOINT REPLACEMENT     left knee   KNEE ARTHROTOMY  12/28/1968   open cleanout   TONSILLECTOMY     TOTAL KNEE ARTHROPLASTY Left 03/13/2015   Procedure: LEFT TOTAL KNEE ARTHROPLASTY;  Surgeon: Elsie Saas, MD;  Location: [place];  Service: Orthopedics;  Laterality: Left;   TOTAL KNEE ARTHROPLASTY Right 04/10/2021   Procedure: RIGHT TOTAL KNEE ARTHROPLASTY;  Surgeon: Meredith Pel, MD;  Location: [place];  Service: Orthopedics;  Laterality: Right;    There were no vitals filed for this visit.   Subjective Assessment - 05/04/21 1142     Subjective Pt states he can "feel it" today. He states "i am stiff"    Pertinent History arthritis, cyst of kidney, HTN    Patient Stated Goals I want to be walking and get back to work.    Currently in Pain? Yes    Pain Score 4     Pain Location Knee    Pain Orientation Right                [facility] PT Assessment - 05/04/21 0001       Assessment   Medical Diagnosis R TKR    Referring Provider (PT) Marcene Duos, MD    Onset Date/Surgical Date 04/10/21    Hand Dominance Right    Prior Therapy acute, HHPT                           [facility] Adult PT Treatment/Exercise - 05/04/21 0001       Ambulation/Gait   Gait Comments backward gait with RW to improve knee extension      Knee/Hip Exercises: Stretches   Passive Hamstring Stretch Right;1 rep;30 seconds   seated with back straight     Knee/Hip Exercises: Aerobic   Nustep L5: slow motion, 5 min for ROM.      Knee/Hip Exercises: Standing   Other Standing Knee Exercises wt shifts laterally and A/P x 1 min each with focus on decreasing UE support on RW    Other Standing Knee Exercises STS without UE support x 10 from elevated surface      Knee/Hip Exercises: Seated   Long Arc Quad 10 reps    Other Seated Knee/Hip Exercises seated scoot x 5 sec hold, 10 reps      Knee/Hip  Exercises: Supine   Quad Sets Strengthening;Right;5 reps;2 sets   5 sec hold; tactile cues.   Heel Slides 10 reps    Straight Leg Raises Right;2 sets;5 reps    Straight Leg Raises Limitations pt able to do AROM this visit      Manual Therapy   Manual Therapy Passive ROM    Passive ROM PROM with stretch at end range for Rt knee flexion and extension                          PT Long Term Goals - 05/01/21 1639       PT LONG TERM GOAL #1   Title The patient will be indep with HEP for R LE strengthening, general mobility, stretching.    Time 6    Period Weeks    Target Date 06/12/21      PT LONG TERM GOAL #2   Title The patient will improve R knee AROM to 105 degrees flexion.    Baseline 88 degrees    Time 6    Period Weeks    Target Date 06/12/21      PT LONG TERM GOAL #3   Title The patient will demonstrate LAQ with < 15 degrees extensor lag.    Baseline -38 degrees seated LAQ    Time 6    Period Weeks    Target Date 06/12/21      PT LONG TERM GOAL #4   Title The patient will improve gait to ambulate no device x 500 feet  mod indep.    Time 6    Period Weeks    Target Date 06/12/21      PT LONG TERM GOAL #5   Title The patient will imrpove gait speed to > or equal to 2.6 ft/sec for community ambulation gait speed.    Time 6    Period Weeks    Target Date 06/12/21      Additional Long Term Goals   Additional Long Term Goals Yes      PT LONG TERM GOAL #6   Title The patient will negotiate stairs in home to access both levels mod indep with one handrail.    Time 6    Period Weeks    Target Date 06/12/21      PT LONG TERM GOAL #7   Title Improve FOTO to 66%.    Time 6    Period Weeks    Target Date 06/12/21                   Plan - 05/04/21 1223     Clinical Impression Statement Pt responds well to manual stretching of knee with improved range after stretch. Pt with difficulty standing marching today due to Rt knee pain so performed wt shifts in standing with focus on decreasing UE pressure on RW with improved tolerance    PT Next Visit Plan progress HEP, progress gait working on dec'd reliance on RW and normalizing gait pattern, push quad control and extension for gait  (NO Vaso -not covered)    PT Home Exercise Plan [id number]    Consulted and Agree with Plan of Care Patient             Patient will benefit from skilled therapeutic intervention in order to improve the following deficits and impairments:     Visit Diagnosis: Acute pain of right knee  Muscle weakness (generalized)  Other abnormalities of gait  and mobility  Localized edema     Problem List Patient Active Problem List   Diagnosis Date Noted   Arthritis of right knee    S/P total knee replacement, right 04/10/2021   DJD (degenerative joint disease) of knee 03/13/2015    Emerald Gehres, PT 05/04/2021, 12:25 PM  [facility] [place], [place], [address] Phone: [phone]   Fax:  [phone]  Name: Kevin Patterson MRN:  [id number] Date of Birth: 06-Nov-1955

## 2021-05-07 ENCOUNTER — Ambulatory Visit: Payer: 59 | Admitting: Physical Therapy

## 2021-05-07 ENCOUNTER — Other Ambulatory Visit: Payer: Self-pay

## 2021-05-07 ENCOUNTER — Encounter: Payer: Self-pay | Admitting: Physical Therapy

## 2021-05-07 DIAGNOSIS — R600 Localized edema: Secondary | ICD-10-CM

## 2021-05-07 DIAGNOSIS — R2689 Other abnormalities of gait and mobility: Secondary | ICD-10-CM

## 2021-05-07 DIAGNOSIS — M6281 Muscle weakness (generalized): Secondary | ICD-10-CM

## 2021-05-07 DIAGNOSIS — M25561 Pain in right knee: Secondary | ICD-10-CM

## 2021-05-07 DIAGNOSIS — C9110 Chronic lymphocytic leukemia of B-cell type not having achieved remission: Secondary | ICD-10-CM | POA: Diagnosis not present

## 2021-05-07 NOTE — Therapy (Signed)
[place], [place], [address] Phone: [phone]   Fax:  [phone]  Physical Therapy Treatment  Patient Details  Name: Kevin Patterson MRN: [id number] Date of Birth: 11/21/1955 Referring Provider (PT): Marcene Duos, MD   Encounter Date: 05/07/2021   PT End of Session - 05/07/21 1026     Visit Number 4    Number of Visits 13    Date for PT Re-Evaluation 06/15/21    Authorization Type aetna--60 visits/year $40 copay    PT Start Time 1019    PT Stop Time 1058    PT Time Calculation (min) 39 min    Activity Tolerance Patient tolerated treatment well    Behavior During Therapy [facility] for tasks assessed/performed             Past Medical History:  Diagnosis Date   Arthritis    Cyst of kidney, acquired    history of , monitored   Hypertension     Past Surgical History:  Procedure Laterality Date   APPENDECTOMY     BACK SURGERY     JOINT REPLACEMENT     left knee   KNEE ARTHROTOMY  12/28/1968   open cleanout   TONSILLECTOMY     TOTAL KNEE ARTHROPLASTY Left 03/13/2015   Procedure: LEFT TOTAL KNEE ARTHROPLASTY;  Surgeon: Elsie Saas, MD;  Location: [place];  Service: Orthopedics;  Laterality: Left;   TOTAL KNEE ARTHROPLASTY Right 04/10/2021   Procedure: RIGHT TOTAL KNEE ARTHROPLASTY;  Surgeon: Meredith Pel, MD;  Location: [place];  Service: Orthopedics;  Laterality: Right;    There were no vitals filed for this visit.   Subjective Assessment - 05/07/21 1103     Subjective Pt reports continued stiffness in Rt knee.  No other changes.    Pertinent History arthritis, cyst of kidney, HTN    Patient Stated Goals I want to be walking and get back to work.    Currently in Pain? No/denies    Pain Score 0-No pain                [facility] PT Assessment - 05/07/21 0001       Assessment   Medical Diagnosis R TKR    Referring Provider (PT) Marcene Duos, MD    Onset Date/Surgical  Date 04/10/21    Hand Dominance Right    Prior Therapy acute, HHPT      PROM   Right Knee Extension -4    Right Knee Flexion 100               [facility] Adult PT Treatment/Exercise - 05/07/21 0001       Ambulation/Gait   Ambulation Distance (Feet) 80 Feet    Assistive device Straight cane;1 person hand held assist    Gait Pattern Step-through pattern;Decreased weight shift to right      Knee/Hip Exercises: Stretches   Hip Flexor Stretch Right;2 reps;30 seconds   thomas position   Gastroc Stretch Right;2 reps;20 seconds   standing; runner's stretch   Other Knee/Hip Stretches standing on LLE, Rt foot on 12" step lunging forward x 10 sec, and straightening Rt knee x 10 sec x 5 reps.  then      Knee/Hip Exercises: Aerobic   Recumbent Bike partial revolutions x 7 min for ROM    Other Aerobic single laps around gym in between exercises to decrease stiffness in Rt knee/hip.      Knee/Hip Exercises: Standing   Heel Raises  Both;1 set;10 reps    Forward Step Up Right;1 set;10 reps;Step Height: 4"   BUE on rails   Other Standing Knee Exercises side stepping 5 ft Rt/L 3 reps with light UE support.   Rt foot taps to 6" step with UE on rails x 10.      Knee/Hip Exercises: Supine   Quad Sets Strengthening;Both;1 set;10 reps   5 sec hold; long sitting.   Straight Leg Raises Right;1 set;5 reps      Manual Therapy   Manual Therapy Taping    Kinesiotex Edema      Kinesiotix   Edema i strips of sensitive skin Rock tape applied in xpattern with 50% stretch to medial Rt knee at joint like to decompress tissue and increase proprioception.                     PT Education - 05/07/21 1100     Education Details safe ktape removal    Person(s) Educated Patient    Methods Explanation    Comprehension Verbalized understanding                 PT Long Term Goals - 05/01/21 1639       PT LONG TERM GOAL #1   Title The patient will be indep with HEP for R LE strengthening,  general mobility, stretching.    Time 6    Period Weeks    Target Date 06/12/21      PT LONG TERM GOAL #2   Title The patient will improve R knee AROM to 105 degrees flexion.    Baseline 88 degrees    Time 6    Period Weeks    Target Date 06/12/21      PT LONG TERM GOAL #3   Title The patient will demonstrate LAQ with < 15 degrees extensor lag.    Baseline -38 degrees seated LAQ    Time 6    Period Weeks    Target Date 06/12/21      PT LONG TERM GOAL #4   Title The patient will improve gait to ambulate no device x 500 feet mod indep.    Time 6    Period Weeks    Target Date 06/12/21      PT LONG TERM GOAL #5   Title The patient will imrpove gait speed to > or equal to 2.6 ft/sec for community ambulation gait speed.    Time 6    Period Weeks    Target Date 06/12/21      Additional Long Term Goals   Additional Long Term Goals Yes      PT LONG TERM GOAL #6   Title The patient will negotiate stairs in home to access both levels mod indep with one handrail.    Time 6    Period Weeks    Target Date 06/12/21      PT LONG TERM GOAL #7   Title Improve FOTO to 66%.    Time 6    Period Weeks    Target Date 06/12/21                   Plan - 05/07/21 1053     Clinical Impression Statement Improved Rt knee ROM and quad contraction since last visit.  He continues to exibit extensor lag with SLR.  Some buckling with RLE ascending 4" step.  Progressing well towards LTGs.    Rehab Potential Good    PT  Frequency 2x / week    PT Duration 6 weeks    PT Treatment/Interventions ADLs/Self Care Home Management;Patient/family education;Gait training;Stair training;Functional mobility training;Therapeutic activities;Aquatic Therapy;Cryotherapy;Vasopneumatic Device;Therapeutic exercise;Balance training;Neuromuscular re-education;Passive range of motion;Scar mobilization;Moist Heat;Electrical Stimulation;Taping;Manual techniques;Dry needling    PT Next Visit Plan progress HEP,  progress gait working on dec'd reliance on RW and normalizing gait pattern, push quad control and extension for gait  (NO Vaso -not covered)    PT Home Exercise Plan [id number]    Consulted and Agree with Plan of Care Patient             Patient will benefit from skilled therapeutic intervention in order to improve the following deficits and impairments:  Abnormal gait, Decreased range of motion, Increased fascial restricitons, Impaired tone, Pain, Decreased balance, Decreased scar mobility, Hypomobility, Impaired flexibility, Decreased strength, Increased edema  Visit Diagnosis: Acute pain of right knee  Muscle weakness (generalized)  Other abnormalities of gait and mobility  Localized edema     Problem List Patient Active Problem List   Diagnosis Date Noted   Arthritis of right knee    S/P total knee replacement, right 04/10/2021   DJD (degenerative joint disease) of knee 03/13/2015   Kerin Perna, PTA 05/07/21 11:04 AM  [place], [place], [address] Phone: [phone]   Fax:  [phone]  Name: Kevin Patterson MRN: [id number] Date of Birth: [birth year]

## 2021-05-09 ENCOUNTER — Inpatient Hospital Stay: Payer: 59

## 2021-05-09 ENCOUNTER — Inpatient Hospital Stay: Payer: 59 | Attending: Hematology | Admitting: Hematology

## 2021-05-09 ENCOUNTER — Ambulatory Visit: Payer: 59 | Admitting: Physical Therapy

## 2021-05-09 ENCOUNTER — Other Ambulatory Visit: Payer: Self-pay

## 2021-05-09 VITALS — BP 137/75 | HR 73 | Temp 98.2°F | Resp 17 | Wt 220.6 lb

## 2021-05-09 DIAGNOSIS — I10 Essential (primary) hypertension: Secondary | ICD-10-CM | POA: Insufficient documentation

## 2021-05-09 DIAGNOSIS — R2689 Other abnormalities of gait and mobility: Secondary | ICD-10-CM

## 2021-05-09 DIAGNOSIS — C9110 Chronic lymphocytic leukemia of B-cell type not having achieved remission: Secondary | ICD-10-CM

## 2021-05-09 DIAGNOSIS — M1990 Unspecified osteoarthritis, unspecified site: Secondary | ICD-10-CM | POA: Insufficient documentation

## 2021-05-09 DIAGNOSIS — Z87891 Personal history of nicotine dependence: Secondary | ICD-10-CM | POA: Insufficient documentation

## 2021-05-09 DIAGNOSIS — M6281 Muscle weakness (generalized): Secondary | ICD-10-CM

## 2021-05-09 DIAGNOSIS — M25561 Pain in right knee: Secondary | ICD-10-CM

## 2021-05-09 DIAGNOSIS — D649 Anemia, unspecified: Secondary | ICD-10-CM | POA: Insufficient documentation

## 2021-05-09 DIAGNOSIS — Z7289 Other problems related to lifestyle: Secondary | ICD-10-CM | POA: Insufficient documentation

## 2021-05-09 DIAGNOSIS — Z79899 Other long term (current) drug therapy: Secondary | ICD-10-CM | POA: Insufficient documentation

## 2021-05-09 DIAGNOSIS — N281 Cyst of kidney, acquired: Secondary | ICD-10-CM | POA: Insufficient documentation

## 2021-05-09 LAB — CMP (CANCER CENTER ONLY)
ALT: 10 U/L (ref 0–44)
AST: 17 U/L (ref 15–41)
Albumin: 4.3 g/dL (ref 3.5–5.0)
Alkaline Phosphatase: 104 U/L (ref 38–126)
Anion gap: 9 (ref 5–15)
BUN: 13 mg/dL (ref 8–23)
CO2: 27 mmol/L (ref 22–32)
Calcium: 9.6 mg/dL (ref 8.9–10.3)
Chloride: 102 mmol/L (ref 98–111)
Creatinine: 0.98 mg/dL (ref 0.61–1.24)
GFR, Estimated: >60 mL/min (ref >60)
Glucose, Bld: 90 mg/dL (ref 70–99)
Potassium: 4.1 mmol/L (ref 3.5–5.1)
Sodium: 138 mmol/L (ref 135–145)
Total Bilirubin: 0.8 mg/dL (ref 0.3–1.2)
Total Protein: 7.6 g/dL (ref 6.5–8.1)

## 2021-05-09 LAB — CBC WITH DIFFERENTIAL/PLATELET
Abs Immature Granulocytes: 0 10*3/uL (ref 0.00–0.07)
Basophils Absolute: 0 10*3/uL (ref 0.0–0.1)
Basophils Relative: 0 %
Eosinophils Absolute: 0.4 10*3/uL (ref 0.0–0.5)
Eosinophils Relative: 2 %
HCT: 36.9 % — ABNORMAL LOW (ref 39.0–52.0)
Hemoglobin: 11.1 g/dL — ABNORMAL LOW (ref 13.0–17.0)
Lymphocytes Relative: 48 %
Lymphs Abs: 8.8 10*3/uL — ABNORMAL HIGH (ref 0.7–4.0)
MCH: 27.8 pg (ref 26.0–34.0)
MCHC: 30.1 g/dL (ref 30.0–36.0)
MCV: 92.3 fL (ref 80.0–100.0)
Monocytes Absolute: 1.8 10*3/uL — ABNORMAL HIGH (ref 0.1–1.0)
Monocytes Relative: 10 %
Neutro Abs: 7.3 10*3/uL (ref 1.7–7.7)
Neutrophils Relative %: 40 %
Platelets: 277 10*3/uL (ref 150–400)
RBC: 4 MIL/uL — ABNORMAL LOW (ref 4.22–5.81)
RDW: 14.2 % (ref 11.5–15.5)
Smear Review: NORMAL
WBC: 18.3 10*3/uL — ABNORMAL HIGH (ref 4.0–10.5)
nRBC: 0 % (ref 0.0–0.2)

## 2021-05-09 LAB — RETICULOCYTES
Immature Retic Fract: 29.7 % — ABNORMAL HIGH (ref 2.3–15.9)
RBC.: 3.99 MIL/uL — ABNORMAL LOW (ref 4.22–5.81)
Retic Count, Absolute: 115.3 10*3/uL (ref 19.0–186.0)
Retic Ct Pct: 2.9 % (ref 0.4–3.1)

## 2021-05-09 LAB — LACTATE DEHYDROGENASE: LDH: 133 U/L (ref 98–192)

## 2021-05-09 LAB — DIRECT ANTIGLOBULIN TEST (NOT AT ARMC)
DAT, IgG: NEGATIVE
DAT, complement: NEGATIVE

## 2021-05-09 LAB — HIV ANTIBODY (ROUTINE TESTING W REFLEX): HIV Screen 4th Generation wRfx: NONREACTIVE

## 2021-05-09 LAB — HEPATITIS B SURFACE ANTIGEN: Hepatitis B Surface Ag: NONREACTIVE

## 2021-05-09 LAB — FERRITIN: Ferritin: 193 ng/mL (ref 24–336)

## 2021-05-09 LAB — VITAMIN B12: Vitamin B-12: 479 pg/mL (ref 180–914)

## 2021-05-09 LAB — HEPATITIS C ANTIBODY: HCV Ab: NONREACTIVE

## 2021-05-09 LAB — HEPATITIS B CORE ANTIBODY, TOTAL: Hep B Core Total Ab: NONREACTIVE

## 2021-05-09 NOTE — Patient Instructions (Signed)
Thank you for choosing [facility] to provide your care.   Should you have questions after your visit to the [facility] ([facility]), please contact this office at [phone] between 8:30 AM and 4:30 PM.  Voice mails left after 4:00 PM may not be returned until the following business day.  Calls received after 4:30 PM will be answered by an off-site Nurse Triage Line.    Prescription Refills:  Please have your pharmacy contact us directly for most prescription requests.  Contact the office directly for refills of narcotics (pain medications). Allow 48-72 hours for refills.  Appointments: Please contact the [facility] scheduling department [phone] for questions regarding [facility] appointment scheduling.  Contact the schedulers with any scheduling changes so that your appointment can be rescheduled in a timely manner.   Central Scheduling for [place] [phone] - Call to schedule procedures such as PET scans, CT scans, MRI, Ultrasound, etc.  To afford each patient quality time with our providers, please arrive 30 minutes before your scheduled appointment time.  If you arrive late for your appointment, you may be asked to reschedule.  We strive to give you quality time with our providers, and arriving late affects you and other patients whose appointments are after yours. If you are a no show for multiple scheduled visits, you may be dismissed from the clinic at the providers discretion.     Resources: [facility] Social Workers [phone] for additional information on assistance programs or assistance connecting with community support programs   [place] DSS  336-641-3447: Information regarding food stamps, Medicaid, and utility assistance [place] Access [place] 336-333-6589   [place] [employer]'s shared-ride transportation service for eligible riders who have a disability that prevents them from riding the fixed route bus.   Medicare Rights Center 800-333-4114  Helps people with Medicare understand their rights and benefits, navigate the Medicare system, and secure the quality healthcare they deserve [employer] 800-227-2345 Assists patients locate various types of support and financial assistance Cancer Care: 1-800-813-HOPE (4673) Provides financial assistance, online support groups, medication/co-pay assistance.   Transportation Assistance for appointments at [facility]: Transportation Coordinator [phone]  Again, thank you for choosing  [facility]cer Center for your care.       

## 2021-05-09 NOTE — Addendum Note (Signed)
Addended by: Rudell Cobb M on: 05/09/2021 03:10 PM   Modules accepted: Orders

## 2021-05-09 NOTE — Therapy (Signed)
[place], [place], [address] Phone: [phone]   Fax:  [phone]  Physical Therapy Treatment  Patient Details  Name: Kevin Patterson MRN: [id number] Date of Birth: 1955-07-15 Referring Provider (PT): Marcene Duos, MD   Encounter Date: 05/09/2021   PT End of Session - 05/09/21 1012     Visit Number 5    Number of Visits 13    Date for PT Re-Evaluation 06/15/21    Authorization Type aetna--60 visits/year $40 copay    PT Start Time 1015    PT Stop Time 1100    PT Time Calculation (min) 45 min    Activity Tolerance Patient tolerated treatment well    Behavior During Therapy [facility] for tasks assessed/performed             Past Medical History:  Diagnosis Date   Arthritis    Cyst of kidney, acquired    history of , monitored   Hypertension     Past Surgical History:  Procedure Laterality Date   APPENDECTOMY     BACK SURGERY     JOINT REPLACEMENT     left knee   KNEE ARTHROTOMY  12/28/1968   open cleanout   TONSILLECTOMY     TOTAL KNEE ARTHROPLASTY Left 03/13/2015   Procedure: LEFT TOTAL KNEE ARTHROPLASTY;  Surgeon: Elsie Saas, MD;  Location: [facility];  Service: Orthopedics;  Laterality: Left;   TOTAL KNEE ARTHROPLASTY Right 04/10/2021   Procedure: RIGHT TOTAL KNEE ARTHROPLASTY;  Surgeon: Meredith Pel, MD;  Location: [facility];  Service: Orthopedics;  Laterality: Right;    There were no vitals filed for this visit.   Subjective Assessment - 05/09/21 1022     Subjective Pt arrives ambulating with SPC.  He states he sometimes furniture walks while in house, and occasionally uses RW.  Biggest complaint is stiffness.    Pertinent History arthritis, cyst of kidney, HTN    Patient Stated Goals I want to be walking and get back to work.    Currently in Pain? No/denies    Pain Score 0-No pain                [facility] PT Assessment - 05/09/21 0001       Assessment   Medical  Diagnosis R TKR    Referring Provider (PT) Marcene Duos, MD    Onset Date/Surgical Date 04/10/21    Hand Dominance Right    Next MD Visit 05/14/21    Prior Therapy acute, HH[facility]      AROM   Right Knee Extension -25   LAQ   Right Knee Flexion 96   seated scoot     Ambulation/Gait   Gait velocity 1.76 ft/sec with SPC              [facility] Adult PT Treatment/Exercise - 05/09/21 0001       Ambulation/Gait   Ambulation Distance (Feet) 240 Feet    Assistive device Straight cane    Gait Pattern Step-through pattern;Decreased stance time - right;Decreased hip/knee flexion - right;Decreased dorsiflexion - right    Ambulation Surface Level;Indoor      Knee/Hip Exercises: Stretches   Pension scheme manager reps;30 seconds   prone with strap, pillow under pelvis (for low back) and knee   Hip Flexor Stretch Right;2 reps;30 seconds   thomas position   Gastroc Stretch Right;2 reps;20 seconds   standing; runner's stretch     Knee/Hip Exercises: Aerobic   Recumbent Bike partial  revolutions x 7 min for ROM    Other Aerobic single laps around gym in between exercises to decrease stiffness in Rt knee/hip.      Knee/Hip Exercises: Standing   Heel Raises Both;10 reps;2 sets    Hip Abduction Stengthening;Right;Left;1 set;10 reps    Hip Extension Stengthening;1 set;15 reps;Right;Left    Lateral Step Up Right;1 set;10 reps;Step Height: 4"    Forward Step Up Right;2 sets;5 reps;Step Height: 6";10 reps;Step Height: 4"    Step Down Left;1 set;10 reps;Step Height: 4"    Other Standing Knee Exercises side stepping 12 ft Rt/Lt with light UE support on counter; cues to increase step height.    Other Standing Knee Exercises Foot taps to 6" step x 10 reps with UE support on bilat rails (improved foot clearance).      Knee/Hip Exercises: Seated   Long Arc Quad Right;1 set;10 reps    Heel Slides Limitations seated scoot x 10 reps    Sit to Sand 15 reps;without UE support   core engaged.                          PT Long Term Goals - 05/09/21 1048       PT LONG TERM GOAL #1   Title The patient will be indep with HEP for R LE strengthening, general mobility, stretching.    Time 6    Period Weeks    Status On-going    Target Date 06/12/21      PT LONG TERM GOAL #2   Title The patient will improve R knee AROM to 105 degrees flexion.    Baseline 100: 05/09/21    Time 6    Period Weeks    Status On-going    Target Date 06/12/21      PT LONG TERM GOAL #3   Title The patient will demonstrate LAQ with < 15 degrees extensor lag.    Baseline -22 degrees seated LAQ:05/09/21    Time 6    Period Weeks    Status On-going    Target Date 06/12/21      PT LONG TERM GOAL #4   Title The patient will improve gait to ambulate no device x 500 feet mod indep.    Baseline ambulating with SPC    Time 6    Period Weeks    Status On-going    Target Date 06/12/21      PT LONG TERM GOAL #5   Title The patient will improve gait speed to > or equal to 2.6 ft/sec for community ambulation gait speed.    Baseline 1.76 ft/sec:  05/09/21    Time 6    Period Weeks    Status On-going    Target Date 06/12/21      PT LONG TERM GOAL #6   Title The patient will negotiate stairs in home to access both levels mod indep with one handrail.    Time 6    Period Weeks    Status On-going    Target Date 06/12/21      PT LONG TERM GOAL #7   Title Improve FOTO to 66%.    Time 6    Period Weeks    Status On-going    Target Date 06/12/21                   Plan - 05/09/21 1235     Clinical Impression Statement Rt knee ROM gradually improving.  He was able to tolerate ascending 6" step today without any issues.  Posture is more upright than last week, however requires minor cues during session. Pt now ambulating with SPC and with improved gait speed. Pt is progressing well towards LTGs and will benefit from continued PT intervention to maximize functional mobility and safety.     Rehab Potential Good    PT Frequency 2x / week    PT Duration 6 weeks    PT Treatment/Interventions ADLs/Self Care Home Management;Patient/family education;Gait training;Stair training;Functional mobility training;Therapeutic activities;Aquatic Therapy;Cryotherapy;Vasopneumatic Device;Therapeutic exercise;Balance training;Neuromuscular re-education;Passive range of motion;Scar mobilization;Moist Heat;Electrical Stimulation;Taping;Manual techniques;Dry needling    PT Next Visit Plan Continue progressive ROM and strengthening for Rt knee/hip  (NO Vaso -not covered)    PT Home Exercise Plan [id number]    Consulted and Agree with Plan of Care Patient             Patient will benefit from skilled therapeutic intervention in order to improve the following deficits and impairments:  Abnormal gait, Decreased range of motion, Increased fascial restricitons, Impaired tone, Pain, Decreased balance, Decreased scar mobility, Hypomobility, Impaired flexibility, Decreased strength, Increased edema  Visit Diagnosis: Acute pain of right knee  Muscle weakness (generalized)  Other abnormalities of gait and mobility     Problem List Patient Active Problem List   Diagnosis Date Noted   Arthritis of right knee    S/P total knee replacement, 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

## 2021-05-09 NOTE — Progress Notes (Addendum)
[name]   HEMATOLOGY/ONCOLOGY CONSULTATION NOTE  Date of Service: 05/09/2021  Patient Care Team: Drosinis, Pamalee Leyden, PA-C as PCP - General (Internal Medicine)  CHIEF COMPLAINTS/PURPOSE OF CONSULTATION:  Evaluation and management of newly diagnosed CD5 positive lymphoproliferative disorder  HISTORY OF PRESENTING ILLNESS:   Kevin Patterson is a wonderful 66 y.o. male who has been referred to [place] by Dr Marcene Duos MD for evaluation and management of incidentally noted lymphocytosis.  Patient has a history of hypertension, arthritis, renal cyst who was seen on 03/07/2021 by Dr. Marcene Duos for evaluation of his severe right knee arthritis and consideration of right knee TKA.  Patient was scheduled for a right total knee arthroplasty on 04/10/2021 and preoperative labs on 04/06/2021 showed WBC count of 16.7k with 8k lymphocytes, 4.1k monocytes, normal hemoglobin of 13.8 with an MCV of 91 and normal platelets of 201k Patient subsequently had a peripheral blood flow cytometry on 04/10/2021 which showed a monoclonal B-cell population with coexpression of CD5 comprising 88% of all lymphocytes.  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  CLL versus mantle cell lymphoma.  Patient has completed his right knee TKA and is working with physical therapy and progressing well.  He notes he has not had any recent change in clinical status over the last 3 to 6 months. No unexplained fevers/chills/drenching night sweats. No unexplained weight loss. No new lumps or bumps in his neck under the armpits or in the groin. No shortness of breath, chest pain, abdominal pain or distention.  No other acute new focal symptoms. No other symptoms of infection. No previous history of blood transfusions.   MEDICAL HISTORY:  Past Medical History:  Diagnosis Date   Arthritis    Cyst of kidney, acquired    history of , monitored   Hypertension     SURGICAL HISTORY: Past  Surgical History:  Procedure Laterality Date   APPENDECTOMY     BACK SURGERY     JOINT REPLACEMENT     left knee   KNEE ARTHROTOMY  12/28/1968   open cleanout   TONSILLECTOMY     TOTAL KNEE ARTHROPLASTY Left 03/13/2015   Procedure: LEFT TOTAL KNEE ARTHROPLASTY;  Surgeon: Elsie Saas, MD;  Location: [facility];  Service: Orthopedics;  Laterality: Left;   TOTAL KNEE ARTHROPLASTY Right 04/10/2021   Procedure: RIGHT TOTAL KNEE ARTHROPLASTY;  Surgeon: Meredith Pel, MD;  Location: [facility];  Service: Orthopedics;  Laterality: Right;    SOCIAL HISTORY: Social History   Socioeconomic History   Marital status: Married    Spouse name: Not on file   Number of children: Not on file   Years of education: Not on file   Highest education level: Not on file  Occupational History   Not on file  Tobacco Use   Smoking status: Former    Types: Cigarettes    Quit date: 04/29/1973    Years since quitting: 48.0   Smokeless tobacco: Not on file  Substance and Sexual Activity   Alcohol use: Yes    Alcohol/week: 3.0 standard drinks    Types: 3 Glasses of wine per week   Drug use: Not on file   Sexual activity: Not on file  Other Topics Concern   Not on file  Social History Narrative   Not on file   Social Determinants of Health   Financial Resource Strain: Not on file  Food Insecurity: Not on file  Transportation Needs: Not on file  Physical Activity: Not on file  Stress: Not on file  Social Connections: Not on file  Intimate Partner Violence: Not on file    FAMILY HISTORY: No reported family history of known blood disorders or cancers.  ALLERGIES:  has No Known Allergies.  MEDICATIONS:  Current Outpatient Medications  Medication Sig Dispense Refill   Ascorbic Acid (VITAMIN C) 1000 MG tablet Take 1,000 mg by mouth daily.     Ashwagandha 500 MG CAPS Take 500 mg by mouth daily.     aspirin 81 MG chewable tablet Chew 1 tablet (81 mg total) by mouth 2 (two) times daily. 60 tablet 0    Calcium Citrate-Vitamin D (CALCIUM + D PO) Take 1 tablet by mouth daily.     cholecalciferol (VITAMIN D3) 25 MCG (1000 UNIT) tablet Take 1,000 Units by mouth daily.     Flaxseed, Linseed, (FLAXSEED OIL) 1000 MG CAPS Take 2,000 mg by mouth daily.     L-Arginine 500 MG CAPS Take 500 mg by mouth daily.     methocarbamol (ROBAXIN) 500 MG tablet Take 1 tablet (500 mg total) by mouth every 8 (eight) hours as needed for muscle spasms. 30 tablet 0   milk thistle 175 MG tablet Take 175 mg by mouth daily.     Multiple Vitamins-Minerals (MULTIVITAMIN WITH MINERALS) tablet Take 1 tablet by mouth daily.     olmesartan-hydrochlorothiazide (BENICAR HCT) 20-12.5 MG tablet Take 1 tablet by mouth daily.     OVER THE COUNTER MEDICATION Apply 1 application topically 2 (two) times daily as needed (pain relief). CBD Oil with Emu Oil 500 mg     oxyCODONE (OXY IR/ROXICODONE) 5 MG immediate release tablet Take 1-2 tablets (5-10 mg total) by mouth every 4 (four) hours as needed for moderate pain (pain score 4-6). 30 tablet 0   oxyCODONE (OXY IR/ROXICODONE) 5 MG immediate release tablet 1 po q 4 hrs prn pain 45 tablet 0   tadalafil (CIALIS) 5 MG tablet Take 5 mg by mouth daily as needed for erectile dysfunction.     Vitamin A 2400 MCG (8000 UT) CAPS Take 8,000 Units by mouth daily.     zinc gluconate 50 MG tablet Take 50 mg by mouth daily.     No current facility-administered medications for this visit.    REVIEW OF SYSTEMS:    10 Point review of Systems was done is negative except as noted above.  PHYSICAL EXAMINATION: ECOG PERFORMANCE STATUS:   . Vitals:   05/09/21 1328  BP: 137/75  Pulse: 73  Resp: 17  Temp: 98.2 F (36.8 C)  SpO2: 100%   Filed Weights   05/09/21 1328  Weight: 220 lb 9.6 oz (100.1 kg)   .Body mass index is 28.32 kg/m.  GENERAL:alert, in no acute distress and comfortable SKIN: no acute rashes, no significant lesions EYES: conjunctiva are pink and non-injected, sclera  anicteric OROPHARYNX: MMM, no exudates, no oropharyngeal erythema or ulceration NECK: supple, no JVD LYMPH:  no palpable lymphadenopathy in the cervical, axillary or inguinal regions LUNGS: clear to auscultation b/l with normal respiratory effort HEART: regular rate & rhythm ABDOMEN:  normoactive bowel sounds , non tender, not distended. Extremity: no pedal edema PSYCH: alert & oriented x 3 with fluent speech NEURO: no focal motor/sensory deficits  LABORATORY DATA:  I have reviewed the data as listed  . CBC Latest Ref Rng & Units 05/09/2021 04/11/2021 04/10/2021  WBC 4.0 - 10.5 K/uL 18.3(H) 22.4(H) 16.7(H)  Hemoglobin 13.0 - 17.0 g/dL 11.1(L) 11.0(L) 12.9(L)  Hematocrit 39.0 - 52.0 %  36.9(L) 35.2(L) 40.8  Platelets 150 - 400 K/uL 277 182 180   .CBC    Component Value Date/Time   WBC 18.3 (H) 05/09/2021 1414   RBC 3.99 (L) 05/09/2021 1415   RBC 4.00 (L) 05/09/2021 1414   HGB 11.1 (L) 05/09/2021 1414   HCT 36.9 (L) 05/09/2021 1414   PLT 277 05/09/2021 1414   MCV 92.3 05/09/2021 1414   MCH 27.8 05/09/2021 1414   MCHC 30.1 05/09/2021 1414   RDW 14.2 05/09/2021 1414   LYMPHSABS 8.8 (H) 05/09/2021 1414   MONOABS 1.8 (H) 05/09/2021 1414   EOSABS 0.4 05/09/2021 1414   BASOSABS 0.0 05/09/2021 1414     . CMP Latest Ref Rng & Units 05/09/2021 04/11/2021 04/10/2021  Glucose 70 - 99 mg/dL 90 123(H) 102(H)  BUN 8 - 23 mg/dL 13 11 13   Creatinine 0.61 - 1.24 mg/dL 0.98 1.14 1.00  Sodium 135 - 145 mmol/L 138 133(L) 135  Potassium 3.5 - 5.1 mmol/L 4.1 4.3 4.4  Chloride 98 - 111 mmol/L 102 101 106  CO2 22 - 32 mmol/L 27 27 23   Calcium 8.9 - 10.3 mg/dL 9.6 8.8(L) 9.2  Total Protein 6.5 - 8.1 g/dL 7.6 - -  Total Bilirubin 0.3 - 1.2 mg/dL 0.8 - -  Alkaline Phos 38 - 126 U/L 104 - -  AST 15 - 41 U/L 17 - -  ALT 0 - 44 U/L 10 - -     RADIOGRAPHIC STUDIES: I have personally reviewed the radiological images as listed and agreed with the findings in the report. XR Knee 1-2 Views  Right  Result Date: 04/23/2021 AP lateral radiographs right knee reviewed.  Total knee prosthesis in good position alignment with no complicating features.  No acute fracture.  Patella height normal relative to distal femur.   ASSESSMENT & PLAN:   66 year old very pleasant gentleman with  1) Newly diagnosed CD5 positive lymphoproliferative disorder. Incidentally noted on labs in the context of preoperative labs prior to his recent right TKA. PLAN -His available lab results were discussed in details with him. -Discussed that his lymphocytosis along with peripheral blood flow cytometric findings showing monoclonal CD5 positive fight population making of 88% of his lymphocytes and the morphology of the cells is consistent with a CD5 positive non-Hodgkin's lymphoma/leukemia. -We discussed that the differential diagnosis would be likely CLL versus mantle cell lymphoma. -we discussed the likely in diagnosis in detail, recommended work-up and likely treatment considerations. -Will get CLL FISH panel  - FISH for t(11;14) to rule out mantle cell lymphoma. -Baseline labs ordered as noted below -PET CT scan for initial staging and evaluation of newly diagnosed Hodgkin's lymphoma.  Follow-up Labs today PET/CT in 1 week RTC with Dr Irene Limbo in 2 weeks  Orders Placed This Encounter  Procedures   NM PET Image Initial (PI) Skull Base To Thigh    Standing Status:   Future    Number of Occurrences:   1    Standing Expiration Date:   05/09/2022    Order Specific Question:   If indicated for the ordered procedure, I authorize the administration of a radiopharmaceutical per Radiology protocol    Answer:   Yes    Order Specific Question:   Preferred imaging location?    Answer:   [facility]   CBC with Differential/Platelet    Standing Status:   Future    Number of Occurrences:   1    Standing Expiration Date:   05/09/2022   CMP (Cancer  Center only)    Standing Status:   Future    Number of  Occurrences:   1    Standing Expiration Date:   05/09/2022   Lactate dehydrogenase    Standing Status:   Future    Number of Occurrences:   1    Standing Expiration Date:   05/09/2022   FISH, CLL Prognostic Panel    Standing Status:   Future    Number of Occurrences:   1    Standing Expiration Date:   05/09/2022   Hepatitis B surface antigen    Standing Status:   Future    Number of Occurrences:   1    Standing Expiration Date:   05/09/2022   Hepatitis B core antibody, total    Standing Status:   Future    Number of Occurrences:   1    Standing Expiration Date:   05/09/2022   Hepatitis C antibody    Standing Status:   Future    Number of Occurrences:   1    Standing Expiration Date:   05/09/2022   HIV Antibody (routine testing w rflx)    Standing Status:   Future    Number of Occurrences:   1    Standing Expiration Date:   05/09/2022   Reticulocytes    Standing Status:   Future    Number of Occurrences:   1    Standing Expiration Date:   05/09/2022   Haptoglobin    Standing Status:   Future    Number of Occurrences:   1    Standing Expiration Date:   05/09/2022   Vitamin B12    Standing Status:   Future    Number of Occurrences:   1    Standing Expiration Date:   05/09/2022   Ferritin    Standing Status:   Future    Number of Occurrences:   1    Standing Expiration Date:   05/09/2022   Miscellaneous test (send-out)   Direct antiglobulin test (not at [facility])    Standing Status:   Future    Number of Occurrences:   1    Standing Expiration Date:   05/09/2022     All of the patients questions were answered with apparent satisfaction. The patient knows to call the clinic with any problems, questions or concerns.  I spent  45 mins counseling the patient face to face. The total time spent in the appointment was 61 mins including but not limited to extensive chart review, review of outside labs, detailed discussion of his new diagnosis of non-Hodgkin's lymphoma, discussed natural  history diagnostic possibilities work-up, addressing patient's multiple questions ordering additional work-up and documentation.   Sullivan Lone MD [place] AAHIVMS [facility] Hematology/Oncology Physician [facility]  (Office):       [phone] (Work cell):  [phone] (Fax):           [phone]  05/09/2021 1:14 PM

## 2021-05-09 NOTE — Progress Notes (Signed)
PET scan PA requested if required. PET scan scheduled for 05/17/21 at 7am/pt to arrive at 6:30 am. Pt contacted and informed. Pt acknowledged and verbalized understanding of NPO status.

## 2021-05-10 LAB — HAPTOGLOBIN: Haptoglobin: 193 mg/dL (ref 32–363)

## 2021-05-11 ENCOUNTER — Other Ambulatory Visit: Payer: Self-pay

## 2021-05-11 ENCOUNTER — Ambulatory Visit: Payer: 59 | Admitting: Physical Therapy

## 2021-05-11 DIAGNOSIS — R600 Localized edema: Secondary | ICD-10-CM

## 2021-05-11 DIAGNOSIS — R2689 Other abnormalities of gait and mobility: Secondary | ICD-10-CM

## 2021-05-11 DIAGNOSIS — C9110 Chronic lymphocytic leukemia of B-cell type not having achieved remission: Secondary | ICD-10-CM | POA: Diagnosis not present

## 2021-05-11 DIAGNOSIS — M6281 Muscle weakness (generalized): Secondary | ICD-10-CM

## 2021-05-11 DIAGNOSIS — M25561 Pain in right knee: Secondary | ICD-10-CM

## 2021-05-11 NOTE — Therapy (Signed)
[place], [place], [address] Phone: [phone]   Fax:  [phone]  Physical Therapy Treatment  Patient Details  Name: Burt Piatek MRN: [id number] Date of Birth: [birth year] Referring Provider (PT): Marcene Duos, MD   Encounter Date: 05/11/2021   PT End of Session - 05/11/21 1054     Visit Number 6    Number of Visits 13    Date for PT Re-Evaluation 06/15/21    Authorization Type aetna--60 visits/year $40 copay    PT Start Time 1016    PT Stop Time 1055    PT Time Calculation (min) 39 min    Activity Tolerance Patient tolerated treatment well    Behavior During Therapy [facility] for tasks assessed/performed             Past Medical History:  Diagnosis Date   Arthritis    Cyst of kidney, acquired    history of , monitored   Hypertension     Past Surgical History:  Procedure Laterality Date   APPENDECTOMY     BACK SURGERY     JOINT REPLACEMENT     left knee   KNEE ARTHROTOMY  12/28/1968   open cleanout   TONSILLECTOMY     TOTAL KNEE ARTHROPLASTY Left 03/13/2015   Procedure: LEFT TOTAL KNEE ARTHROPLASTY;  Surgeon: Elsie Saas, MD;  Location: [place];  Service: Orthopedics;  Laterality: Left;   TOTAL KNEE ARTHROPLASTY Right 04/10/2021   Procedure: RIGHT TOTAL KNEE ARTHROPLASTY;  Surgeon: Meredith Pel, MD;  Location: [place];  Service: Orthopedics;  Laterality: Right;    There were no vitals filed for this visit.   Subjective Assessment - 05/11/21 1025     Subjective Pt reports that elevating leg has made a big difference in swelling and stiffness.    Currently in Pain? Yes    Pain Score 2     Pain Location Knee    Pain Orientation Right    Pain Descriptors / Indicators Dull;Tightness    Aggravating Factors  sitting with LE dangling, sleeping on Lt side.    Pain Relieving Factors ice, elevation                [facility] PT Assessment - 05/11/21 0001        Assessment   Medical Diagnosis R TKR    Referring Provider (PT) Marcene Duos, MD    Onset Date/Surgical Date 04/10/21    Hand Dominance Right    Next MD Visit 05/14/21    Prior Therapy acute, HHPT               [facility] Adult PT Treatment/Exercise - 05/11/21 0001       Knee/Hip Exercises: Aerobic   Recumbent Bike partial revolutions x 6 min for ROM   Other Aerobic single laps around gym in between exercises to decrease stiffness in Rt knee/hip. Cues for even step length     Knee/Hip Exercises: Standing   Hip Abduction Stengthening;Right;Left;2 sets;10 reps;Knee straight    Hip Extension Stengthening;Right;Left;10 reps;2 sets    Lateral Step Up Right;1 set;10 reps;Step Height: 4"    Forward Step Up Right;1 set;10 reps;Step Height: 6"   Rt rail, Lt SPC   Step Down Limitations 2 reps of stepping down 6" step - limited tolerance due to limited ROM.    Other Standing Knee Exercises side stepping 8 ft Rt/Lt with light UE support on counter; cues for even weight shift  Knee/Hip Exercises: Seated   Other Seated Knee/Hip Exercises seated on elevated table, AAROM (overpressure from LLE) into Rt knee flexion x 10 sec x 5 reps    LAQ  RLE x 10, 3-5 sec hold in extension   Sit to Sand without UE support;10 reps   core engaged, arms forward, mirror for feedback     Kines[employer]ix   Edema i strips of sensitive skin Rock tape applied in zigzag pattern  50% stretch to incision to assist with scar mobilization,  Reg rock tape to medial Rt knee at joint like to decompress tissue and increase proprioception.                PT Long Term Goals - 05/09/21 1048       PT LONG TERM GOAL #1   Title The patient will be indep with HEP for R LE strengthening, general mobility, stretching.    Time 6    Period Weeks    Status On-going    Target Date 06/12/21      PT LONG TERM GOAL #2   Title The patient will improve R knee AROM to 105 degrees flexion.    Baseline 100: 05/09/21    Time 6     Period Weeks    Status On-going    Target Date 06/12/21      PT LONG TERM GOAL #3   Title The patient will demonstrate LAQ with < 15 degrees extensor lag.    Baseline -22 degrees seated LAQ:05/09/21    Time 6    Period Weeks    Status On-going    Target Date 06/12/21      PT LONG TERM GOAL #4   Title The patient will improve gait to ambulate no device x 500 feet mod indep.    Baseline ambulating with SPC    Time 6    Period Weeks    Status On-going    Target Date 06/12/21      PT LONG TERM GOAL #5   Title The patient will improve gait speed to > or equal to 2.6 ft/sec for community ambulation gait speed.    Baseline 1.76 ft/sec:  05/09/21    Time 6    Period Weeks    Status On-going    Target Date 06/12/21      PT LONG TERM GOAL #6   Title The patient will negotiate stairs in home to access both levels mod indep with one handrail.    Time 6    Period Weeks    Status On-going    Target Date 06/12/21      PT LONG TERM GOAL #7   Title Improve FOTO to 66%.    Time 6    Period Weeks    Status On-going    Target Date 06/12/21                   Plan - 05/11/21 1053     Clinical Impression Statement Observed improved gait quality with more even weight shift and step length with cues.  He is able to ascend 6" steps better than previous session, but is unable to descend 6" forward.  Progressing well towards LTGs.    Rehab Potential Good    PT Frequency 2x / week    PT Duration 6 weeks    PT Treatment/Interventions ADLs/Self Care Home Management;Patient/family education;Gait training;Stair training;Functional mobility training;Therapeutic activities;Aquatic Therapy;Cryotherapy;Vasopneumatic Device;Therapeutic exercise;Balance training;Neuromuscular re-education;Passive range of motion;Scar mobilization;Moist Heat;Electrical Stimulation;Taping;Manual techniques;Dry needling  PT Next Visit Plan Continue progressive ROM and strengthening for Rt knee/hip.  PROM into knee  ext. Assess response to tape and reapplied if beneficial.  (NO Vaso -not covered)    PT Home Exercise Plan [id number]    Consulted and Agree with Plan of Care Patient             Patient will benefit from skilled therapeutic intervention in order to improve the following deficits and impairments:  Abnormal gait, Decreased range of motion, Increased fascial restricitons, Impaired tone, Pain, Decreased balance, Decreased scar mobility, Hypomobility, Impaired flexibility, Decreased strength, Increased edema  Visit Diagnosis: Acute pain of right knee  Muscle weakness (generalized)  Other abnormalities of gait and mobility  Localized edema     Problem List Patient Active Problem List   Diagnosis Date Noted   Arthritis of right knee    S/P total knee replacement, 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

## 2021-05-14 ENCOUNTER — Other Ambulatory Visit: Payer: Self-pay

## 2021-05-14 ENCOUNTER — Encounter: Payer: Self-pay | Admitting: Orthopedic Surgery

## 2021-05-14 ENCOUNTER — Ambulatory Visit (INDEPENDENT_AMBULATORY_CARE_PROVIDER_SITE_OTHER): Payer: 59 | Admitting: Surgical

## 2021-05-14 DIAGNOSIS — Z96651 Presence of right artificial knee joint: Secondary | ICD-10-CM

## 2021-05-15 ENCOUNTER — Ambulatory Visit: Payer: 59 | Admitting: Physical Therapy

## 2021-05-15 DIAGNOSIS — R2689 Other abnormalities of gait and mobility: Secondary | ICD-10-CM

## 2021-05-15 DIAGNOSIS — M6281 Muscle weakness (generalized): Secondary | ICD-10-CM

## 2021-05-15 DIAGNOSIS — C9110 Chronic lymphocytic leukemia of B-cell type not having achieved remission: Secondary | ICD-10-CM | POA: Diagnosis not present

## 2021-05-15 DIAGNOSIS — M25561 Pain in right knee: Secondary | ICD-10-CM

## 2021-05-15 DIAGNOSIS — R600 Localized edema: Secondary | ICD-10-CM

## 2021-05-15 NOTE — Therapy (Signed)
[place], [place], [address] Phone: [phone]   Fax:  [phone]  Physical Therapy Treatment  Patient Details  Name: Kevin Patterson MRN: [id number] Date of Birth: 1955/11/03 Referring Provider (PT): Marcene Duos, MD   Encounter Date: 05/15/2021   PT End of Session - 05/15/21 1020     Visit Number 7    Number of Visits 13    Date for PT Re-Evaluation 06/15/21    Authorization Type aetna--60 visits/year $40 copay    PT Start Time 0930    PT Stop Time 1015    PT Time Calculation (min) 45 min    Activity Tolerance Patient tolerated treatment well    Behavior During Therapy [facility] for tasks assessed/performed             Past Medical History:  Diagnosis Date   Arthritis    Cyst of kidney, acquired    history of , monitored   Hypertension     Past Surgical History:  Procedure Laterality Date   APPENDECTOMY     BACK SURGERY     JOINT REPLACEMENT     left knee   KNEE ARTHROTOMY  12/28/1968   open cleanout   TONSILLECTOMY     TOTAL KNEE ARTHROPLASTY Left 03/13/2015   Procedure: LEFT TOTAL KNEE ARTHROPLASTY;  Surgeon: Elsie Saas, MD;  Location: [place];  Service: Orthopedics;  Laterality: Left;   TOTAL KNEE ARTHROPLASTY Right 04/10/2021   Procedure: RIGHT TOTAL KNEE ARTHROPLASTY;  Surgeon: Meredith Pel, MD;  Location: [place];  Service: Orthopedics;  Laterality: Right;    There were no vitals filed for this visit.   Subjective Assessment - 05/15/21 0940     Subjective Pt states he is more stiff due to the weather but that he stretched a lot this morning    Patient Stated Goals I want to be walking and get back to work.    Currently in Pain? Yes    Pain Score 2     Pain Location Knee    Pain Orientation Right    Pain Descriptors / Indicators --   stiff   Pain Type Surgical pain                [facility] PT Assessment - 05/15/21 0001       Assessment   Medical  Diagnosis R TKR    Referring Provider (PT) Marcene Duos, MD    Onset Date/Surgical Date 04/10/21    Hand Dominance Right    Next MD Visit 05/14/21    Prior Therapy acute, HHPT      AROM   Right Knee Flexion 102   seated                          [facility] Adult PT Treatment/Exercise - 05/15/21 0001       Knee/Hip Exercises: Aerobic   Recumbent Bike partial revolutions x 5 min for warm up      Knee/Hip Exercises: Machines for Strengthening   Total Gym Leg Press 6 plates x 30 bilat LE      Knee/Hip Exercises: Standing   Hip Abduction Stengthening;Right;Left;2 sets;10 reps;Knee straight    Hip Extension Stengthening;Right;Left;10 reps;2 sets    Lateral Step Up Right;2 sets;Hand Hold: 2;10 reps;Step Height: 6"    Forward Step Up Right;Hand Hold: 2;20 reps;Step Height: 6"    Step Down 10 reps;Hand Hold: 2;Step Height: 4"    Rocker  Board 1 minute    Rocker Board Limitations 1 minute each laterally and A/P      Knee/Hip Exercises: Seated   Other Seated Knee/Hip Exercises seated on elevated table, AAROM (overpressure) into Rt knee flexion and extensionx 10 sec x 5 reps    Sit to Sand 10 reps;without UE support                          PT Long Term Goals - 05/09/21 1048       PT LONG TERM GOAL #1   Title The patient will be indep with HEP for R LE strengthening, general mobility, stretching.    Time 6    Period Weeks    Status On-going    Target Date 06/12/21      PT LONG TERM GOAL #2   Title The patient will improve R knee AROM to 105 degrees flexion.    Baseline 100: 05/09/21    Time 6    Period Weeks    Status On-going    Target Date 06/12/21      PT LONG TERM GOAL #3   Title The patient will demonstrate LAQ with < 15 degrees extensor lag.    Baseline -22 degrees seated LAQ:05/09/21    Time 6    Period Weeks    Status On-going    Target Date 06/12/21      PT LONG TERM GOAL #4   Title The patient will improve gait to ambulate no device  x 500 feet mod indep.    Baseline ambulating with SPC    Time 6    Period Weeks    Status On-going    Target Date 06/12/21      PT LONG TERM GOAL #5   Title The patient will improve gait speed to > or equal to 2.6 ft/sec for community ambulation gait speed.    Baseline 1.76 ft/sec:  05/09/21    Time 6    Period Weeks    Status On-going    Target Date 06/12/21      PT LONG TERM GOAL #6   Title The patient will negotiate stairs in home to access both levels mod indep with one handrail.    Time 6    Period Weeks    Status On-going    Target Date 06/12/21      PT LONG TERM GOAL #7   Title Improve FOTO to 66%.    Time 6    Period Weeks    Status On-going    Target Date 06/12/21                   Plan - 05/15/21 1020     Clinical Impression Statement Pt did well with addition of balance board and leg press machine today. Making progress towards ROM goals. He will benefit from continued balance work due to nature of his job    PT Next Visit Plan strength and ROM for Rt knee, balance (NO vaso - not covered)    PT Home Exercise Plan [id number]    Consulted and Agree with Plan of Care Patient             Patient will benefit from skilled therapeutic intervention in order to improve the following deficits and impairments:     Visit Diagnosis: Acute pain of right knee  Muscle weakness (generalized)  Other abnormalities of gait and mobility  Localized edema  Problem List Patient Active Problem List   Diagnosis Date Noted   Arthritis of right knee    S/P total knee replacement, 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

## 2021-05-17 ENCOUNTER — Other Ambulatory Visit: Payer: Self-pay

## 2021-05-17 ENCOUNTER — Ambulatory Visit (HOSPITAL_COMMUNITY)
Admission: RE | Admit: 2021-05-17 | Discharge: 2021-05-17 | Disposition: A | Discharge status: Home / Self Care | Payer: 59 | Source: Ambulatory Visit | Attending: Hematology | Admitting: Hematology

## 2021-05-17 DIAGNOSIS — C9110 Chronic lymphocytic leukemia of B-cell type not having achieved remission: Secondary | ICD-10-CM | POA: Insufficient documentation

## 2021-05-17 LAB — GLUCOSE, CAPILLARY: Glucose-Capillary: 105 mg/dL — ABNORMAL HIGH (ref 70–99)

## 2021-05-17 MED ORDER — FLUDEOXYGLUCOSE F - 18 (FDG) INJECTION
11.0000 | Freq: Once | INTRAVENOUS | Status: AC
  Administered 2021-05-17: 10.91 via INTRAVENOUS

## 2021-05-18 ENCOUNTER — Ambulatory Visit: Payer: 59 | Admitting: Physical Therapy

## 2021-05-18 DIAGNOSIS — M6281 Muscle weakness (generalized): Secondary | ICD-10-CM

## 2021-05-18 DIAGNOSIS — R2689 Other abnormalities of gait and mobility: Secondary | ICD-10-CM

## 2021-05-18 DIAGNOSIS — C9110 Chronic lymphocytic leukemia of B-cell type not having achieved remission: Secondary | ICD-10-CM | POA: Diagnosis not present

## 2021-05-18 DIAGNOSIS — R600 Localized edema: Secondary | ICD-10-CM

## 2021-05-18 DIAGNOSIS — M25561 Pain in right knee: Secondary | ICD-10-CM

## 2021-05-18 NOTE — Therapy (Signed)
[place], [place], [address] Phone: [phone]   Fax:  [phone]  Physical Therapy Treatment  Patient Details  Name: Kevin Patterson MRN: [id number] Date of Birth: 1955/08/10 Referring Provider (PT): Marcene Duos, MD   Encounter Date: 05/18/2021   PT End of Session - 05/18/21 1034     Visit Number 8    Number of Visits 13    Date for PT Re-Evaluation 06/15/21    Authorization Type aetna--60 visits/year $40 copay    PT Start Time 0924    PT Stop Time 1006    PT Time Calculation (min) 42 min    Activity Tolerance Patient tolerated treatment well    Behavior During Therapy [facility] for tasks assessed/performed             Past Medical History:  Diagnosis Date   Arthritis    Cyst of kidney, acquired    history of , monitored   Hypertension     Past Surgical History:  Procedure Laterality Date   APPENDECTOMY     BACK SURGERY     JOINT REPLACEMENT     left knee   KNEE ARTHROTOMY  12/28/1968   open cleanout   TONSILLECTOMY     TOTAL KNEE ARTHROPLASTY Left 03/13/2015   Procedure: LEFT TOTAL KNEE ARTHROPLASTY;  Surgeon: Elsie Saas, MD;  Location: [place];  Service: Orthopedics;  Laterality: Left;   TOTAL KNEE ARTHROPLASTY Right 04/10/2021   Procedure: RIGHT TOTAL KNEE ARTHROPLASTY;  Surgeon: Meredith Pel, MD;  Location: [place];  Service: Orthopedics;  Laterality: Right;    There were no vitals filed for this visit.   Subjective Assessment - 05/18/21 0926     Subjective Pt states he was able to get full revolutions on his bike at home    Patient Stated Goals I want to be walking and get back to work.    Currently in Pain? Yes    Pain Score 2     Pain Location Knee    Pain Orientation Right                [facility] PT Assessment - 05/18/21 0001       Assessment   Medical Diagnosis R TKR    Referring Provider (PT) Marcene Duos, MD    Onset Date/Surgical Date  04/10/21    Hand Dominance Right    Next MD Visit 06/25/21    Prior Therapy acute, HHPT      AROM   Right Knee Extension -3    Right Knee Flexion 100                           [facility] Adult PT Treatment/Exercise - 05/18/21 0001       Knee/Hip Exercises: Aerobic   Recumbent Bike partial revolutions x 5 min for warm up      Knee/Hip Exercises: Standing   Hip Abduction Stengthening;10 reps;Knee straight;Right;Left;2 sets    Abduction Limitations red TB    Hip Extension Stengthening;Right;Left;10 reps;2 sets    Extension Limitations red TB    Lateral Step Up Right;2 sets;Hand Hold: 1;10 reps;Step Height: 6"    Forward Step Up Right;2 sets;Hand Hold: 1;10 reps;Step Height: 6"    Rocker Board 1 minute    Rocker Board Limitations 1 minute each laterally and A/P    SLS with Vectors on foam x 5 intermittent UE support    Other Standing Knee  Exercises tandem stance on foam x 30sec bilat intermittent UE support      Knee/Hip Exercises: Seated   Sit to Sand 10 reps;without UE support      Manual Therapy   Passive ROM PROM Rt knee flex and ext, contract/relax for Rt knee flexion ROM                          PT Long Term Goals - 05/09/21 1048       PT LONG TERM GOAL #1   Title The patient will be indep with HEP for R LE strengthening, general mobility, stretching.    Time 6    Period Weeks    Status On-going    Target Date 06/12/21      PT LONG TERM GOAL #2   Title The patient will improve R knee AROM to 105 degrees flexion.    Baseline 100: 05/09/21    Time 6    Period Weeks    Status On-going    Target Date 06/12/21      PT LONG TERM GOAL #3   Title The patient will demonstrate LAQ with < 15 degrees extensor lag.    Baseline -22 degrees seated LAQ:05/09/21    Time 6    Period Weeks    Status On-going    Target Date 06/12/21      PT LONG TERM GOAL #4   Title The patient will improve gait to ambulate no device x 500 feet mod indep.     Baseline ambulating with SPC    Time 6    Period Weeks    Status On-going    Target Date 06/12/21      PT LONG TERM GOAL #5   Title The patient will improve gait speed to > or equal to 2.6 ft/sec for community ambulation gait speed.    Baseline 1.76 ft/sec:  05/09/21    Time 6    Period Weeks    Status On-going    Target Date 06/12/21      PT LONG TERM GOAL #6   Title The patient will negotiate stairs in home to access both levels mod indep with one handrail.    Time 6    Period Weeks    Status On-going    Target Date 06/12/21      PT LONG TERM GOAL #7   Title Improve FOTO to 66%.    Time 6    Period Weeks    Status On-going    Target Date 06/12/21                   Plan - 05/18/21 1035     Clinical Impression Statement Pt improving ROM and strength. Difficulty with sustained SLS on foam - only able to complete 5 reps due to fatigue. Pt will benefit from continued strength and endurance training    PT Next Visit Plan strength, balance, endurance (NO vaso- not covered)    PT Home Exercise Plan [id number]             Patient will benefit from skilled therapeutic intervention in order to improve the following deficits and impairments:     Visit Diagnosis: Acute pain of right knee  Muscle weakness (generalized)  Other abnormalities of gait and mobility  Localized edema     Problem List Patient Active Problem List   Diagnosis Date Noted   Arthritis of right knee    S/P total  knee replacement, right 04/10/2021   DJD (degenerative joint disease) of knee 03/13/2015    Yosselyn Tax, PT 05/18/2021, 10:36 AM  [facility] [place], [place], [address] Phone: [phone]   Fax:  [phone]  Name: Linton Stolp MRN: [id number] Date of Birth: November 07, 1955

## 2021-05-20 ENCOUNTER — Encounter: Payer: Self-pay | Admitting: Orthopedic Surgery

## 2021-05-20 NOTE — Progress Notes (Signed)
° °  Post-Op Visit Note   Patient: Kevin Patterson           Date of Birth: 11/04/1955           MRN: [id number] Visit Date: 05/14/2021 PCP: Mina Marble, PA-C   Assessment & Plan:  Chief Complaint:  Chief Complaint  Patient presents with   Right Knee - Routine Post Op   Visit Diagnoses: No diagnosis found.  Plan: Patient is a 66 year old male who presents s/p right total knee arthroplasty on 04/10/2021.  He is ambulating with a cane.  Has stiffness that he notes in the morning that improves with ambulation.  This stiffness with immobility is continually improving.  Taking over-the-counter Tylenol for pain control as needed.  Going to physical therapy 2 times per week.  He has reached 102 degrees of flexion in PT.  He does not really have much difficulty with ascending or descending stairs.  He does have some difficulty with getting up from a toilet seat.  On exam he has palpable DP pulse.  No calf tenderness.  Negative Homans' sign.  Able to perform straight leg raise with about 3 degrees of extensor lag.  5 -/5 quad strength on exam today.  Incision is well-healed from prior surgery.  No gross mid flexion instability.  He ambulates well without any buckling of the knee.  He has range of motion from 2 degrees extension to about 100 degrees of knee flexion today.  No significant erythema or any sinus tract noted around the incision.  Trace effusion noted.  Plan is continue with physical therapy.  Follow-up in 6 weeks for clinical recheck with Dr. Marlou Sa.  Follow-Up Instructions: No follow-ups on file.   Orders:  No orders of the defined types were placed in this encounter.  No orders of the defined types were placed in this encounter.   Imaging: No results found.  PMFS History: Patient Active Problem List   Diagnosis Date Noted   Arthritis of right knee    S/P total knee replacement, right 04/10/2021   DJD (degenerative joint disease) of knee 03/13/2015   Past Medical  History:  Diagnosis Date   Arthritis    Cyst of kidney, acquired    history of , monitored   Hypertension     History reviewed. No pertinent family history.  Past Surgical History:  Procedure Laterality Date   APPENDECTOMY     BACK SURGERY     JOINT REPLACEMENT     left knee   KNEE ARTHROTOMY  12/28/1968   open cleanout   TONSILLECTOMY     TOTAL KNEE ARTHROPLASTY Left 03/13/2015   Procedure: LEFT TOTAL KNEE ARTHROPLASTY;  Surgeon: Elsie Saas, MD;  Location: [place];  Service: Orthopedics;  Laterality: Left;   TOTAL KNEE ARTHROPLASTY Right 04/10/2021   Procedure: RIGHT TOTAL KNEE ARTHROPLASTY;  Surgeon: Meredith Pel, MD;  Location: [place];  Service: Orthopedics;  Laterality: Right;   Social History   Occupational History   Not on file  Tobacco Use   Smoking status: Former    Types: Cigarettes    Quit date: 04/29/1973    Years since quitting: 48.0   Smokeless tobacco: Not on file  Substance and Sexual Activity   Alcohol use: Yes    Alcohol/week: 3.0 standard drinks    Types: 3 Glasses of wine per week   Drug use: Not on file   Sexual activity: Not on file

## 2021-05-21 ENCOUNTER — Other Ambulatory Visit: Payer: Self-pay

## 2021-05-21 ENCOUNTER — Inpatient Hospital Stay: Payer: 59 | Admitting: Hematology

## 2021-05-21 VITALS — BP 138/77 | HR 76 | Temp 98.1°F | Resp 20 | Wt 223.8 lb

## 2021-05-21 DIAGNOSIS — C9110 Chronic lymphocytic leukemia of B-cell type not having achieved remission: Secondary | ICD-10-CM

## 2021-05-21 NOTE — Progress Notes (Signed)
[name]   HEMATOLOGY/ONCOLOGY CLINIC NOTE  Date of Service: 05/21/2021  Patient Care Team: Drosinis, Pamalee Leyden, PA-C as PCP - General (Internal Medicine)  CHIEF COMPLAINTS/PURPOSE OF CONSULTATION:  Follow-up for newly diagnosed chronic lymphocytic leukemia  HISTORY OF PRESENTING ILLNESS:   Kevin Patterson is a wonderful 66 y.o. male who has been referred to [place] by Dr Marcene Duos MD for evaluation and management of incidentally noted lymphocytosis.  Patient has a history of hypertension, arthritis, renal cyst who was seen on 03/07/2021 by Dr. Marcene Duos for evaluation of his severe right knee arthritis and consideration of right knee TKA.  Patient was scheduled for a right total knee arthroplasty on 04/10/2021 and preoperative labs on 04/06/2021 showed WBC count of 16.7k with 8k lymphocytes, 4.1k monocytes, normal hemoglobin of 13.8 with an MCV of 91 and normal platelets of 201k Patient subsequently had a peripheral blood flow cytometry on 04/10/2021 which showed a monoclonal B-cell population with coexpression of CD5 comprising 88% of all lymphocytes.  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  CLL versus mantle cell lymphoma.  Patient has completed his right knee TKA and is working with physical therapy and progressing well.  He notes he has not had any recent change in clinical status over the last 3 to 6 months. No unexplained fevers/chills/drenching night sweats. No unexplained weight loss. No new lumps or bumps in his neck under the armpits or in the groin. No shortness of breath, chest pain, abdominal pain or distention.  No other acute new focal symptoms. No other symptoms of infection. No previous history of blood transfusions.  INTERVAL HISTORY 05/21/2021.  [name] is here for follow-up to discuss his lab work-up and PET CT scan and to discuss his new diagnosis of chronic lymphocytic leukemia. He notes no acute new  symptoms since his last clinic visit. We discussed his labs in detail which show some mild anemia with a hemoglobin of 11, stable leukocytosis/lymphocytosis.  His PET/CT results were discussed in detail. We had a detailed discussion regarding all aspects of his newly diagnosed CLL.  He notes no fevers no chills no night sweats no unexpected weight loss.  No new fatigue. Improving right knee mobility with physical therapy.  Minimal right calf and ankle swelling which resolves overnight with elevation.  No warmth redness or discomfort in the calf.  MEDICAL HISTORY:  Past Medical History:  Diagnosis Date   Arthritis    Cyst of kidney, acquired    history of , monitored   Hypertension     SURGICAL HISTORY: Past Surgical History:  Procedure Laterality Date   APPENDECTOMY     BACK SURGERY     JOINT REPLACEMENT     left knee   KNEE ARTHROTOMY  12/28/1968   open cleanout   TONSILLECTOMY     TOTAL KNEE ARTHROPLASTY Left 03/13/2015   Procedure: LEFT TOTAL KNEE ARTHROPLASTY;  Surgeon: Elsie Saas, MD;  Location: [facility];  Service: Orthopedics;  Laterality: Left;   TOTAL KNEE ARTHROPLASTY Right 04/10/2021   Procedure: RIGHT TOTAL KNEE ARTHROPLASTY;  Surgeon: Meredith Pel, MD;  Location: [facility];  Service: Orthopedics;  Laterality: Right;    SOCIAL HISTORY: Social History   Socioeconomic History   Marital status: Married    Spouse name: Not on file   Number of children: Not on file   Years of education: Not on file   Highest education level: Not on file  Occupational History   Not on file  Tobacco  Use   Smoking status: Former    Types: Cigarettes    Quit date: 04/29/1973    Years since quitting: 48.0   Smokeless tobacco: Not on file  Substance and Sexual Activity   Alcohol use: Yes    Alcohol/week: 3.0 standard drinks    Types: 3 Glasses of wine per week   Drug use: Not on file   Sexual activity: Not on file  Other Topics Concern   Not on file  Social History Narrative    Not on file   Social Determinants of Health   Financial Resource Strain: Not on file  Food Insecurity: Not on file  Transportation Needs: Not on file  Physical Activity: Not on file  Stress: Not on file  Social Connections: Not on file  Intimate Partner Violence: Not on file    FAMILY HISTORY: No reported family history of known blood disorders or cancers.  ALLERGIES:  has No Known Allergies.  MEDICATIONS:  Current Outpatient Medications  Medication Sig Dispense Refill   Ascorbic Acid (VITAMIN C) 1000 MG tablet Take 1,000 mg by mouth daily.     Ashwagandha 500 MG CAPS Take 500 mg by mouth daily.     aspirin 81 MG chewable tablet Chew 1 tablet (81 mg total) by mouth 2 (two) times daily. 60 tablet 0   Calcium Citrate-Vitamin D (CALCIUM + D PO) Take 1 tablet by mouth daily.     cholecalciferol (VITAMIN D3) 25 MCG (1000 UNIT) tablet Take 1,000 Units by mouth daily.     Flaxseed, Linseed, (FLAXSEED OIL) 1000 MG CAPS Take 2,000 mg by mouth daily.     L-Arginine 500 MG CAPS Take 500 mg by mouth daily.     methocarbamol (ROBAXIN) 500 MG tablet Take 1 tablet (500 mg total) by mouth every 8 (eight) hours as needed for muscle spasms. 30 tablet 0   milk thistle 175 MG tablet Take 175 mg by mouth daily.     Multiple Vitamins-Minerals (MULTIVITAMIN WITH MINERALS) tablet Take 1 tablet by mouth daily.     olmesartan-hydrochlorothiazide (BENICAR HCT) 20-12.5 MG tablet Take 1 tablet by mouth daily.     OVER THE COUNTER MEDICATION Apply 1 application topically 2 (two) times daily as needed (pain relief). CBD Oil with Emu Oil 500 mg     oxyCODONE (OXY IR/ROXICODONE) 5 MG immediate release tablet Take 1-2 tablets (5-10 mg total) by mouth every 4 (four) hours as needed for moderate pain (pain score 4-6). 30 tablet 0   oxyCODONE (OXY IR/ROXICODONE) 5 MG immediate release tablet 1 po q 4 hrs prn pain 45 tablet 0   tadalafil (CIALIS) 5 MG tablet Take 5 mg by mouth daily as needed for erectile  dysfunction.     Vitamin A 2400 MCG (8000 UT) CAPS Take 8,000 Units by mouth daily.     zinc gluconate 50 MG tablet Take 50 mg by mouth daily.     No current facility-administered medications for this visit.    REVIEW OF SYSTEMS:   .10 Point review of Systems was done is negative except as noted above.  PHYSICAL EXAMINATION: ECOG PERFORMANCE STATUS: 1  . Vitals:   05/21/21 1045  BP: 138/77  Pulse: 76  Resp: 20  Temp: 98.1 F (36.7 C)  SpO2: 100%   Filed Weights   05/21/21 1045  Weight: 223 lb 12.8 oz (101.5 kg)   .Body mass index is 28.73 kg/m. [name] GENERAL:alert, in no acute distress and comfortable SKIN: no acute rashes, no significant  lesions EYES: conjunctiva are pink and non-injected, sclera anicteric OROPHARYNX: MMM, no exudates, no oropharyngeal erythema or ulceration NECK: supple, no JVD LYMPH:  no palpable lymphadenopathy in the cervical, axillary or inguinal regions LUNGS: clear to auscultation b/l with normal respiratory effort HEART: regular rate & rhythm ABDOMEN:  normoactive bowel sounds , non tender, not distended. Extremity: no pedal edema PSYCH: alert & oriented x 3 with fluent speech NEURO: no focal motor/sensory deficits   LABORATORY DATA:  I have reviewed the data as listed  CBC Latest Ref Rng & Units 05/09/2021 04/11/2021 04/10/2021  WBC 4.0 - 10.5 K/uL 18.3(H) 22.4(H) 16.7(H)  Hemoglobin 13.0 - 17.0 g/dL 11.1(L) 11.0(L) 12.9(L)  Hematocrit 39.0 - 52.0 % 36.9(L) 35.2(L) 40.8  Platelets 150 - 400 K/uL 277 182 180   .CBC    Component Value Date/Time   WBC 18.3 (H) 05/09/2021 1414   RBC 3.99 (L) 05/09/2021 1415   RBC 4.00 (L) 05/09/2021 1414   HGB 11.1 (L) 05/09/2021 1414   HCT 36.9 (L) 05/09/2021 1414   PLT 277 05/09/2021 1414   MCV 92.3 05/09/2021 1414   MCH 27.8 05/09/2021 1414   MCHC 30.1 05/09/2021 1414   RDW 14.2 05/09/2021 1414   LYMPHSABS 8.8 (H) 05/09/2021 1414   MONOABS 1.8 (H) 05/09/2021 1414   EOSABS 0.4 05/09/2021 1414    BASOSABS 0.0 05/09/2021 1414     . CMP Latest Ref Rng & Units 05/09/2021 04/11/2021 04/10/2021  Glucose 70 - 99 mg/dL 90 123(H) 102(H)  BUN 8 - 23 mg/dL 13 11 13   Creatinine 0.61 - 1.24 mg/dL 0.98 1.14 1.00  Sodium 135 - 145 mmol/L 138 133(L) 135  Potassium 3.5 - 5.1 mmol/L 4.1 4.3 4.4  Chloride 98 - 111 mmol/L 102 101 106  CO2 22 - 32 mmol/L 27 27 23   Calcium 8.9 - 10.3 mg/dL 9.6 8.8(L) 9.2  Total Protein 6.5 - 8.1 g/dL 7.6 - -  Total Bilirubin 0.3 - 1.2 mg/dL 0.8 - -  Alkaline Phos 38 - 126 U/L 104 - -  AST 15 - 41 U/L 17 - -  ALT 0 - 44 U/L 10 - -   Component     Latest Ref Rng & Units 05/09/2021  DAT, complement      NEG  DAT, IgG      NEG . . .  Ferritin     24 - 336 ng/mL 193  Vitamin B12     180 - 914 pg/mL 479  Haptoglobin     32 - 363 mg/dL 193  HIV Screen 4th Generation wRfx     Non Reactive Non Reactive  HCV Ab     NON REACTIVE NON REACTIVE  Hep B Core Total Ab     NON REACTIVE NON REACTIVE  Hepatitis B Surface Ag     NON REACTIVE NON REACTIVE  LDH     98 - 192 U/L 133      RADIOGRAPHIC STUDIES: I have personally reviewed the radiological images as listed and agreed with the findings in the report. NM PET Image Initial (PI) Skull Base To Thigh  Result Date: 05/17/2021 CLINICAL DATA:  Initial treatment strategy for chronic lymphocytic leukemia. EXAM: NUCLEAR MEDICINE PET SKULL BASE TO THIGH TECHNIQUE: 10.91 mCi F-18 FDG was injected intravenously. Full-ring PET imaging was performed from the skull base to thigh after the radiotracer. CT data was obtained and used for attenuation correction and anatomic localization. Fasting blood glucose: 105 mg/dl COMPARISON:  CT January 26, 2020  and October 15, 2016 FINDINGS: Mediastinal blood pool activity: SUV max 2.4 Liver activity: SUV max 3.3 NECK: No hypermetabolic lymph nodes in the neck. Hypermetabolic hyperplasia of the tonsils slightly asymmetric to the right with a max SUV of 5.1. Bilateral symmetric diffuse  parotid gland hypermetabolism with max SUV of 4.9 on the right and 4.7 on the left without focal hypermetabolism or CT correlate, likely inflammatory. Incidental CT findings: Dental hardware. CHEST: Low level hypermetabolic activity in prominent bilateral axillary lymph nodes measuring up to 9 mm in the right axilla with a max SUV of 2.07 on image 65/4 and measuring up to 8 mm in the left axilla with a max SUV of 1.9. No hypermetabolic mediastinal or hilar adenopathy. Incidental CT findings: Aortic atherosclerosis without aneurysmal dilation. Normal size heart. No significant pericardial effusion/thickening. No suspicious pulmonary nodules or masses. No pleural effusion. No pneumothorax. ABDOMEN/PELVIS: Hypermetabolic right iliac side chain lymph nodes for instance a right common iliac lymph node which measures 12 mm in short axis with a max SUV of 4.91 and a right pelvic sidewall lymph node measuring 6 mm in short axis with a max SUV of 3.85. Mildly hypermetabolic retroperitoneal and left iliac chain/pelvic sidewall lymph nodes, for reference: A 9 mm left periaortic lymph node on image 143/4 with a max SUV of 2.24 and a 7 mm left pelvic sidewall lymph node on image 182/4 with a max SUV of 2.86. Asymmetric nodular hypermetabolic activity along the right lobe of the prostate with a max SUV of 7.26. Hypermetabolic focus in the left hemipelvis along the gonadal vessels with a max SUV of 6.75 but no discrete CT correlate may be vascular in etiology, attention on follow-up imaging suggested. No abnormal splenic or hepatic hypermetabolism and no splenomegaly. Incidental CT findings: Unremarkable noncontrast appearance of the hepatic parenchyma. Non hypermetabolic hypodense renal lesions consistent with renal cysts. TURP defect in the prostate. Colonic diverticulosis without findings of acute diverticulitis. Aortic atherosclerosis without aneurysmal dilation. SKELETON: No focal hypermetabolic activity to suggest skeletal  metastasis. Incidental CT findings: Lumbosacral fixation hardware. Multilevel degenerative changes spine. No aggressive lytic or blastic lesion of bone. Degenerative changes bilateral shoulders, hips and SI joints. IMPRESSION: 1. Hypermetabolic prominent/mildly enlarged right pelvic lymph nodes, with activity greater than liver ([place] 4). 2. Low level metabolic activity within prominent bilateral axillary, retroperitoneal and left pelvic sidewall lymph nodes. 3.  No splenomegaly. 4. Asymmetric nodular hyperactivity on the right side of the prostate gland with a max SUV of 7.26 without discrete CT correlate, nonspecific but prostate carcinoma not excluded, recommend correlation with PSA and urology consult. 5. Hypermetabolic focus in the left hemipelvis along the gonadal vessels with a max SUV of 6.75, without discrete CT correlate and possibly vascular in etiology, attention on follow-up imaging suggested. 6. Hypermetabolic hyperplasia of the tonsils slightly asymmetric to the right with a max SUV of 5.1 common likely reactive. 7. Bilateral symmetric diffuse parotid gland hypermetabolism with max SUV of 4.9 on the right and 4.7 on the left without focal hypermetabolism or CT correlate, likely inflammatory Electronically Signed   By: Dahlia Bailiff M.D.   On: 05/17/2021 12:59    ASSESSMENT & PLAN:   66 year old very pleasant gentleman with  1) Newly diagnosed chronic lymphocytic leukemia T(11;14) negative -rules out mantle cell lymphoma peripheral blood flow cytometric findings showing monoclonal CD5 positive fight population making of 88% of his lymphocytes and the morphology of the cells is consistent with a CD5 positive non-Hodgkin's lymphoma/leukemia. Incidentally noted on labs  in the context of preoperative labs prior to his recent right TKA. PLAN -Patient's lab results were discussed with him in details - FISH for t(11;14) was negative and ruled out mantle cell lymphoma. -We discussed his new  diagnosis of chronic lymphocytic leukemia , prognosis, natural history, treatment considerations and indications, philosophy of treatment, prognostic elements. -His CLL FISH panel shows 13 q. deletion and 11 q. Deletion.  We discussed the negative effects could potentially be considered higher than average risk. -Patient has mild anemia with a hemoglobin of 11 due to blood loss from recent knee surgery.  No thrombocytopenia. -PET CT scan shows mild lymphadenopathy.  No bulky disease.  No splenomegaly.  No organ threatening disease. -Patient has no constitutional symptoms. -No clear indication for initiating treatment for the patient's CLL at this time. -We shall see him back with labs in 3 months and space out his appointments as indicated by his symptoms and clinical course. -Patient was provided additional printed material for education regarding his new diagnosis of CLL on his questions were answered.  2) incidental note of FDG avid prostate lesions. Patient has a family history of prostate cancer. He notes that he has been followed by urology previously for elevated PSA levels and he has had prostate biopsy previously PLAN -He notes his last PSA level was 1 year ago and his last urology visit was about 2 years ago. -He was recommended to follow-up with his urologist in [facility].  He will let us know if he needs a referral from [place] or if any other questions or concerns   Follow-up  Return to clinic with Dr. Irene Limbo with labs in 3 months  Orders Placed This Encounter  Procedures   CBC with Differential/Platelet    Standing Status:   Future    Standing Expiration Date:   05/21/2022   CMP ([facility] only)    Standing Status:   Future    Standing Expiration Date:   05/21/2022   Lactate dehydrogenase    Standing Status:   Future    Standing Expiration Date:   05/21/2022   Reticulocytes    Standing Status:   Future    Standing Expiration Date:   05/21/2022     All of the patients  questions were answered with apparent satisfaction. The patient knows to call the clinic with any problems, questions or concerns.  . The total time spent in the appointment was 30 minutes discussing and reviewing labs, PET CT scan, discussion and education regarding new diagnosis of CLL, plan for follow-up and documentation.   Sullivan Lone MD [place] AAHIVMS [facility] Hematology/Oncology Physician [facility]

## 2021-05-22 ENCOUNTER — Ambulatory Visit: Payer: 59 | Admitting: Physical Therapy

## 2021-05-22 ENCOUNTER — Encounter: Payer: Self-pay | Admitting: Physical Therapy

## 2021-05-22 DIAGNOSIS — R2689 Other abnormalities of gait and mobility: Secondary | ICD-10-CM

## 2021-05-22 DIAGNOSIS — M25561 Pain in right knee: Secondary | ICD-10-CM

## 2021-05-22 DIAGNOSIS — M6281 Muscle weakness (generalized): Secondary | ICD-10-CM

## 2021-05-22 DIAGNOSIS — C9110 Chronic lymphocytic leukemia of B-cell type not having achieved remission: Secondary | ICD-10-CM | POA: Diagnosis not present

## 2021-05-22 LAB — FISH,CLL PROGNOSTIC PANEL

## 2021-05-22 LAB — MISCELLANEOUS TEST

## 2021-05-22 NOTE — Therapy (Signed)
[place], [place], [address] Phone: [phone]   Fax:  [phone]  Physical Therapy Treatment  Patient Details  Name: Kevin Patterson MRN: [id number] Date of Birth: [birth year] Referring Provider (PT): Marcene Duos, MD   Encounter Date: 05/22/2021   PT End of Session - 05/22/21 0933     Visit Number 9    Number of Visits 13    Date for PT Re-Evaluation 06/15/21    Authorization Type aetna--60 visits/year $40 copay    PT Start Time 0933    PT Stop Time 1015    PT Time Calculation (min) 42 min    Activity Tolerance Patient tolerated treatment well    Behavior During Therapy [facility] for tasks assessed/performed             Past Medical History:  Diagnosis Date   Arthritis    Cyst of kidney, acquired    history of , monitored   Hypertension     Past Surgical History:  Procedure Laterality Date   APPENDECTOMY     BACK SURGERY     JOINT REPLACEMENT     left knee   KNEE ARTHROTOMY  12/28/1968   open cleanout   TONSILLECTOMY     TOTAL KNEE ARTHROPLASTY Left 03/13/2015   Procedure: LEFT TOTAL KNEE ARTHROPLASTY;  Surgeon: Elsie Saas, MD;  Location: [facility];  Service: Orthopedics;  Laterality: Left;   TOTAL KNEE ARTHROPLASTY Right 04/10/2021   Procedure: RIGHT TOTAL KNEE ARTHROPLASTY;  Surgeon: Meredith Pel, MD;  Location: [facility];  Service: Orthopedics;  Laterality: Right;    There were no vitals filed for this visit.   Subjective Assessment - 05/22/21 0937     Subjective Pt complains of cramping in the night; stretched and ate mustard for relief.  Stiffness is chief complaint.    Patient Stated Goals I want to be walking and get back to work.    Currently in Pain? No/denies    Pain Score 0-No pain                [facility] PT Assessment - 05/22/21 0001       Assessment   Medical Diagnosis R TKR    Referring Provider (PT) Marcene Duos, MD    Onset Date/Surgical Date  04/10/21    Hand Dominance Right    Next MD Visit 06/25/21    Prior Therapy acute, HHPT              [facility] Adult PT Treatment/Exercise - 05/22/21 0001       Knee/Hip Exercises: Stretches   Passive Hamstring Stretch Right;3 reps;30 seconds    Passive Hamstring Stretch Limitations standing with foot on chair.    Hip Flexor Stretch Right;2 reps;30 seconds   thomas position   Knee: Self-Stretch to increase Flexion Right;4 reps;10 seconds;20 seconds   foot on chair, lunging forward.   Gastroc Stretch Right;2 reps;Left;1 rep;30 seconds    Other Knee/Hip Stretches supine butterfly stretch x 20 sec      Knee/Hip Exercises: Aerobic   Recumbent Bike full revolutions to L1      Knee/Hip Exercises: Standing   Lateral Step Up Right;1 set;10 reps;Hand Hold: 2;Step Height: 6"    Forward Step Up Right;1 set;10 reps;Step Height: 6"    Step Down Left;1 set;10 reps   and retro step up with RLE.   Other Standing Knee Exercises tandem gait (with HHA) forward/ backward 12 ft x 2  Manual Therapy   Passive ROM PROM for Rt knee into flex/ ext.  Knee flexion with hip flex/ abdct.  Rt hip ER.              PT Long Term Goals - 05/09/21 1048       PT LONG TERM GOAL #1   Title The patient will be indep with HEP for R LE strengthening, general mobility, stretching.    Time 6    Period Weeks    Status On-going    Target Date 06/12/21      PT LONG TERM GOAL #2   Title The patient will improve R knee AROM to 105 degrees flexion.    Baseline 100: 05/09/21    Time 6    Period Weeks    Status On-going    Target Date 06/12/21      PT LONG TERM GOAL #3   Title The patient will demonstrate LAQ with < 15 degrees extensor lag.    Baseline -22 degrees seated LAQ:05/09/21    Time 6    Period Weeks    Status On-going    Target Date 06/12/21      PT LONG TERM GOAL #4   Title The patient will improve gait to ambulate no device x 500 feet mod indep.    Baseline ambulating with SPC    Time 6     Period Weeks    Status On-going    Target Date 06/12/21      PT LONG TERM GOAL #5   Title The patient will improve gait speed to > or equal to 2.6 ft/sec for community ambulation gait speed.    Baseline 1.76 ft/sec:  05/09/21    Time 6    Period Weeks    Status On-going    Target Date 06/12/21      PT LONG TERM GOAL #6   Title The patient will negotiate stairs in home to access both levels mod indep with one handrail.    Time 6    Period Weeks    Status On-going    Target Date 06/12/21      PT LONG TERM GOAL #7   Title Improve FOTO to 66%.    Time 6    Period Weeks    Status On-going    Target Date 06/12/21                   Plan - 05/22/21 1130     Clinical Impression Statement Pt's Rt knee ROM and gait quality gradually improving.  He reported fatigue with tandem gait; required HHA for balance.  Encouraged pt to work on PROM to knee at home with assistance of wife and to use his stationary bike often to keep knee from feeling stiff.  Progressing towards goals.    Rehab Potential Good    PT Frequency 2x / week    PT Duration 6 weeks    PT Treatment/Interventions ADLs/Self Care Home Management;Patient/family education;Gait training;Stair training;Functional mobility training;Therapeutic activities;Aquatic Therapy;Cryotherapy;Vasopneumatic Device;Therapeutic exercise;Balance training;Neuromuscular re-education;Passive range of motion;Scar mobilization;Moist Heat;Electrical Stimulation;Taping;Manual techniques;Dry needling    PT Next Visit Plan strength, balance, endurance (NO vaso- not covered) - ck gait speed (LTG#5)   PT Home Exercise Plan [id number]             Patient will benefit from skilled therapeutic intervention in order to improve the following deficits and impairments:  Abnormal gait, Decreased range of motion, Increased fascial restricitons, Impaired tone, Pain, Decreased balance,  Decreased scar mobility, Hypomobility, Impaired flexibility, Decreased  strength, Increased edema  Visit Diagnosis: Acute pain of right knee  Muscle weakness (generalized)  Other abnormalities of gait and mobility     Problem List Patient Active Problem List   Diagnosis Date Noted   Arthritis of right knee    S/P total knee replacement, 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

## 2021-05-23 ENCOUNTER — Telehealth: Payer: Self-pay | Admitting: Hematology

## 2021-05-23 NOTE — Telephone Encounter (Signed)
Left message with follow-up appointment per 1/23 los.

## 2021-05-24 ENCOUNTER — Ambulatory Visit: Payer: 59 | Admitting: Hematology

## 2021-05-25 ENCOUNTER — Ambulatory Visit: Payer: 59 | Admitting: Physical Therapy

## 2021-05-25 ENCOUNTER — Other Ambulatory Visit: Payer: Self-pay

## 2021-05-25 DIAGNOSIS — C9110 Chronic lymphocytic leukemia of B-cell type not having achieved remission: Secondary | ICD-10-CM | POA: Diagnosis not present

## 2021-05-25 DIAGNOSIS — R600 Localized edema: Secondary | ICD-10-CM

## 2021-05-25 DIAGNOSIS — M6281 Muscle weakness (generalized): Secondary | ICD-10-CM

## 2021-05-25 DIAGNOSIS — R2689 Other abnormalities of gait and mobility: Secondary | ICD-10-CM

## 2021-05-25 DIAGNOSIS — M25561 Pain in right knee: Secondary | ICD-10-CM

## 2021-05-25 NOTE — Therapy (Signed)
[place], [place], [address] Phone: [phone]   Fax:  [phone]  Physical Therapy Treatment  Patient Details  Name: Kevin Patterson MRN: [id number] Date of Birth: 08-28-1955 Referring Provider (PT): Marcene Duos, MD   Encounter Date: 05/25/2021   PT End of Session - 05/25/21 1011     Visit Number 10    Number of Visits 13    Date for PT Re-Evaluation 06/15/21    Authorization Type aetna--60 visits/year $40 copay    PT Start Time 0927    PT Stop Time 1008    PT Time Calculation (min) 41 min    Activity Tolerance Patient tolerated treatment well    Behavior During Therapy [facility] for tasks assessed/performed             Past Medical History:  Diagnosis Date   Arthritis    Cyst of kidney, acquired    history of , monitored   Hypertension     Past Surgical History:  Procedure Laterality Date   APPENDECTOMY     BACK SURGERY     JOINT REPLACEMENT     left knee   KNEE ARTHROTOMY  12/28/1968   open cleanout   TONSILLECTOMY     TOTAL KNEE ARTHROPLASTY Left 03/13/2015   Procedure: LEFT TOTAL KNEE ARTHROPLASTY;  Surgeon: Elsie Saas, MD;  Location: [place];  Service: Orthopedics;  Laterality: Left;   TOTAL KNEE ARTHROPLASTY Right 04/10/2021   Procedure: RIGHT TOTAL KNEE ARTHROPLASTY;  Surgeon: Meredith Pel, MD;  Location: [place];  Service: Orthopedics;  Laterality: Right;    There were no vitals filed for this visit.   Subjective Assessment - 05/25/21 0930     Subjective Pt states his chief complaint is still "stiffness". He states he has been stretching and riding his bike at home    Patient Stated Goals I want to be walking and get back to work.    Currently in Pain? No/denies                [facility] PT Assessment - 05/25/21 0001       Assessment   Medical Diagnosis R TKR    Referring Provider (PT) Marcene Duos, MD    Onset Date/Surgical Date 04/10/21    Hand  Dominance Right    Next MD Visit 06/25/21      Ambulation/Gait   Gait velocity 2.2 ft/sec without AD                           [facility] Adult PT Treatment/Exercise - 05/25/21 0001       Knee/Hip Exercises: Stretches   Passive Hamstring Stretch Right;3 reps;30 seconds    Passive Hamstring Stretch Limitations standing with foot on chair.    Knee: Self-Stretch to increase Flexion Right;4 reps;10 seconds;20 seconds   foot on chair, lunging forward.     Knee/Hip Exercises: Aerobic   Recumbent Bike full revolutions x 5 min      Knee/Hip Exercises: Standing   Heel Raises 2 sets;10 reps    Heel Raises Limitations holding 5# dumbells    Lateral Step Up Right;2 sets;10 reps;Hand Hold: 1;Step Height: 6"    Forward Step Up Right;2 sets;10 reps;Hand Hold: 1;Step Height: 6"    Step Down Left;1 set;10 reps;Step Height: 4";Hand Hold: 1   and retro step up with RLE.   Wall Squat 10 reps    Wall Squat Limitations with physioball  behind back    SLS with Vectors x 10 with intermittent UE support    Other Standing Knee Exercises gait stepping over noodles with focus on increasing knee flexion and improving balance    Other Standing Knee Exercises sidestep red TB x 2 laps      Manual Therapy   Passive ROM PROM knee flex ext                          PT Long Term Goals - 05/25/21 1013       PT LONG TERM GOAL #5   Title The patient will improve gait speed to > or equal to 2.6 ft/sec for community ambulation gait speed.    Baseline 2.2 ft/sec on 05/25/21                   Plan - 05/25/21 1012     Clinical Impression Statement Pt has improved gait speed to 2.2 ft/sec, progressing towards LTGs. Pt with difficult with gait over pool noodles today. PT educated pt on importance of balance training as well as strengthening    PT Next Visit Plan blance, endurance, strength (NO VASO - not covered)    PT Home Exercise Plan [id number]    Consulted and Agree with Plan  of Care Patient             Patient will benefit from skilled therapeutic intervention in order to improve the following deficits and impairments:     Visit Diagnosis: Acute pain of right knee  Muscle weakness (generalized)  Other abnormalities of gait and mobility  Localized edema     Problem List Patient Active Problem List   Diagnosis Date Noted   Arthritis of right knee    S/P total knee replacement, right 04/10/2021   DJD (degenerative joint disease) of knee 03/13/2015    Tiki Tucciarone, PT 05/25/2021, 10:17 AM  [facility] [place], [place], [address] Phone: [phone]   Fax:  [phone]  Name: Rodolfo Gaster MRN: [id number] Date of Birth: 1956/02/27

## 2021-05-29 ENCOUNTER — Ambulatory Visit: Payer: 59 | Admitting: Physical Therapy

## 2021-05-29 ENCOUNTER — Other Ambulatory Visit: Payer: Self-pay

## 2021-05-29 DIAGNOSIS — C9110 Chronic lymphocytic leukemia of B-cell type not having achieved remission: Secondary | ICD-10-CM | POA: Diagnosis not present

## 2021-05-29 DIAGNOSIS — R600 Localized edema: Secondary | ICD-10-CM

## 2021-05-29 DIAGNOSIS — R2689 Other abnormalities of gait and mobility: Secondary | ICD-10-CM

## 2021-05-29 DIAGNOSIS — M25561 Pain in right knee: Secondary | ICD-10-CM

## 2021-05-29 DIAGNOSIS — M6281 Muscle weakness (generalized): Secondary | ICD-10-CM

## 2021-05-29 NOTE — Therapy (Signed)
[place], [place], [address] Phone: [phone]   Fax:  [phone]  Physical Therapy Treatment  Patient Details  Name: Kevin Patterson MRN: [id number] Date of Birth: 1956/01/03 Referring Provider (PT): Marcene Duos, MD   Encounter Date: 05/29/2021   PT End of Session - 05/29/21 1055     Visit Number 11    Number of Visits 13    Date for PT Re-Evaluation 06/15/21    Authorization Type aetna--60 visits/year $40 copay    PT Start Time 1015    PT Stop Time 1054    PT Time Calculation (min) 39 min    Activity Tolerance Patient tolerated treatment well    Behavior During Therapy [facility] for tasks assessed/performed             Past Medical History:  Diagnosis Date   Arthritis    Cyst of kidney, acquired    history of , monitored   Hypertension     Past Surgical History:  Procedure Laterality Date   APPENDECTOMY     BACK SURGERY     JOINT REPLACEMENT     left knee   KNEE ARTHROTOMY  12/28/1968   open cleanout   TONSILLECTOMY     TOTAL KNEE ARTHROPLASTY Left 03/13/2015   Procedure: LEFT TOTAL KNEE ARTHROPLASTY;  Surgeon: Elsie Saas, MD;  Location: [place];  Service: Orthopedics;  Laterality: Left;   TOTAL KNEE ARTHROPLASTY Right 04/10/2021   Procedure: RIGHT TOTAL KNEE ARTHROPLASTY;  Surgeon: Meredith Pel, MD;  Location: [place];  Service: Orthopedics;  Laterality: Right;    There were no vitals filed for this visit.   Subjective Assessment - 05/29/21 1018     Subjective Pt states "it still balloons up when i have been on my feet"    Patient Stated Goals I want to be walking and get back to work.    Currently in Pain? No/denies                [facility] PT Assessment - 05/29/21 0001       Assessment   Medical Diagnosis R TKR    Referring Provider (PT) Marcene Duos, MD    Onset Date/Surgical Date 04/10/21    Hand Dominance Right    Next MD Visit 06/25/21                            [facility] Adult PT Treatment/Exercise - 05/29/21 0001       Ambulation/Gait   Gait Comments backward gait with focus on balance and terminal knee extension      Knee/Hip Exercises: Stretches   Passive Hamstring Stretch Right;3 reps;30 seconds    Passive Hamstring Stretch Limitations standing with foot on chair.    Knee: Self-Stretch to increase Flexion Right;4 reps;10 seconds;20 seconds   foot on chair, lunging forward.     Knee/Hip Exercises: Aerobic   Recumbent Bike full revolutions x 5 min    Other Aerobic laps around clinic between exercises      Knee/Hip Exercises: Standing   Heel Raises 2 sets;10 reps    Heel Raises Limitations holding 5# dumbells    Lateral Step Up Right;2 sets;10 reps;Hand Hold: 0;Step Height: 6"    Forward Step Up Right;2 sets;10 reps;Hand Hold: 0;Step Height: 6"    Step Down Right;1 set;10 reps;Step Height: 4";Hand Hold: 0    Wall Squat 15 reps    Wall Squat Limitations  with physioball behind back    SLS on foam multiple attempts with intermittent UE support    Other Standing Knee Exercises tandem stance on foam 2 x 30 sec    Other Standing Knee Exercises sidestep red TB x 2 laps                          PT Long Term Goals - 05/25/21 1013       PT LONG TERM GOAL #5   Title The patient will improve gait speed to > or equal to 2.6 ft/sec for community ambulation gait speed.    Baseline 2.2 ft/sec on 05/25/21                   Plan - 05/29/21 1055     Clinical Impression Statement Pt continues to improve strength and mobility. Biggest deficits are in balance and gait speed. PT encouraged pt to continue to practice SLS at home    PT Next Visit Plan blance, endurance, strength (NO VASO - not covered)    PT Home Exercise Plan [id number]    Consulted and Agree with Plan of Care Patient             Patient will benefit from skilled therapeutic intervention in order to improve the following  deficits and impairments:     Visit Diagnosis: Acute pain of right knee  Muscle weakness (generalized)  Other abnormalities of gait and mobility  Localized edema     Problem List Patient Active Problem List   Diagnosis Date Noted   Arthritis of right knee    S/P total knee replacement, right 04/10/2021   DJD (degenerative joint disease) of knee 03/13/2015    Huie Ghuman, PT 05/29/2021, 10:56 AM  [facility] [place], [place], [address] Phone: [phone]   Fax:  [phone]  Name: Kevin Patterson MRN: [id number] Date of Birth: 10/06/1955

## 2021-06-01 ENCOUNTER — Other Ambulatory Visit: Payer: Self-pay

## 2021-06-01 ENCOUNTER — Encounter: Payer: Self-pay | Admitting: Physical Therapy

## 2021-06-01 ENCOUNTER — Ambulatory Visit: Payer: 59 | Attending: Orthopedic Surgery | Admitting: Physical Therapy

## 2021-06-01 DIAGNOSIS — M6281 Muscle weakness (generalized): Secondary | ICD-10-CM | POA: Insufficient documentation

## 2021-06-01 DIAGNOSIS — M25561 Pain in right knee: Secondary | ICD-10-CM | POA: Diagnosis not present

## 2021-06-01 DIAGNOSIS — R2689 Other abnormalities of gait and mobility: Secondary | ICD-10-CM | POA: Insufficient documentation

## 2021-06-01 DIAGNOSIS — R600 Localized edema: Secondary | ICD-10-CM | POA: Diagnosis present

## 2021-06-01 NOTE — Therapy (Signed)
[place], [place], [address] Phone: [phone]   Fax:  [phone]  Physical Therapy Treatment  Patient Details  Name: Kevin Patterson MRN: [id number] Date of Birth: Apr 22, 1956 Referring Provider (PT): Marcene Duos, MD   Encounter Date: 06/01/2021   PT End of Session - 06/01/21 1018     Visit Number 12    Number of Visits 13    Date for PT Re-Evaluation 06/15/21    Authorization Type aetna--60 visits/year $40 copay    PT Start Time 1016    PT Stop Time 1055    PT Time Calculation (min) 39 min    Behavior During Therapy [facility] for tasks assessed/performed             Past Medical History:  Diagnosis Date   Arthritis    Cyst of kidney, acquired    history of , monitored   Hypertension     Past Surgical History:  Procedure Laterality Date   APPENDECTOMY     BACK SURGERY     JOINT REPLACEMENT     left knee   KNEE ARTHROTOMY  12/28/1968   open cleanout   TONSILLECTOMY     TOTAL KNEE ARTHROPLASTY Left 03/13/2015   Procedure: LEFT TOTAL KNEE ARTHROPLASTY;  Surgeon: Elsie Saas, MD;  Location: [place];  Service: Orthopedics;  Laterality: Left;   TOTAL KNEE ARTHROPLASTY Right 04/10/2021   Procedure: RIGHT TOTAL KNEE ARTHROPLASTY;  Surgeon: Meredith Pel, MD;  Location: [place];  Service: Orthopedics;  Laterality: Right;    There were no vitals filed for this visit.   Subjective Assessment - 06/01/21 1019     Subjective Pt reports that Wednesday was awful; back pain was significant.  He received injections in back.  Now his groin is still aching.    Currently in Pain? Yes    Pain Score 3     Pain Location Groin    Pain Orientation Right    Pain Descriptors / Indicators Aching                [facility] PT Assessment - 06/01/21 0001       Assessment   Medical Diagnosis R TKR    Referring Provider (PT) Marcene Duos, MD    Onset Date/Surgical Date 04/10/21    Hand  Dominance Right    Next MD Visit 06/25/21      AROM   Right Knee Extension -18   LAQ   Right Knee Flexion --      PROM   Right Knee Extension -4    Right Knee Flexion 112               [facility] Adult PT Treatment/Exercise - 06/01/21 0001       Self-Care   Other Self-Care Comments  instructed pt in MFR to Rt psoas with ball in prone; pt returned demo with cues.      Knee/Hip Exercises: Stretches   Passive Hamstring Stretch Right;3 reps;20 seconds    Passive Hamstring Stretch Limitations standing with foot on chair.    Quad Stretch Right;2 reps;20 seconds   prone with strap   Hip Flexor Stretch Right;2 reps;20 seconds   hugging Lt knee, Rt leg off of table   Knee: Self-Stretch to increase Flexion Right;4 reps;10 seconds;20 seconds   foot on chair, lunging forward.   Gastroc Stretch Right;2 reps;Left;1 rep;20 seconds      Knee/Hip Exercises: Aerobic   Recumbent Bike L1-2 x 6  min for warm up    Other Aerobic laps around clinic between exercises      Knee/Hip Exercises: Standing   Step Down Right;1 set;10 reps;Step Height: 4"   Lt hand on rail; retro step up RLE for TKE     Knee/Hip Exercises: Seated   Long Arc Quad Right;1 set;5 reps    Other Seated Knee/Hip Exercises seated on elevated table, AAROM (overpressure) into Rt knee flexion and extensionx 10 sec x 3 reps    Other Seated Knee/Hip Exercises seated scoot x 4 reps                 PT Long Term Goals - 06/01/21 1052       PT LONG TERM GOAL #1   Title The patient will be indep with HEP for R LE strengthening, general mobility, stretching.    Time 6    Period Weeks    Status On-going    Target Date 06/12/21      PT LONG TERM GOAL #2   Title The patient will improve R knee AROM to 105 degrees flexion.    Baseline 100: 05/09/21    Time 6    Period Weeks    Status On-going    Target Date 06/12/21      PT LONG TERM GOAL #3   Title The patient will demonstrate LAQ with < 15 degrees extensor lag.     Baseline -18 degrees seated LAQ:05/09/21    Time 6    Period Weeks    Status On-going    Target Date 06/12/21      PT LONG TERM GOAL #4   Title The patient will improve gait to ambulate no device x 500 feet mod indep.    Baseline ambulating with SPC    Time 6    Period Weeks    Status Achieved    Target Date 06/12/21      PT LONG TERM GOAL #5   Title The patient will improve gait speed to > or equal to 2.6 ft/sec for community ambulation gait speed.    Baseline 1.76 ft/sec:  05/09/21    Time 6    Period Weeks    Status On-going    Target Date 06/12/21      PT LONG TERM GOAL #6   Title The patient will negotiate stairs in home to access both levels mod indep with one handrail.    Time 6    Period Weeks    Status Achieved    Target Date 06/12/21      PT LONG TERM GOAL #7   Title Improve FOTO to 66%.    Time 6    Period Weeks    Status On-going    Target Date 06/12/21                   Plan - 06/01/21 1306     Clinical Impression Statement Pt demonstrated improved Rt knee ROM.  He responded well to ball release for Rt hip (in prone) and thomas stretch afterwards; reported less pain in back with short gait afterwards.  Pt making gradually progress towards remaining goals.    PT Frequency 2x / week    PT Duration 6 weeks    PT Treatment/Interventions ADLs/Self Care Home Management;Patient/family education;Gait training;Stair training;Functional mobility training;Therapeutic activities;Aquatic Therapy;Cryotherapy;Vasopneumatic Device;Therapeutic exercise;Balance training;Neuromuscular re-education;Passive range of motion;Scar mobilization;Moist Heat;Electrical Stimulation;Taping;Manual techniques;Dry needling    PT Next Visit Plan assess goals, end of POC.  (NO VASO -  not covered)    PT Home Exercise Plan [id number]    Consulted and Agree with Plan of Care Patient             Patient will benefit from skilled therapeutic intervention in order to improve the  following deficits and impairments:  Abnormal gait, Decreased range of motion, Increased fascial restricitons, Impaired tone, Pain, Decreased balance, Decreased scar mobility, Hypomobility, Impaired flexibility, Decreased strength, Increased edema  Visit Diagnosis: Acute pain of right knee  Muscle weakness (generalized)  Other abnormalities of gait and mobility     Problem List Patient Active Problem List   Diagnosis Date Noted   Arthritis of right knee    S/P total knee replacement, right 04/10/2021   DJD (degenerative joint disease) of knee 03/13/2015   Kerin Perna, PTA 06/01/21 1:12 PM  [facility] [place], [place], [address] Phone: [phone]   Fax:  [phone]  Name: Kevin Patterson MRN: [id number] Date of Birth: May 27, 1955

## 2021-06-05 ENCOUNTER — Other Ambulatory Visit: Payer: Self-pay

## 2021-06-05 ENCOUNTER — Ambulatory Visit: Payer: 59 | Admitting: Physical Therapy

## 2021-06-05 ENCOUNTER — Encounter: Payer: Self-pay | Admitting: Physical Therapy

## 2021-06-05 DIAGNOSIS — M25561 Pain in right knee: Secondary | ICD-10-CM

## 2021-06-05 DIAGNOSIS — M6281 Muscle weakness (generalized): Secondary | ICD-10-CM

## 2021-06-05 DIAGNOSIS — R2689 Other abnormalities of gait and mobility: Secondary | ICD-10-CM

## 2021-06-05 NOTE — Therapy (Signed)
[place], [place], [address] Phone: [phone]   Fax:  [phone]  Physical Therapy Treatment and Recertificiation  Patient Details  Name: Kevin Patterson MRN: [id number] Date of Birth: 08/27/1955 Referring Provider (PT): Marcene Duos, MD   Encounter Date: 06/05/2021   PT End of Session - 06/05/21 1059     Visit Number 13    Number of Visits 25    Date for PT Re-Evaluation 07/17/21    Authorization Type aetna--60 visits/year $40 copay    PT Start Time 1021    PT Stop Time 1100    PT Time Calculation (min) 39 min    Activity Tolerance Patient tolerated treatment well    Behavior During Therapy [facility] for tasks assessed/performed             Past Medical History:  Diagnosis Date   Arthritis    Cyst of kidney, acquired    history of , monitored   Hypertension     Past Surgical History:  Procedure Laterality Date   APPENDECTOMY     BACK SURGERY     JOINT REPLACEMENT     left knee   KNEE ARTHROTOMY  12/28/1968   open cleanout   TONSILLECTOMY     TOTAL KNEE ARTHROPLASTY Left 03/13/2015   Procedure: LEFT TOTAL KNEE ARTHROPLASTY;  Surgeon: Elsie Saas, MD;  Location: [place];  Service: Orthopedics;  Laterality: Left;   TOTAL KNEE ARTHROPLASTY Right 04/10/2021   Procedure: RIGHT TOTAL KNEE ARTHROPLASTY;  Surgeon: Meredith Pel, MD;  Location: [place];  Service: Orthopedics;  Laterality: Right;    There were no vitals filed for this visit.   Subjective Assessment - 06/05/21 1025     Subjective Pt reports he found a ball and has been using it on his groin; it has helped.  He slept well last night; the back and hip flare up that started 2 wks ago are finally calming down.  He reports his exercises are going well.  He used his 0 pillow (for knee ext) for 30 min yesterday, 2x.    Patient Stated Goals I want to be walking and get back to work.    Currently in Pain? No/denies    Pain  Score 0-No pain                [facility] PT Assessment - 06/05/21 0001       Assessment   Medical Diagnosis R TKR    Referring Provider (PT) Marcene Duos, MD    Onset Date/Surgical Date 04/10/21    Hand Dominance Right    Next MD Visit 06/25/21      AROM   Right Knee Extension -18   LAQ     PROM   Right Knee Extension -4    Right Knee Flexion 112               [facility] Adult PT Treatment/Exercise - 06/05/21 0001       Knee/Hip Exercises: Stretches   Passive Hamstring Stretch Right;2 reps;Left;1 rep;30 seconds   seated with hip hinge   Quad Stretch --    Hip Flexor Stretch Right;2 reps;20 seconds   supine, thomas position hugging Lt knee   Hip Flexor Stretch Limitations Rt / Lt seated hip flexor stretch in chair x 30 sec x 2   Gastroc Stretch Right;20 seconds;3 reps;Left;1 rep      Knee/Hip Exercises: Aerobic   Tread Mill 1.6 mph, 6 grade, x 2  min, 1.65mh 0 grade 130m; 2 min of 1.5 mph grade 7; 1.8 mph x 1 min 0 grade    Recumbent Bike L1-2 x 7 min for warm up    Other Aerobic laps around clinic between exercises      Knee/Hip Exercises: Standing   Heel Raises Both;1 set;20 reps    Lateral Step Up Right;1 set;10 reps;Step Height: 6"    Step Down Right;1 set;10 reps;Step Height: 4"   Lt hand on rail; retro step up RLE for TKE   SLS Rt SLS (solid surface) x 30 sec with intermittent UE to steady x 2                  PT Long Term Goals - 06/05/21 1140       PT LONG TERM GOAL #1   Title The patient will be indep with HEP for R LE strengthening, general mobility, stretching.    Time 6    Period Weeks    Status On-going    Target Date 07/17/21      PT LONG TERM GOAL #2   Title The patient will improve R knee AROM to 105 degrees flexion.    Time 6    Period Weeks    Status Achieved    Target Date 07/17/21      PT LONG TERM GOAL #3   Title The patient will demonstrate LAQ with < 15 degrees extensor lag.    Baseline -18 degrees seated LAQ    Time 6     Period Weeks    Status Partially Met    Target Date 07/17/21      PT LONG TERM GOAL #4   Title The patient will improve gait to ambulate no device x 500 feet mod indep.    Time 6    Period Weeks    Status Achieved    Target Date 07/17/21      PT LONG TERM GOAL #5   Title The patient will improve gait speed to > or equal to 2.6 ft/sec for community ambulation gait speed.    Baseline 1.76 ft/sec:  05/09/21    Time 6    Period Weeks    Status On-going    Target Date 07/17/21      PT LONG TERM GOAL #6   Title The patient will negotiate stairs in home to access both levels mod indep with one handrail.    Time 6    Period Weeks    Status Achieved    Target Date 06/12/21      PT LONG TERM GOAL #7   Title Improve FOTO to 66%.    Time 6    Period Weeks    Status On-going    Target Date 06/12/21                   Plan - 06/05/21 1057     Clinical Impression Statement Pt's Rt knee ROM gradually progressing.  He continues to lack terminal knee ext but has met his flexion goal.  He tolerated circuit of increased grade on treadmill well, without increase symptoms.  Frequent stretches for LE during session ease his complaint of stiffness in knee, hip, back.  Pt demonstrates antalgic gait, and functional weakness during stairs. Pt has partially met his goals and will benefit from continued PT intervention to maximize functional mobility with less pain.    PT Frequency 2x / week    PT Duration 6 weeks  PT Treatment/Interventions ADLs/Self Care Home Management;Patient/family education;Gait training;Stair training;Functional mobility training;Therapeutic activities;Aquatic Therapy;Cryotherapy;Vasopneumatic Device;Therapeutic exercise;Balance training;Neuromuscular re-education;Passive range of motion;Scar mobilization;Moist Heat;Electrical Stimulation;Taping;Manual techniques;Dry needling    PT Next Visit Plan continue progressive strengthening and ROM for Rt knee.  work on functional  strengthening to assist with return to work  (NO VASO - not covered)    [place] and Agree with Plan of Care Patient             Patient will benefit from skilled therapeutic intervention in order to improve the following deficits and impairments:  Abnormal gait, Decreased range of motion, Increased fascial restricitons, Impaired tone, Pain, Decreased balance, Decreased scar mobility, Hypomobility, Impaired flexibility, Decreased strength, Increased edema  Visit Diagnosis: Acute pain of right knee - Plan: PT plan of care cert/re-cert  Muscle weakness (generalized) - Plan: PT plan of care cert/re-cert  Other abnormalities of gait and mobility - Plan: PT plan of care cert/re-cert     Problem List Patient Active Problem List   Diagnosis Date Noted   Arthritis of right knee    S/P total knee replacement, right 04/10/2021   DJD (degenerative joint disease) of knee 03/13/2015   Kerin Perna, PTA 06/05/21 11:53 AM [place] [place], [place], [address] Phone: [phone]   Fax:  [phone]  Name: Tex Conroy MRN: [id number] Date of Birth: 1956-01-01

## 2021-06-08 ENCOUNTER — Ambulatory Visit: Payer: 59 | Admitting: Physical Therapy

## 2021-06-08 ENCOUNTER — Other Ambulatory Visit: Payer: Self-pay

## 2021-06-08 ENCOUNTER — Encounter: Payer: Self-pay | Admitting: Physical Therapy

## 2021-06-08 DIAGNOSIS — R2689 Other abnormalities of gait and mobility: Secondary | ICD-10-CM

## 2021-06-08 DIAGNOSIS — M25561 Pain in right knee: Secondary | ICD-10-CM | POA: Diagnosis not present

## 2021-06-08 DIAGNOSIS — M6281 Muscle weakness (generalized): Secondary | ICD-10-CM

## 2021-06-08 NOTE — Therapy (Signed)
[place], [place], [address] Phone: [phone]   Fax:  [phone]  Physical Therapy Treatment  Patient Details  Name: Kevin Patterson MRN: [id number] Date of Birth: 1956/02/24 Referring Provider (PT): Marcene Duos, MD   Encounter Date: 06/08/2021   PT End of Session - 06/08/21 1023     Visit Number 14    Number of Visits 25    Date for PT Re-Evaluation 07/17/21    Authorization Type aetna--60 visits/year $40 copay    PT Start Time 1018    PT Stop Time 1056    PT Time Calculation (min) 38 min    Activity Tolerance Patient tolerated treatment well    Behavior During Therapy [facility] for tasks assessed/performed             Past Medical History:  Diagnosis Date   Arthritis    Cyst of kidney, acquired    history of , monitored   Hypertension     Past Surgical History:  Procedure Laterality Date   APPENDECTOMY     BACK SURGERY     JOINT REPLACEMENT     left knee   KNEE ARTHROTOMY  12/28/1968   open cleanout   TONSILLECTOMY     TOTAL KNEE ARTHROPLASTY Left 03/13/2015   Procedure: LEFT TOTAL KNEE ARTHROPLASTY;  Surgeon: Elsie Saas, MD;  Location: [place];  Service: Orthopedics;  Laterality: Left;   TOTAL KNEE ARTHROPLASTY Right 04/10/2021   Procedure: RIGHT TOTAL KNEE ARTHROPLASTY;  Surgeon: Meredith Pel, MD;  Location: [place];  Service: Orthopedics;  Laterality: Right;    There were no vitals filed for this visit.   Subjective Assessment - 06/08/21 1023     Subjective Pt reports he has been riding his upright stationary bicycle about 30 min, but afterwards he is noticing increased swelling.  "I thought I'd be over that by now".  No new changes since last visit.    Patient Stated Goals I want to be walking and get back to work.    Currently in Pain? No/denies    Pain Score 0-No pain                [facility] PT Assessment - 06/08/21 0001       Assessment   Medical  Diagnosis R TKR    Referring Provider (PT) Marcene Duos, MD    Onset Date/Surgical Date 04/10/21    Hand Dominance Right    Next MD Visit 06/25/21      PROM   Right Knee Flexion 112               [facility] Adult PT Treatment/Exercise - 06/08/21 0001       Knee/Hip Exercises: Stretches   Passive Hamstring Stretch Right;Left;2 reps;30 seconds   standing with LE on 12" on step   Hip Flexor Stretch Right;Left;2 reps;30 seconds    Hip Flexor Stretch Limitations Rt / Lt seated hip flexor stretch in chair; with/without arm up overhead    Knee: Self-Stretch to increase Flexion Right;4 reps;10 seconds   foot on 12" step   Gastroc Stretch Right;Left;2 reps;30 seconds   heel off  step     Knee/Hip Exercises: Aerobic   Tread Mill 1.7-2.0 mph - with grade 0-8 x 5 min    Recumbent Bike L2: 5 min for warm up    Other Aerobic laps around clinic between exercises      Knee/Hip Exercises: Standing   Forward Step Up Right;Left;1  set;10 reps;Hand Hold: 1;Step Height: 8"   with high knee lift of opp leg   SLS Rt SLS (solid surface) x 15 sec with intermittent UE to steady x 2    Other Standing Knee Exercises standing to half kneeling on pad with UE support on table x 1, with UE on Rt thigh x 1    Other Standing Knee Exercises sidestep red TB on thigh,R/L x 20 ft.  Monter walk with red band on thighs - forward / backwards 10 ft x 2                          PT Long Term Goals - 06/05/21 1140       PT LONG TERM GOAL #1   Title The patient will be indep with HEP for R LE strengthening, general mobility, stretching.    Time 6    Period Weeks    Status On-going    Target Date 07/17/21      PT LONG TERM GOAL #2   Title The patient will improve R knee AROM to 105 degrees flexion.    Time 6    Period Weeks    Status Achieved    Target Date 07/17/21      PT LONG TERM GOAL #3   Title The patient will demonstrate LAQ with < 15 degrees extensor lag.    Baseline -18 degrees seated LAQ     Time 6    Period Weeks    Status Partially Met    Target Date 07/17/21      PT LONG TERM GOAL #4   Title The patient will improve gait to ambulate no device x 500 feet mod indep.    Time 6    Period Weeks    Status Achieved    Target Date 07/17/21      PT LONG TERM GOAL #5   Title The patient will improve gait speed to > or equal to 2.6 ft/sec for community ambulation gait speed.    Baseline 1.76 ft/sec:  05/09/21    Time 6    Period Weeks    Status On-going    Target Date 07/17/21      PT LONG TERM GOAL #6   Title The patient will negotiate stairs in home to access both levels mod indep with one handrail.    Time 6    Period Weeks    Status Achieved    Target Date 06/12/21      PT LONG TERM GOAL #7   Title Improve FOTO to 66%.    Time 6    Period Weeks    Status On-going    Target Date 06/12/21                   Plan - 06/08/21 1057     Clinical Impression Statement Pt's Rt knee flexion remains similar to last assessment.  He tolerated increased grade and speed on treadmill without increase in knee or back pain.  Seated hip flexor stretch has worked well to loosen back/hip/knee.  Pt demonstrated difficulty completing standing to/from half kneeling, but was able to complete 2 reps.  Will plan to continue functional strengthening and ROM to prepare pt for return to work.    PT Frequency 2x / week    PT Duration 6 weeks    PT Treatment/Interventions ADLs/Self Care Home Management;Patient/family education;Gait training;Stair training;Functional mobility training;Therapeutic activities;Aquatic Therapy;Cryotherapy;Vasopneumatic Device;Therapeutic exercise;Balance training;Neuromuscular re-education;Passive range  of motion;Scar mobilization;Moist Heat;Electrical Stimulation;Taping;Manual techniques;Dry needling    PT Next Visit Plan continue progressive strengthening and ROM for Rt knee.  work on functional strengthening to assist with return to work  (NO VASO - not  covered)    [place] and Agree with Plan of Care Patient             Patient will benefit from skilled therapeutic intervention in order to improve the following deficits and impairments:  Abnormal gait, Decreased range of motion, Increased fascial restricitons, Impaired tone, Pain, Decreased balance, Decreased scar mobility, Hypomobility, Impaired flexibility, Decreased strength, Increased edema  Visit Diagnosis: No diagnosis found.     Problem List Patient Active Problem List   Diagnosis Date Noted   Arthritis of right knee    S/P total knee replacement, right 04/10/2021   DJD (degenerative joint disease) of knee 03/13/2015   Kerin Perna, PTA 06/08/21 12:55 PM [place], [place], [address] Phone: [phone]   Fax:  [phone]  Name: Kevin Patterson MRN: [id number] Date of Birth: [birth year]

## 2021-06-12 ENCOUNTER — Encounter: Payer: Self-pay | Admitting: Physical Therapy

## 2021-06-12 ENCOUNTER — Other Ambulatory Visit: Payer: Self-pay

## 2021-06-12 ENCOUNTER — Ambulatory Visit: Payer: 59 | Admitting: Physical Therapy

## 2021-06-12 DIAGNOSIS — M25561 Pain in right knee: Secondary | ICD-10-CM

## 2021-06-12 DIAGNOSIS — R600 Localized edema: Secondary | ICD-10-CM

## 2021-06-12 DIAGNOSIS — M6281 Muscle weakness (generalized): Secondary | ICD-10-CM

## 2021-06-12 DIAGNOSIS — R2689 Other abnormalities of gait and mobility: Secondary | ICD-10-CM

## 2021-06-12 NOTE — Therapy (Signed)
[place], [place], [address] Phone: [phone]   Fax:  [phone]  Physical Therapy Treatment  Patient Details  Name: Kevin Patterson MRN: [id number] Date of Birth: 09-07-1955 Referring Provider (PT): Marcene Duos, MD   Encounter Date: 06/12/2021   PT End of Session - 06/12/21 1100     Visit Number 15    Number of Visits 25    Date for PT Re-Evaluation 07/17/21    Authorization Type aetna--60 visits/year $40 copay    PT Start Time 1015    PT Stop Time 1058    PT Time Calculation (min) 43 min    Activity Tolerance Patient tolerated treatment well    Behavior During Therapy [facility] for tasks assessed/performed             Past Medical History:  Diagnosis Date   Arthritis    Cyst of kidney, acquired    history of , monitored   Hypertension     Past Surgical History:  Procedure Laterality Date   APPENDECTOMY     BACK SURGERY     JOINT REPLACEMENT     left knee   KNEE ARTHROTOMY  12/28/1968   open cleanout   TONSILLECTOMY     TOTAL KNEE ARTHROPLASTY Left 03/13/2015   Procedure: LEFT TOTAL KNEE ARTHROPLASTY;  Surgeon: Elsie Saas, MD;  Location: [place];  Service: Orthopedics;  Laterality: Left;   TOTAL KNEE ARTHROPLASTY Right 04/10/2021   Procedure: RIGHT TOTAL KNEE ARTHROPLASTY;  Surgeon: Meredith Pel, MD;  Location: [place];  Service: Orthopedics;  Laterality: Right;    There were no vitals filed for this visit.   Subjective Assessment - 06/12/21 1021     Subjective Pt reports his Rt knee hurts when he is moving in the bed, and when transitioning out of bed after sleeping. He has been icing and elevating; he notices the swelling is better.    Patient Stated Goals I want to be walking and get back to work.    Currently in Pain? Yes    Pain Score 2     Pain Location Knee    Pain Orientation Right;Medial    Pain Descriptors / Indicators Aching    Aggravating Factors   see above                [facility] PT Assessment - 06/12/21 0001       Assessment   Medical Diagnosis R TKR    Referring Provider (PT) Marcene Duos, MD    Onset Date/Surgical Date 04/10/21    Hand Dominance Right    Next MD Visit 06/25/21              [facility] Adult PT Treatment/Exercise - 06/12/21 0001       Knee/Hip Exercises: Stretches   Passive Hamstring Stretch Right;2 reps;20 seconds   supine with strap   Quad Stretch Right;3 reps;30 seconds   prone, pillow under hips; noodle above knee   Gastroc Stretch Both;2 reps;30 seconds   incline board.   Other Knee/Hip Stretches standing adductor stretch R/L x 20 sec x 2      Knee/Hip Exercises: Aerobic   Recumbent Bike L2-3:  5 min    Other Aerobic laps around clinic between exercises      Knee/Hip Exercises: Standing   Lateral Step Up Right;Left;1 set;10 reps;Hand Hold: 1;Step Height: 8"    Forward Step Up Right;Left;1 set;10 reps;Hand Hold: 1;Step Height: 8"   with high knee  lift of opp leg   SLS Rt SLS (solid surface) x  5 sec without support.    Other Standing Knee Exercises standing to half kneeling on 3" pad with UE support on table x 4 (challenging)    Other Standing Knee Exercises tandem stance with RLE back, with horiz head turns, intermittent UE to steady. partial split squats x 10 each leg forward with UE on rail      Knee/Hip Exercises: Seated   Sit to Sand 1 set;10 reps;without UE support   staggered stance, RLE back     [employer]   Edema I strips of sensitive skin rock tape applied in X pattern over Rt medial joint line to decompress tissue and increase proprioception.                          PT Long Term Goals - 06/05/21 1140       PT LONG TERM GOAL #1   Title The patient will be indep with HEP for R LE strengthening, general mobility, stretching.    Time 6    Period Weeks    Status On-going    Target Date 07/17/21      PT LONG TERM GOAL #2   Title The patient will improve R knee  AROM to 105 degrees flexion.    Time 6    Period Weeks    Status Achieved    Target Date 07/17/21      PT LONG TERM GOAL #3   Title The patient will demonstrate LAQ with < 15 degrees extensor lag.    Baseline -18 degrees seated LAQ    Time 6    Period Weeks    Status Partially Met    Target Date 07/17/21      PT LONG TERM GOAL #4   Title The patient will improve gait to ambulate no device x 500 feet mod indep.    Time 6    Period Weeks    Status Achieved    Target Date 07/17/21      PT LONG TERM GOAL #5   Title The patient will improve gait speed to > or equal to 2.6 ft/sec for community ambulation gait speed.    Baseline 1.76 ft/sec:  05/09/21    Time 6    Period Weeks    Status On-going    Target Date 07/17/21      PT LONG TERM GOAL #6   Title The patient will negotiate stairs in home to access both levels mod indep with one handrail.    Time 6    Period Weeks    Status Achieved    Target Date 06/12/21      PT LONG TERM GOAL #7   Title Improve FOTO to 66%.    Time 6    Period Weeks    Status On-going    Target Date 06/12/21                   Plan - 06/12/21 1254     Clinical Impression Statement Pt challenged with Rt SLS and completing with partial split squats. Continued focus on functional strengthening and ROM to assist with return to work in 1 month.  He reported reduction of Rt knee pain with exercises.  Progressing well towards remaining goals.    PT Frequency 2x / week    PT Duration 6 weeks    PT Treatment/Interventions ADLs/Self Care Home Management;Patient/family education;Gait training;Stair  training;Functional mobility training;Therapeutic activities;Aquatic Therapy;Cryotherapy;Vasopneumatic Device;Therapeutic exercise;Balance training;Neuromuscular re-education;Passive range of motion;Scar mobilization;Moist Heat;Electrical Stimulation;Taping;Manual techniques;Dry needling    PT Next Visit Plan Ck gait speed and ROM. work on functional  strengthening to assist with return to work  (NO VASO - not covered)    [place] and Agree with Plan of Care Patient             Patient will benefit from skilled therapeutic intervention in order to improve the following deficits and impairments:  Abnormal gait, Decreased range of motion, Increased fascial restricitons, Impaired tone, Pain, Decreased balance, Decreased scar mobility, Hypomobility, Impaired flexibility, Decreased strength, Increased edema  Visit Diagnosis: Acute pain of right knee  Muscle weakness (generalized)  Other abnormalities of gait and mobility  Localized edema     Problem List Patient Active Problem List   Diagnosis Date Noted   Arthritis of right knee    S/P total knee replacement, right 04/10/2021   DJD (degenerative joint disease) of knee 03/13/2015   Kerin Perna, PTA 06/12/21 1:01 PM  [facility] [place], [place], [address] Phone: [phone]   Fax:  [phone]  Name: Kevin Patterson MRN: [id number] Date of Birth: [birth year]

## 2021-06-15 ENCOUNTER — Other Ambulatory Visit: Payer: Self-pay

## 2021-06-15 ENCOUNTER — Ambulatory Visit: Payer: 59 | Admitting: Physical Therapy

## 2021-06-15 DIAGNOSIS — M25561 Pain in right knee: Secondary | ICD-10-CM | POA: Diagnosis not present

## 2021-06-15 DIAGNOSIS — R600 Localized edema: Secondary | ICD-10-CM

## 2021-06-15 DIAGNOSIS — R2689 Other abnormalities of gait and mobility: Secondary | ICD-10-CM

## 2021-06-15 DIAGNOSIS — M6281 Muscle weakness (generalized): Secondary | ICD-10-CM

## 2021-06-15 NOTE — Therapy (Signed)
[place], [place], [address] Phone: [phone]   Fax:  [phone]  Physical Therapy Treatment  Patient Details  Name: Kevin Patterson MRN: [id number] Date of Birth: [birth year] Referring Provider (PT): Marcene Duos, MD   Encounter Date: 06/15/2021   PT End of Session - 06/15/21 1055     Visit Number 16    Number of Visits 25    Date for PT Re-Evaluation 07/17/21    Authorization Type aetna--60 visits/year $40 copay    PT Start Time 1015    PT Stop Time 1055    PT Time Calculation (min) 40 min    Activity Tolerance Patient tolerated treatment well    Behavior During Therapy [facility] for tasks assessed/performed             Past Medical History:  Diagnosis Date   Arthritis    Cyst of kidney, acquired    history of , monitored   Hypertension     Past Surgical History:  Procedure Laterality Date   APPENDECTOMY     BACK SURGERY     JOINT REPLACEMENT     left knee   KNEE ARTHROTOMY  12/28/1968   open cleanout   TONSILLECTOMY     TOTAL KNEE ARTHROPLASTY Left 03/13/2015   Procedure: LEFT TOTAL KNEE ARTHROPLASTY;  Surgeon: Elsie Saas, MD;  Location: [place];  Service: Orthopedics;  Laterality: Left;   TOTAL KNEE ARTHROPLASTY Right 04/10/2021   Procedure: RIGHT TOTAL KNEE ARTHROPLASTY;  Surgeon: Meredith Pel, MD;  Location: [place];  Service: Orthopedics;  Laterality: Right;    There were no vitals filed for this visit.   Subjective Assessment - 06/15/21 1018     Subjective Pt states he is still sore, but feels better today    Patient Stated Goals I want to be walking and get back to work.    Currently in Pain? Yes    Pain Score 2     Pain Location Knee    Pain Orientation Right                [facility] PT Assessment - 06/15/21 0001       Assessment   Medical Diagnosis R TKR    Referring Provider (PT) Marcene Duos, MD    Onset Date/Surgical Date 04/10/21    Hand  Dominance Right    Next MD Visit 06/25/21      AROM   Right Knee Flexion 112   prone                          [facility] Adult PT Treatment/Exercise - 06/15/21 0001       Knee/Hip Exercises: Stretches   Passive Hamstring Stretch Right;2 reps;20 seconds   supine with strap   Quad Stretch Right;3 reps;30 seconds   prone, pillow under hips; noodle above knee   Gastroc Stretch Both;2 reps;30 seconds    Other Knee/Hip Stretches standing adductor stretch R/L x 20 sec x 2      Knee/Hip Exercises: Aerobic   Tread Mill downhill walking 13-15% grade x 4 min 1.98mph    Recumbent Bike L2 x 5 min    Other Aerobic laps around clinic between exercises      Knee/Hip Exercises: Standing   Lateral Step Up Right;Left;2 sets;Hand Hold: 1;10 reps;Step Height: 8"    Lateral Step Up Limitations straddle step    Forward Step Up Right;2 sets;Hand Hold: 1;10 reps;Step  Height: 8";Left    SLS Rt SLS (solid surface) x  5 sec without support.    Other Standing Knee Exercises standing to half kneeling on foam pad x 3    Other Standing Knee Exercises tandem stance with RLE back, with horiz head turns, intermittent UE to steady. partial split squats x 10 each leg forward with UE on rail      Knee/Hip Exercises: Seated   Sit to Sand 2 sets;10 reps   holding 15# KB                         PT Long Term Goals - 06/05/21 1140       PT LONG TERM GOAL #1   Title The patient will be indep with HEP for R LE strengthening, general mobility, stretching.    Time 6    Period Weeks    Status On-going    Target Date 07/17/21      PT LONG TERM GOAL #2   Title The patient will improve R knee AROM to 105 degrees flexion.    Time 6    Period Weeks    Status Achieved    Target Date 07/17/21      PT LONG TERM GOAL #3   Title The patient will demonstrate LAQ with < 15 degrees extensor lag.    Baseline -18 degrees seated LAQ    Time 6    Period Weeks    Status Partially Met    Target Date  07/17/21      PT LONG TERM GOAL #4   Title The patient will improve gait to ambulate no device x 500 feet mod indep.    Time 6    Period Weeks    Status Achieved    Target Date 07/17/21      PT LONG TERM GOAL #5   Title The patient will improve gait speed to > or equal to 2.6 ft/sec for community ambulation gait speed.    Baseline 1.76 ft/sec:  05/09/21    Time 6    Period Weeks    Status On-going    Target Date 07/17/21      PT LONG TERM GOAL #6   Title The patient will negotiate stairs in home to access both levels mod indep with one handrail.    Time 6    Period Weeks    Status Achieved    Target Date 06/12/21      PT LONG TERM GOAL #7   Title Improve FOTO to 66%.    Time 6    Period Weeks    Status On-going    Target Date 06/12/21                   Plan - 06/15/21 1055     Clinical Impression Statement Pt with improving tolerance to gait on uneven surfaces and improved gait mechanics on level surface. Improving Rt knee ROM. Progressing well towards goals    PT Next Visit Plan continue progressive strengthening and ROM for Rt knee.  work on functional strengthening to assist with return to work  (NO VASO - not covered)    [place] and Agree with Plan of Care Patient             Patient will benefit from skilled therapeutic intervention in order to improve the following deficits and impairments:     Visit Diagnosis: Acute  pain of right knee  Muscle weakness (generalized)  Other abnormalities of gait and mobility  Localized edema     Problem List Patient Active Problem List   Diagnosis Date Noted   Arthritis of right knee    S/P total knee replacement, right 04/10/2021   DJD (degenerative joint disease) of knee 03/13/2015    Lorynn Moeser, PT 06/15/2021, 10:56 AM  [facility] [place], [place], [address] Phone: [phone]   Fax:   [phone]  Name: Kevin Patterson MRN: [id number] Date of Birth: 04-20-1956

## 2021-06-19 ENCOUNTER — Other Ambulatory Visit: Payer: Self-pay

## 2021-06-19 ENCOUNTER — Ambulatory Visit: Payer: 59 | Admitting: Physical Therapy

## 2021-06-19 DIAGNOSIS — R2689 Other abnormalities of gait and mobility: Secondary | ICD-10-CM

## 2021-06-19 DIAGNOSIS — M25561 Pain in right knee: Secondary | ICD-10-CM | POA: Diagnosis not present

## 2021-06-19 DIAGNOSIS — R600 Localized edema: Secondary | ICD-10-CM

## 2021-06-19 DIAGNOSIS — M6281 Muscle weakness (generalized): Secondary | ICD-10-CM

## 2021-06-19 NOTE — Therapy (Signed)
[place], [place], [address] Phone: [phone]   Fax:  [phone]  Physical Therapy Treatment  Patient Details  Name: Kevin Patterson MRN: [id number] Date of Birth: [birth year] Referring Provider (PT): Marcene Duos, MD   Encounter Date: 06/19/2021   PT End of Session - 06/19/21 1112     Visit Number 17    Number of Visits 25    Date for PT Re-Evaluation 07/17/21    Authorization Type aetna--60 visits/year $40 copay    PT Start Time 1015    PT Stop Time 1055    PT Time Calculation (min) 40 min    Activity Tolerance Patient tolerated treatment well    Behavior During Therapy [facility] for tasks assessed/performed             Past Medical History:  Diagnosis Date   Arthritis    Cyst of kidney, acquired    history of , monitored   Hypertension     Past Surgical History:  Procedure Laterality Date   APPENDECTOMY     BACK SURGERY     JOINT REPLACEMENT     left knee   KNEE ARTHROTOMY  12/28/1968   open cleanout   TONSILLECTOMY     TOTAL KNEE ARTHROPLASTY Left 03/13/2015   Procedure: LEFT TOTAL KNEE ARTHROPLASTY;  Surgeon: Elsie Saas, MD;  Location: [place];  Service: Orthopedics;  Laterality: Left;   TOTAL KNEE ARTHROPLASTY Right 04/10/2021   Procedure: RIGHT TOTAL KNEE ARTHROPLASTY;  Surgeon: Meredith Pel, MD;  Location: [place];  Service: Orthopedics;  Laterality: Right;    There were no vitals filed for this visit.   Subjective Assessment - 06/19/21 1018     Subjective Pt states he has some pain in his medial knee that hurts when he moves his leg around in bed    Patient Stated Goals I want to be walking and get back to work.    Currently in Pain? No/denies                [facility] PT Assessment - 06/19/21 0001       Assessment   Medical Diagnosis R TKR    Referring Provider (PT) Marcene Duos, MD    Onset Date/Surgical Date 04/10/21    Hand Dominance Right     Next MD Visit 06/25/21                           [facility] Adult PT Treatment/Exercise - 06/19/21 0001       Ambulation/Gait   Gait Comments gait outdoors up/down incline, kneeling in grass - education on going up/down at an angle to reduce grade of incline      Knee/Hip Exercises: Stretches   Gastroc Stretch Both;2 reps;30 seconds      Knee/Hip Exercises: Aerobic   Recumbent Bike L3 x 5 min      Knee/Hip Exercises: Standing   Lateral Step Up Right;Left;2 sets;Hand Hold: 1;10 reps;Step Height: 8"    Lateral Step Up Limitations straddle step    Forward Step Up Right;2 sets;Hand Hold: 1;10 reps;Step Height: 8";Left    Wall Squat 2 sets;10 reps    Wall Squat Limitations with 15#KB with physioball behind back    Other Standing Knee Exercises partial split squats x 10 with 1 UE support    Other Standing Knee Exercises standing on bosu ball with head turns x 10  PT Long Term Goals - 06/05/21 1140       PT LONG TERM GOAL #1   Title The patient will be indep with HEP for R LE strengthening, general mobility, stretching.    Time 6    Period Weeks    Status On-going    Target Date 07/17/21      PT LONG TERM GOAL #2   Title The patient will improve R knee AROM to 105 degrees flexion.    Time 6    Period Weeks    Status Achieved    Target Date 07/17/21      PT LONG TERM GOAL #3   Title The patient will demonstrate LAQ with < 15 degrees extensor lag.    Baseline -18 degrees seated LAQ    Time 6    Period Weeks    Status Partially Met    Target Date 07/17/21      PT LONG TERM GOAL #4   Title The patient will improve gait to ambulate no device x 500 feet mod indep.    Time 6    Period Weeks    Status Achieved    Target Date 07/17/21      PT LONG TERM GOAL #5   Title The patient will improve gait speed to > or equal to 2.6 ft/sec for community ambulation gait speed.    Baseline 1.76 ft/sec:  05/09/21    Time 6    Period  Weeks    Status On-going    Target Date 07/17/21      PT LONG TERM GOAL #6   Title The patient will negotiate stairs in home to access both levels mod indep with one handrail.    Time 6    Period Weeks    Status Achieved    Target Date 06/12/21      PT LONG TERM GOAL #7   Title Improve FOTO to 66%.    Time 6    Period Weeks    Status On-going    Target Date 06/12/21                   Plan - 06/19/21 1112     Clinical Impression Statement Pt continues to improve strenght and flexibility. Difficulty rising from kneeling on ground wihtout AD but pt states he will have walking sticks when at work.    PT Next Visit Plan continue progressive strengthening and ROM for Rt knee.  work on functional strengthening to assist with return to work  (NO VASO - not covered)    [place] and Agree with Plan of Care Patient             Patient will benefit from skilled therapeutic intervention in order to improve the following deficits and impairments:     Visit Diagnosis: Acute pain of right knee  Muscle weakness (generalized)  Other abnormalities of gait and mobility  Localized edema     Problem List Patient Active Problem List   Diagnosis Date Noted   Arthritis of right knee    S/P total knee replacement, right 04/10/2021   DJD (degenerative joint disease) of knee 03/13/2015    Eldred Sooy, PT 06/19/2021, [place] [address] [place], [place], [address] Phone: [phone]   Fax:  [phone]  Name: Kevin Patterson MRN: [id number] Date of Birth: 02/22/1956

## 2021-06-22 ENCOUNTER — Ambulatory Visit: Payer: 59 | Admitting: Physical Therapy

## 2021-06-22 ENCOUNTER — Other Ambulatory Visit: Payer: Self-pay

## 2021-06-22 DIAGNOSIS — R600 Localized edema: Secondary | ICD-10-CM

## 2021-06-22 DIAGNOSIS — M25561 Pain in right knee: Secondary | ICD-10-CM

## 2021-06-22 DIAGNOSIS — R2689 Other abnormalities of gait and mobility: Secondary | ICD-10-CM

## 2021-06-22 DIAGNOSIS — M6281 Muscle weakness (generalized): Secondary | ICD-10-CM

## 2021-06-22 NOTE — Therapy (Addendum)
[place], [place], [address] Phone: [phone]   Fax:  [phone]  Physical Therapy Treatment and Discharge  Patient Details  Name: Kevin Patterson MRN: [id number] Date of Birth: Sep 02, 1955 Referring Provider (PT): Marcene Duos, MD   Encounter Date: 06/22/2021   PT End of Session - 06/22/21 1107     Visit Number 18    Number of Visits 25    Date for PT Re-Evaluation 07/17/21    Authorization Type aetna--60 visits/year $40 copay    PT Start Time 1022    PT Stop Time 1100    PT Time Calculation (min) 38 min    Activity Tolerance Patient tolerated treatment well    Behavior During Therapy [facility] for tasks assessed/performed             Past Medical History:  Diagnosis Date   Arthritis    Cyst of kidney, acquired    history of , monitored   Hypertension     Past Surgical History:  Procedure Laterality Date   APPENDECTOMY     BACK SURGERY     JOINT REPLACEMENT     left knee   KNEE ARTHROTOMY  12/28/1968   open cleanout   TONSILLECTOMY     TOTAL KNEE ARTHROPLASTY Left 03/13/2015   Procedure: LEFT TOTAL KNEE ARTHROPLASTY;  Surgeon: Elsie Saas, MD;  Location: [place];  Service: Orthopedics;  Laterality: Left;   TOTAL KNEE ARTHROPLASTY Right 04/10/2021   Procedure: RIGHT TOTAL KNEE ARTHROPLASTY;  Surgeon: Meredith Pel, MD;  Location: [place];  Service: Orthopedics;  Laterality: Right;    There were no vitals filed for this visit.   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 He states "it was an event, but I was able to do it"    Patient Stated Goals I want to be walking and get back to work.    Currently in Pain? No/denies                [facility] PT Assessment - 06/22/21 0001       Assessment   Medical Diagnosis R TKR    Referring Provider (PT) Marcene Duos, MD    Onset Date/Surgical Date 04/10/21     Hand Dominance Right    Next MD Visit 06/25/21      Observation/Other Assessments   Focus on Therapeutic Outcomes (FOTO)  59      Ambulation/Gait   Gait velocity 3.2 ft/sec    Gait Comments treadmill gait training up/down incline x 10 minutes for endurance training                           [facility] Adult PT Treatment/Exercise - 06/22/21 0001       Knee/Hip Exercises: Stretches   Passive Hamstring Stretch Right;2 reps;30 seconds    Quad Stretch Right;3 reps;30 seconds    Other Knee/Hip Stretches standing adductor stretch 2 x 20 sec      Knee/Hip Exercises: Aerobic   Recumbent Bike L3 x 3 min for warm up      Knee/Hip Exercises: Standing   Other Standing Knee Exercises partial split squats x 10 with 1 UE support    Other Standing Knee Exercises standing on bosu ball with head turns x 10  PT Long Term Goals - 06/22/21 1027       PT LONG TERM GOAL #1   Title The patient will be indep with HEP for R LE strengthening, general mobility, stretching.    Status On-going    Target Date 07/17/21      PT LONG TERM GOAL #2   Title The patient will improve R knee AROM to 105 degrees flexion.    Status Achieved      PT LONG TERM GOAL #3   Title The patient will demonstrate LAQ with < 15 degrees extensor lag.    Baseline -15 on 06/22/21    Status Achieved      PT LONG TERM GOAL #4   Title The patient will improve gait to ambulate no device x 500 feet mod indep.    Status Achieved      PT LONG TERM GOAL #5   Title The patient will improve gait speed to > or equal to 2.6 ft/sec for community ambulation gait speed.    Baseline 3.2 ft/sec    Status Achieved      PT LONG TERM GOAL #6   Title The patient will negotiate stairs in home to access both levels mod indep with one handrail.    Status Achieved      PT LONG TERM GOAL #7   Title Improve FOTO to 66%.    Baseline 59 on 06/22/21    Status On-going    Target Date  07/17/21                   Plan - 06/22/21 1107     Clinical Impression Statement Pt is improving strength and endurance. He states he feels he is ready to return to work. he has met ROM goal. Continue to progress towrads remaining goals    PT Next Visit Plan how was MD visit, ready for discharge?    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 Patient goals were met. Patient is being discharged due to being pleased with the current functional level.  Isabelle Course, PT,DPT03/11/2309:29 AM   Isabelle Course, PT 06/22/2021, 11:20 AM  [facility] [place], [place], [address] Phone: [phone]   Fax:  [phone]  Name: Kevin Patterson MRN: [id number] Date of Birth: 1956/02/05

## 2021-06-25 ENCOUNTER — Ambulatory Visit (INDEPENDENT_AMBULATORY_CARE_PROVIDER_SITE_OTHER): Payer: 59 | Admitting: Orthopedic Surgery

## 2021-06-25 ENCOUNTER — Other Ambulatory Visit: Payer: Self-pay

## 2021-06-25 ENCOUNTER — Encounter: Payer: Self-pay | Admitting: Orthopedic Surgery

## 2021-06-25 DIAGNOSIS — Z96651 Presence of right artificial knee joint: Secondary | ICD-10-CM

## 2021-06-25 NOTE — Progress Notes (Signed)
° °  Post-Op Visit Note   Patient: Kevin Patterson           Date of Birth: 1955/06/14           MRN: [id number] Visit Date: 06/25/2021 PCP: Mina Marble, PA-C   Assessment & Plan:  Chief Complaint:  Chief Complaint  Patient presents with   Right Knee - Routine Post Op    right total knee arthroplasty on 04/10/2021   Visit Diagnoses: No diagnosis found.  Plan: Kevin Patterson is a 66 year old patient underwent right total knee replacement 04/10/2021.  Having some swelling.  Overall he is making progress.  Range of motion is 0-1 05.  Straight leg raise is intact.  Mild effusion present.  Good stability to varus and valgus stress.  No calf tenderness negative Homans.  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  Has some lateral pain at the end of the day.  Finished therapy last Friday.  He wants to return to work 07/09/2021.  Prescription for compression hose given.  He will follow-up with [place] as needed.  Overall he has done well from his knee replacement.  Follow-Up Instructions: Return if symptoms worsen or fail to improve.   Orders:  No orders of the defined types were placed in this encounter.  No orders of the defined types were placed in this encounter.   Imaging: No results found.  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 No pertinent family history.  Past Surgical History:  Procedure Laterality Date   APPENDECTOMY     BACK SURGERY     JOINT REPLACEMENT     left knee   KNEE ARTHROTOMY  12/28/1968   open cleanout   TONSILLECTOMY     TOTAL KNEE ARTHROPLASTY Left 03/13/2015   Procedure: LEFT TOTAL KNEE ARTHROPLASTY;  Surgeon: Elsie Saas, MD;  Location: [place];  Service: Orthopedics;   Laterality: Left;   TOTAL KNEE ARTHROPLASTY Right 04/10/2021   Procedure: RIGHT TOTAL KNEE ARTHROPLASTY;  Surgeon: Meredith Pel, MD;  Location: [place];  Service: Orthopedics;  Laterality: Right;   Social History   Occupational History   Not on file  Tobacco Use   Smoking status: Former    Types: Cigarettes    Quit date: 04/29/1973    Years since quitting: 48.1   Smokeless tobacco: Not on file  Substance and Sexual Activity   Alcohol use: Yes    Alcohol/week: 3.0 standard drinks    Types: 3 Glasses of wine per week   Drug use: Not on file   Sexual activity: Not on file

## 2021-06-26 ENCOUNTER — Ambulatory Visit: Payer: 59 | Admitting: Physical Therapy

## 2021-08-20 ENCOUNTER — Other Ambulatory Visit: Payer: Self-pay

## 2021-08-20 ENCOUNTER — Inpatient Hospital Stay: Payer: 59 | Attending: Hematology | Admitting: Hematology

## 2021-08-20 ENCOUNTER — Inpatient Hospital Stay: Payer: 59

## 2021-08-20 VITALS — BP 134/76 | HR 78 | Temp 98.4°F | Resp 20 | Wt 235.3 lb

## 2021-08-20 DIAGNOSIS — Z79899 Other long term (current) drug therapy: Secondary | ICD-10-CM | POA: Diagnosis not present

## 2021-08-20 DIAGNOSIS — C9110 Chronic lymphocytic leukemia of B-cell type not having achieved remission: Secondary | ICD-10-CM | POA: Diagnosis not present

## 2021-08-20 DIAGNOSIS — Z87891 Personal history of nicotine dependence: Secondary | ICD-10-CM | POA: Diagnosis not present

## 2021-08-20 LAB — CMP (CANCER CENTER ONLY)
ALT: 11 U/L (ref 0–44)
AST: 18 U/L (ref 15–41)
Albumin: 4.3 g/dL (ref 3.5–5.0)
Alkaline Phosphatase: 82 U/L (ref 38–126)
Anion gap: 6 (ref 5–15)
BUN: 13 mg/dL (ref 8–23)
CO2: 29 mmol/L (ref 22–32)
Calcium: 9.4 mg/dL (ref 8.9–10.3)
Chloride: 105 mmol/L (ref 98–111)
Creatinine: 1.02 mg/dL (ref 0.61–1.24)
GFR, Estimated: >60 mL/min (ref >60)
Glucose, Bld: 122 mg/dL — ABNORMAL HIGH (ref 70–99)
Potassium: 4 mmol/L (ref 3.5–5.1)
Sodium: 140 mmol/L (ref 135–145)
Total Bilirubin: 0.9 mg/dL (ref 0.3–1.2)
Total Protein: 7 g/dL (ref 6.5–8.1)

## 2021-08-20 LAB — CBC WITH DIFFERENTIAL/PLATELET
Abs Immature Granulocytes: 0 10*3/uL (ref 0.00–0.07)
Basophils Absolute: 0 10*3/uL (ref 0.0–0.1)
Basophils Relative: 0 %
Eosinophils Absolute: 0.3 10*3/uL (ref 0.0–0.5)
Eosinophils Relative: 1 %
HCT: 40 % (ref 39.0–52.0)
Hemoglobin: 12.6 g/dL — ABNORMAL LOW (ref 13.0–17.0)
Lymphocytes Relative: 37 %
Lymphs Abs: 10.6 10*3/uL — ABNORMAL HIGH (ref 0.7–4.0)
MCH: 28.4 pg (ref 26.0–34.0)
MCHC: 31.5 g/dL (ref 30.0–36.0)
MCV: 90.1 fL (ref 80.0–100.0)
Monocytes Absolute: 1.1 10*3/uL — ABNORMAL HIGH (ref 0.1–1.0)
Monocytes Relative: 4 %
Neutro Abs: 16.6 10*3/uL — ABNORMAL HIGH (ref 1.7–7.7)
Neutrophils Relative %: 58 %
Platelets: 190 10*3/uL (ref 150–400)
RBC: 4.44 MIL/uL (ref 4.22–5.81)
RDW: 13.7 % (ref 11.5–15.5)
WBC: 28.6 10*3/uL — ABNORMAL HIGH (ref 4.0–10.5)
nRBC: 0 % (ref 0.0–0.2)

## 2021-08-20 LAB — RETICULOCYTES
Immature Retic Fract: 20.4 % — ABNORMAL HIGH (ref 2.3–15.9)
RBC.: 4.38 MIL/uL (ref 4.22–5.81)
Retic Count, Absolute: 91.5 10*3/uL (ref 19.0–186.0)
Retic Ct Pct: 2.1 % (ref 0.4–3.1)

## 2021-08-20 LAB — LACTATE DEHYDROGENASE: LDH: 137 U/L (ref 98–192)

## 2021-08-21 ENCOUNTER — Telehealth: Payer: Self-pay | Admitting: Hematology

## 2021-08-21 NOTE — Telephone Encounter (Signed)
Left message with follow-up appointments per 4/24 los. ?

## 2021-08-23 NOTE — Progress Notes (Signed)
. ? ? ?HEMATOLOGY/ONCOLOGY CLINIC NOTE ? ?Date of Service: 08/20/2021 ? ? ?Patient Care Team: ?Drosinis, Pamalee Leyden, PA-C as PCP - General (Internal Medicine) ? ?CHIEF COMPLAINTS/PURPOSE OF CONSULTATION:  ? ?Follow-up for recently diagnosed CLL for continued evaluation and management. ? ?HISTORY OF PRESENTING ILLNESS:  ?Please see previous note for details initial presentation ? ?INTERVAL HISTORY 05/21/2021. ? ?[name] is here for follow-up for continued evaluation and management of his recently diagnosed CLL.  His last appointment with [place] was about 3 to 4 months ago. ?Patient notes no acute new symptoms since his last clinic visit. ?No fevers no chills no night sweats no unexpected weight loss. ?No new lumps or bumps. ?No new chest pain or shortness of breath. ?No abdominal pain or distention.. ? ?Labs done today were reviewed with the patient in detail. ? ?MEDICAL HISTORY:  ?Past Medical History:  ?Diagnosis Date  ? Arthritis   ? Cyst of kidney, acquired   ? history of , monitored  ? Hypertension   ? ? ?SURGICAL HISTORY: ?Past Surgical History:  ?Procedure Laterality Date  ? APPENDECTOMY    ? BACK SURGERY    ? JOINT REPLACEMENT    ? left knee  ? KNEE ARTHROTOMY  12/28/1968  ? open cleanout  ? TONSILLECTOMY    ? TOTAL KNEE ARTHROPLASTY Left 03/13/2015  ? Procedure: LEFT TOTAL KNEE ARTHROPLASTY;  Surgeon: Elsie Saas, MD;  Location: [place];  Service: Orthopedics;  Laterality: Left;  ? TOTAL KNEE ARTHROPLASTY Right 04/10/2021  ? Procedure: RIGHT TOTAL KNEE ARTHROPLASTY;  Surgeon: Meredith Pel, MD;  Location: [place];  Service: Orthopedics;  Laterality: Right;  ? ? ?SOCIAL HISTORY: ?Social History  ? ?Socioeconomic History  ? Marital status: Married  ?  Spouse name: Not on file  ? Number of children: Not on file  ? Years of education: Not on file  ? Highest education level: Not on file  ?Occupational History  ? Not on file  ?Tobacco Use  ? Smoking status: Former  ?  Types: Cigarettes  ?  Quit date:  04/29/1973  ?  Years since quitting: 48.3  ? Smokeless tobacco: Not on file  ?Substance and Sexual Activity  ? Alcohol use: Yes  ?  Alcohol/week: 3.0 standard drinks  ?  Types: 3 Glasses of wine per week  ? Drug use: Not on file  ? Sexual activity: Not on file  ?Other Topics Concern  ? Not on file  ?Social History Narrative  ? Not on file  ? ?Social Determinants of Health  ? ?Financial Resource Strain: Not on file  ?Food Insecurity: Not on file  ?Transportation Needs: Not on file  ?Physical Activity: Not on file  ?Stress: Not on file  ?Social Connections: Not on file  ?Intimate Partner Violence: Not on file  ? ? ?FAMILY HISTORY: ?No reported family history of known blood disorders or cancers. ? ?ALLERGIES:  has No Known Allergies. ? ?MEDICATIONS:  ?Current Outpatient Medications  ?Medication Sig Dispense Refill  ? Ascorbic Acid (VITAMIN C) 1000 MG tablet Take 1,000 mg by mouth daily.    ? Ashwagandha 500 MG CAPS Take 500 mg by mouth daily.    ? aspirin 81 MG chewable tablet Chew 1 tablet (81 mg total) by mouth 2 (two) times daily. 60 tablet 0  ? atorvastatin (LIPITOR) 10 MG tablet Take by mouth.    ? Calcium Citrate-Vitamin D (CALCIUM + D PO) Take 1 tablet by mouth daily.    ? cholecalciferol (VITAMIN D3)  25 MCG (1000 UNIT) tablet Take 1,000 Units by mouth daily.    ? Flaxseed, Linseed, (FLAXSEED OIL) 1000 MG CAPS Take 2,000 mg by mouth daily.    ? L-Arginine 500 MG CAPS Take 500 mg by mouth daily.    ? milk thistle 175 MG tablet Take 175 mg by mouth daily.    ? Multiple Vitamins-Minerals (MULTIVITAMIN WITH MINERALS) tablet Take 1 tablet by mouth daily.    ? olmesartan-hydrochlorothiazide (BENICAR HCT) 20-12.5 MG tablet Take 1 tablet by mouth daily.    ? OVER THE COUNTER MEDICATION Apply 1 application topically 2 (two) times daily as needed (pain relief). CBD Oil with Emu Oil 500 mg    ? tadalafil (CIALIS) 5 MG tablet Take 5 mg by mouth daily as needed for erectile dysfunction.    ? Vitamin A 2400 MCG (8000 UT) CAPS  Take 8,000 Units by mouth daily.    ? zinc gluconate 50 MG tablet Take 50 mg by mouth daily.    ? methocarbamol (ROBAXIN) 500 MG tablet Take 1 tablet (500 mg total) by mouth every 8 (eight) hours as needed for muscle spasms. (Patient not taking: Reported on 08/20/2021) 30 tablet 0  ? oxyCODONE (OXY IR/ROXICODONE) 5 MG immediate release tablet Take 1-2 tablets (5-10 mg total) by mouth every 4 (four) hours as needed for moderate pain (pain score 4-6). (Patient not taking: Reported on 08/20/2021) 30 tablet 0  ? oxyCODONE (OXY IR/ROXICODONE) 5 MG immediate release tablet 1 po q 4 hrs prn pain (Patient not taking: Reported on 08/20/2021) 45 tablet 0  ? ?No current facility-administered medications for this visit.  ? ? ?REVIEW OF SYSTEMS:   ?10 Point review of Systems was done is negative except as noted above. ? ?PHYSICAL EXAMINATION: ?ECOG PERFORMANCE STATUS: 1 ? ?. ?Vitals:  ? 08/20/21 1552  ?BP: 134/76  ?Pulse: 78  ?Resp: 20  ?Temp: 98.4 ?F (36.9 ?C)  ?SpO2: 100%  ? ?Filed Weights  ? 08/20/21 1552  ?Weight: 235 lb 4.8 oz (106.7 kg)  ? ?.Body mass index is 30.21 kg/m?[name] ?NAD ?GENERAL:alert, in no acute distress and comfortable ?SKIN: no acute rashes, no significant lesions ?EYES: conjunctiva are pink and non-injected, sclera anicteric ?OROPHARYNX: MMM, no exudates, no oropharyngeal erythema or ulceration ?NECK: supple, no JVD ?LYMPH:  no palpable lymphadenopathy in the cervical, axillary or inguinal regions ?LUNGS: clear to auscultation b/l with normal respiratory effort ?HEART: regular rate & rhythm ?ABDOMEN:  normoactive bowel sounds , non tender, not distended. ?Extremity: no pedal edema ?PSYCH: alert & oriented x 3 with fluent speech ?NEURO: no focal motor/sensory deficits ? ? ?LABORATORY DATA:  ?I have reviewed the data as listed ? ? ?  Latest Ref Rng & Units 08/20/2021  ?  2:48 PM 05/09/2021  ?  2:14 PM 04/11/2021  ?  2:09 AM  ?CBC  ?WBC 4.0 - 10.5 K/uL 28.6   18.3   22.4    ?Hemoglobin 13.0 - 17.0 g/dL 12.6   11.1    11.0    ?Hematocrit 39.0 - 52.0 % 40.0   36.9   35.2    ?Platelets 150 - 400 K/uL 190   277   182    ? ?.CBC ?   ?Component Value Date/Time  ? WBC 28.6 (H) 08/20/2021 1448  ? RBC 4.38 08/20/2021 1448  ? RBC 4.44 08/20/2021 1448  ? HGB 12.6 (L) 08/20/2021 1448  ? HCT 40.0 08/20/2021 1448  ? PLT 190 08/20/2021 1448  ? MCV 90.1 08/20/2021 1448  ?  MCH 28.4 08/20/2021 1448  ? MCHC 31.5 08/20/2021 1448  ? RDW 13.7 08/20/2021 1448  ? LYMPHSABS 10.6 (H) 08/20/2021 1448  ? MONOABS 1.1 (H) 08/20/2021 1448  ? EOSABS 0.3 08/20/2021 1448  ? BASOSABS 0.0 08/20/2021 1448  ? ? ? ?. ? ?  Latest Ref Rng & Units 08/20/2021  ?  2:48 PM 05/09/2021  ?  2:14 PM 04/11/2021  ?  2:09 AM  ?CMP  ?Glucose 70 - 99 mg/dL 122   90   123    ?BUN 8 - 23 mg/dL '13   13   11    '$ ?Creatinine 0.61 - 1.24 mg/dL 1.02   0.98   1.14    ?Sodium 135 - 145 mmol/L 140   138   133    ?Potassium 3.5 - 5.1 mmol/L 4.0   4.1   4.3    ?Chloride 98 - 111 mmol/L 105   102   101    ?CO2 22 - 32 mmol/L '29   27   27    '$ ?Calcium 8.9 - 10.3 mg/dL 9.4   9.6   8.8    ?Total Protein 6.5 - 8.1 g/dL 7.0   7.6     ?Total Bilirubin 0.3 - 1.2 mg/dL 0.9   0.8     ?Alkaline Phos 38 - 126 U/L 82   104     ?AST 15 - 41 U/L 18   17     ?ALT 0 - 44 U/L 11   10     ? ?Component ?    Latest Ref Rng & Units 05/09/2021  ?DAT, complement ?     NEG  ?DAT, IgG ?     NEG . . .  ?Ferritin ?    24 - 336 ng/mL 193  ?Vitamin B12 ?    180 - 914 pg/mL 479  ?Haptoglobin ?    32 - 363 mg/dL 193  ?HIV Screen 4th Generation wRfx ?    Non Reactive Non Reactive  ?HCV Ab ?    NON REACTIVE NON REACTIVE  ?Hep B Core Total Ab ?    NON REACTIVE NON REACTIVE  ?Hepatitis B Surface Ag ?    NON REACTIVE NON REACTIVE  ?LDH ?    98 - 192 U/L 133  ? ? ? ? ?RADIOGRAPHIC STUDIES: ?I have personally reviewed the radiological images as listed and agreed with the findings in the report. ? ?PET/CT 05/17/2021:IMPRESSION: ?1. Hypermetabolic prominent/mildly enlarged right pelvic lymph ?nodes, with activity greater than liver  ([place] 4). ?  ?2. Low level metabolic activity within prominent bilateral axillary, ?retroperitoneal and left pelvic sidewall lymph nodes. ?  ?3.  No splenomegaly. ?  ?4. Asymmetric nodular hyperactivity on

## 2021-12-20 ENCOUNTER — Other Ambulatory Visit: Payer: Self-pay

## 2021-12-20 DIAGNOSIS — C9110 Chronic lymphocytic leukemia of B-cell type not having achieved remission: Secondary | ICD-10-CM

## 2021-12-21 ENCOUNTER — Inpatient Hospital Stay: Payer: 59

## 2021-12-21 ENCOUNTER — Inpatient Hospital Stay: Payer: 59 | Attending: Hematology | Admitting: Hematology

## 2021-12-21 ENCOUNTER — Other Ambulatory Visit: Payer: Self-pay

## 2021-12-21 VITALS — BP 143/76 | HR 61 | Temp 97.7°F | Resp 15 | Wt 233.0 lb

## 2021-12-21 DIAGNOSIS — Z79899 Other long term (current) drug therapy: Secondary | ICD-10-CM | POA: Diagnosis not present

## 2021-12-21 DIAGNOSIS — Z87891 Personal history of nicotine dependence: Secondary | ICD-10-CM | POA: Insufficient documentation

## 2021-12-21 DIAGNOSIS — C9110 Chronic lymphocytic leukemia of B-cell type not having achieved remission: Secondary | ICD-10-CM | POA: Insufficient documentation

## 2021-12-21 LAB — CBC WITH DIFFERENTIAL (CANCER CENTER ONLY)
Abs Immature Granulocytes: 0.06 10*3/uL (ref 0.00–0.07)
Basophils Absolute: 0.1 10*3/uL (ref 0.0–0.1)
Basophils Relative: 0 %
Eosinophils Absolute: 0.1 10*3/uL (ref 0.0–0.5)
Eosinophils Relative: 1 %
HCT: 40.5 % (ref 39.0–52.0)
Hemoglobin: 13 g/dL (ref 13.0–17.0)
Immature Granulocytes: 0 %
Lymphocytes Relative: 72 %
Lymphs Abs: 22.3 10*3/uL — ABNORMAL HIGH (ref 0.7–4.0)
MCH: 29.3 pg (ref 26.0–34.0)
MCHC: 32.1 g/dL (ref 30.0–36.0)
MCV: 91.2 fL (ref 80.0–100.0)
Monocytes Absolute: 3.6 10*3/uL — ABNORMAL HIGH (ref 0.1–1.0)
Monocytes Relative: 12 %
Neutro Abs: 4.8 10*3/uL (ref 1.7–7.7)
Neutrophils Relative %: 15 %
Platelet Count: 186 10*3/uL (ref 150–400)
RBC: 4.44 MIL/uL (ref 4.22–5.81)
RDW: 13.2 % (ref 11.5–15.5)
Smear Review: NORMAL
WBC Count: 30.9 10*3/uL — ABNORMAL HIGH (ref 4.0–10.5)
nRBC: 0 % (ref 0.0–0.2)

## 2021-12-21 LAB — CMP (CANCER CENTER ONLY)
ALT: 13 U/L (ref 0–44)
AST: 22 U/L (ref 15–41)
Albumin: 4.5 g/dL (ref 3.5–5.0)
Alkaline Phosphatase: 85 U/L (ref 38–126)
Anion gap: 4 — ABNORMAL LOW (ref 5–15)
BUN: 12 mg/dL (ref 8–23)
CO2: 27 mmol/L (ref 22–32)
Calcium: 9.7 mg/dL (ref 8.9–10.3)
Chloride: 107 mmol/L (ref 98–111)
Creatinine: 0.85 mg/dL (ref 0.61–1.24)
GFR, Estimated: >60 mL/min (ref >60)
Glucose, Bld: 92 mg/dL (ref 70–99)
Potassium: 4.3 mmol/L (ref 3.5–5.1)
Sodium: 138 mmol/L (ref 135–145)
Total Bilirubin: 1.2 mg/dL (ref 0.3–1.2)
Total Protein: 6.9 g/dL (ref 6.5–8.1)

## 2021-12-21 LAB — RETICULOCYTES
Immature Retic Fract: 22.3 % — ABNORMAL HIGH (ref 2.3–15.9)
RBC.: 4.36 MIL/uL (ref 4.22–5.81)
Retic Count, Absolute: 99 10*3/uL (ref 19.0–186.0)
Retic Ct Pct: 2.3 % (ref 0.4–3.1)

## 2021-12-21 LAB — LACTATE DEHYDROGENASE: LDH: 150 U/L (ref 98–192)

## 2021-12-23 NOTE — Progress Notes (Signed)
[name]   HEMATOLOGY/ONCOLOGY CLINIC NOTE  Date of Service: 12/21/2021   Patient Care Team: Drosinis, Pamalee Leyden, PA-C as PCP - General (Internal Medicine)  CHIEF COMPLAINTS/PURPOSE OF CONSULTATION:   Follow-up for recently diagnosed CLL for continued evaluation and management.  HISTORY OF PRESENTING ILLNESS:  Please see previous note for details initial presentation  INTERVAL HISTORY: [name] is a 66 y.o. male here for follow-up for continued evaluation and management of his recently diagnosed CLL.    ***  His last appointment with [place] was about 4 months ago.  No fever, chills, night sweats. No new lumps, bumps, or lesions/rashes. No abdominal pain or distension. No new or unexpected weight loss. No SOB or chest pain. No other new or acute focal symptoms.  Labs done today were reviewed with him in detail.  MEDICAL HISTORY:  Past Medical History:  Diagnosis Date   Arthritis    Cyst of kidney, acquired    history of , monitored   Hypertension     SURGICAL HISTORY: Past Surgical History:  Procedure Laterality Date   APPENDECTOMY     BACK SURGERY     JOINT REPLACEMENT     left knee   KNEE ARTHROTOMY  12/28/1968   open cleanout   TONSILLECTOMY     TOTAL KNEE ARTHROPLASTY Left 03/13/2015   Procedure: LEFT TOTAL KNEE ARTHROPLASTY;  Surgeon: Elsie Saas, MD;  Location: [place];  Service: Orthopedics;  Laterality: Left;   TOTAL KNEE ARTHROPLASTY Right 04/10/2021   Procedure: RIGHT TOTAL KNEE ARTHROPLASTY;  Surgeon: Meredith Pel, MD;  Location: [place];  Service: Orthopedics;  Laterality: Right;    SOCIAL HISTORY: Social History   Socioeconomic History   Marital status: Married    Spouse name: Not on file   Number of children: Not on file   Years of education: Not on file   Highest education level: Not on file  Occupational History   Not on file  Tobacco Use   Smoking status: Former    Types: Cigarettes    Quit date: 04/29/1973    Years since  quitting: 48.6   Smokeless tobacco: Not on file  Substance and Sexual Activity   Alcohol use: Yes    Alcohol/week: 3.0 standard drinks of alcohol    Types: 3 Glasses of wine per week   Drug use: Not on file   Sexual activity: Not on file  Other Topics Concern   Not on file  Social History Narrative   Not on file   Social Determinants of Health   Financial Resource Strain: Not on file  Food Insecurity: Not on file  Transportation Needs: Not on file  Physical Activity: Not on file  Stress: Not on file  Social Connections: Not on file  Intimate Partner Violence: Not on file    FAMILY HISTORY: No reported family history of known blood disorders or cancers.  ALLERGIES:  has No Known Allergies.  MEDICATIONS:  Current Outpatient Medications  Medication Sig Dispense Refill   Ascorbic Acid (VITAMIN C) 1000 MG tablet Take 1,000 mg by mouth daily.     Ashwagandha 500 MG CAPS Take 500 mg by mouth daily.     aspirin 81 MG chewable tablet Chew 1 tablet (81 mg total) by mouth 2 (two) times daily. 60 tablet 0   atorvastatin (LIPITOR) 10 MG tablet Take by mouth.     Calcium Citrate-Vitamin D (CALCIUM + D PO) Take 1 tablet by mouth daily.     cholecalciferol (VITAMIN D3)  25 MCG (1000 UNIT) tablet Take 1,000 Units by mouth daily.     Flaxseed, Linseed, (FLAXSEED OIL) 1000 MG CAPS Take 2,000 mg by mouth daily.     L-Arginine 500 MG CAPS Take 500 mg by mouth daily.     methocarbamol (ROBAXIN) 500 MG tablet Take 1 tablet (500 mg total) by mouth every 8 (eight) hours as needed for muscle spasms. (Patient not taking: Reported on 08/20/2021) 30 tablet 0   milk thistle 175 MG tablet Take 175 mg by mouth daily.     Multiple Vitamins-Minerals (MULTIVITAMIN WITH MINERALS) tablet Take 1 tablet by mouth daily.     olmesartan-hydrochlorothiazide (BENICAR HCT) 20-12.5 MG tablet Take 1 tablet by mouth daily.     OVER THE COUNTER MEDICATION Apply 1 application topically 2 (two) times daily as needed (pain  relief). CBD Oil with Emu Oil 500 mg     oxyCODONE (OXY IR/ROXICODONE) 5 MG immediate release tablet Take 1-2 tablets (5-10 mg total) by mouth every 4 (four) hours as needed for moderate pain (pain score 4-6). (Patient not taking: Reported on 08/20/2021) 30 tablet 0   oxyCODONE (OXY IR/ROXICODONE) 5 MG immediate release tablet 1 po q 4 hrs prn pain (Patient not taking: Reported on 08/20/2021) 45 tablet 0   tadalafil (CIALIS) 5 MG tablet Take 5 mg by mouth daily as needed for erectile dysfunction.     Vitamin A 2400 MCG (8000 UT) CAPS Take 8,000 Units by mouth daily.     zinc gluconate 50 MG tablet Take 50 mg by mouth daily.     No current facility-administered medications for this visit.    REVIEW OF SYSTEMS:   10 Point review of Systems was done is negative except as noted above.  PHYSICAL EXAMINATION: ECOG PERFORMANCE STATUS: 1  . Vitals:   12/21/21 1353  BP: (!) 143/76  Pulse: 61  Resp: 15  Temp: 97.7 F (36.5 C)  SpO2: 100%   Filed Weights   12/21/21 1353  Weight: 233 lb (105.7 kg)   .Body mass index is 29.92 kg/m. NAD*** GENERAL:alert, in no acute distress and comfortable SKIN: no acute rashes, no significant lesions EYES: conjunctiva are pink and non-injected, sclera anicteric NECK: supple, no JVD LYMPH:  no palpable lymphadenopathy in the cervical, axillary or inguinal regions LUNGS: clear to auscultation b/l with normal respiratory effort HEART: regular rate & rhythm ABDOMEN:  normoactive bowel sounds , non tender, not distended. Extremity: no pedal edema PSYCH: alert & oriented x 3 with fluent speech NEURO: no focal motor/sensory deficits  LABORATORY DATA:  I have reviewed the data as listed     Latest Ref Rng & Units 12/21/2021   11:35 AM 08/20/2021    2:48 PM 05/09/2021    2:14 PM  CBC  WBC 4.0 - 10.5 K/uL 30.9  28.6  18.3   Hemoglobin 13.0 - 17.0 g/dL 13.0  12.6  11.1   Hematocrit 39.0 - 52.0 % 40.5  40.0  36.9   Platelets 150 - 400 K/uL 186  190  277     .CBC    Component Value Date/Time   WBC 30.9 (H) 12/21/2021 1135   WBC 28.6 (H) 08/20/2021 1448   RBC 4.44 12/21/2021 1135   RBC 4.36 12/21/2021 1134   HGB 13.0 12/21/2021 1135   HCT 40.5 12/21/2021 1135   PLT 186 12/21/2021 1135   MCV 91.2 12/21/2021 1135   MCH 29.3 12/21/2021 1135   MCHC 32.1 12/21/2021 1135   RDW 13.2 12/21/2021 1135  LYMPHSABS 22.3 (H) 12/21/2021 1135   MONOABS 3.6 (H) 12/21/2021 1135   EOSABS 0.1 12/21/2021 1135   BASOSABS 0.1 12/21/2021 1135     .    Latest Ref Rng & Units 12/21/2021   11:35 AM 08/20/2021    2:48 PM 05/09/2021    2:14 PM  CMP  Glucose 70 - 99 mg/dL 92  122  90   BUN 8 - 23 mg/dL '12  13  13   '$ Creatinine 0.61 - 1.24 mg/dL 0.85  1.02  0.98   Sodium 135 - 145 mmol/L 138  140  138   Potassium 3.5 - 5.1 mmol/L 4.3  4.0  4.1   Chloride 98 - 111 mmol/L 107  105  102   CO2 22 - 32 mmol/L '27  29  27   '$ Calcium 8.9 - 10.3 mg/dL 9.7  9.4  9.6   Total Protein 6.5 - 8.1 g/dL 6.9  7.0  7.6   Total Bilirubin 0.3 - 1.2 mg/dL 1.2  0.9  0.8   Alkaline Phos 38 - 126 U/L 85  82  104   AST 15 - 41 U/L '22  18  17   '$ ALT 0 - 44 U/L '13  11  10    '$ Component     Latest Ref Rng & Units 05/09/2021  DAT, complement      NEG  DAT, IgG      NEG . . .  Ferritin     24 - 336 ng/mL 193  Vitamin B12     180 - 914 pg/mL 479  Haptoglobin     32 - 363 mg/dL 193  HIV Screen 4th Generation wRfx     Non Reactive Non Reactive  HCV Ab     NON REACTIVE NON REACTIVE  Hep B Core Total Ab     NON REACTIVE NON REACTIVE  Hepatitis B Surface Ag     NON REACTIVE NON REACTIVE  LDH     98 - 192 U/L 133      RADIOGRAPHIC STUDIES: I have personally reviewed the radiological images as listed and agreed with the findings in the report.  PET/CT 05/17/2021:IMPRESSION: 1. Hypermetabolic prominent/mildly enlarged right pelvic lymph nodes, with activity greater than liver ([place] 4).   2. Low level metabolic activity within prominent bilateral  axillary, retroperitoneal and left pelvic sidewall lymph nodes.   3.  No splenomegaly.   4. Asymmetric nodular hyperactivity on the right side of the prostate gland with a max SUV of 7.26 without discrete CT correlate, nonspecific but prostate carcinoma not excluded, recommend correlation with PSA and urology consult.   5. Hypermetabolic focus in the left hemipelvis along the gonadal vessels with a max SUV of 6.75, without discrete CT correlate and possibly vascular in etiology, attention on follow-up imaging suggested.   6. Hypermetabolic hyperplasia of the tonsils slightly asymmetric to the right with a max SUV of 5.1 common likely reactive.   7. Bilateral symmetric diffuse parotid gland hypermetabolism with max SUV of 4.9 on the right and 4.7 on the left without focal hypermetabolism or CT correlate, likely inflammatory     Electronically Signed   By: Dahlia Bailiff M.D.   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 Incidentally noted on labs in the context of preoperative labs prior to his recent right TKA. CLL FISH panel with 11 q. and 13 q. deletions PLAN -Patient's labs from today were discussed in detail with him. CBC shows slight increase in WBC count of 30.9k but no significant anemia or thrombocytopenia. LDH is 150. Reticulocytes shows elevated retic fract of 22.3% -His PET CT scan was previously discussed with him and showed no bulky disease or significant splenomegaly. -He has no constitutional symptoms for any other signs or symptoms of significant CLL progression at this time. -No  indication to proceed with CLL treatment at this time -We shall space out his follow-ups to every 4 months at this time  2) incidental note of FDG avid prostate lesions. Patient has a family history of prostate cancer. He notes that he has been followed by urology previously for elevated PSA levels and he has had prostate biopsy previously PLAN He will continue monitoring and management as per urology.  Follow-up RTC with Dr Irene Limbo with labs in 5 months  The total time spent in the appointment was *** minutes*.  All of the patient's questions were answered with apparent satisfaction. The patient knows to call the clinic with any problems, questions or concerns.   Sullivan Lone MD MS AAHIVMS [facility],The Hematology/Oncology Physician [facility]  .*Total Encounter Time as defined by the Centers for Medicare and Medicaid Services includes, in addition to the face-to-face time of a patient visit (documented in the note above) non-face-to-face time: obtaining and reviewing outside history, ordering and reviewing medications, tests or procedures, care coordination (communications with other health care professionals or caregivers) and documentation in the medical record.  I, Melene Muller, am acting as scribe for Dr. Sullivan Lone, MD.

## 2021-12-24 ENCOUNTER — Telehealth: Payer: Self-pay | Admitting: Hematology

## 2021-12-24 NOTE — Telephone Encounter (Signed)
Scheduled follow-up appointment per 8/25 los. Patient is aware.

## 2022-01-12 ENCOUNTER — Emergency Department (HOSPITAL_COMMUNITY)
Admission: EM | Admit: 2022-01-12 | Discharge: 2022-01-13 | Disposition: A | Discharge status: Home / Self Care | Payer: 59 | Attending: Student | Admitting: Student

## 2022-01-12 ENCOUNTER — Ambulatory Visit (HOSPITAL_COMMUNITY)
Admission: EM | Admit: 2022-01-12 | Discharge: 2022-01-12 | Disposition: A | Discharge status: Other Acute Inpatient Hospital | Payer: 59

## 2022-01-12 ENCOUNTER — Encounter (HOSPITAL_COMMUNITY): Payer: Self-pay

## 2022-01-12 ENCOUNTER — Other Ambulatory Visit: Payer: Self-pay

## 2022-01-12 DIAGNOSIS — Z5321 Procedure and treatment not carried out due to patient leaving prior to being seen by health care provider: Secondary | ICD-10-CM | POA: Diagnosis not present

## 2022-01-12 DIAGNOSIS — R319 Hematuria, unspecified: Secondary | ICD-10-CM | POA: Diagnosis present

## 2022-01-12 HISTORY — DX: Chronic lymphocytic leukemia of B-cell type not having achieved remission: C91.10

## 2022-01-12 LAB — URINALYSIS, ROUTINE W REFLEX MICROSCOPIC
Bilirubin Urine: NEGATIVE
Glucose, UA: NEGATIVE mg/dL
Hgb urine dipstick: NEGATIVE
Ketones, ur: NEGATIVE mg/dL
Leukocytes,Ua: NEGATIVE
Nitrite: NEGATIVE
Protein, ur: NEGATIVE mg/dL
Specific Gravity, Urine: 1.002 — ABNORMAL LOW (ref 1.005–1.030)
pH: 7 (ref 5.0–8.0)

## 2022-01-12 LAB — BASIC METABOLIC PANEL
Anion gap: 6 (ref 5–15)
BUN: 11 mg/dL (ref 8–23)
CO2: 27 mmol/L (ref 22–32)
Calcium: 9.5 mg/dL (ref 8.9–10.3)
Chloride: 107 mmol/L (ref 98–111)
Creatinine, Ser: 0.99 mg/dL (ref 0.61–1.24)
GFR, Estimated: >60 mL/min (ref >60)
Glucose, Bld: 90 mg/dL (ref 70–99)
Potassium: 4.5 mmol/L (ref 3.5–5.1)
Sodium: 140 mmol/L (ref 135–145)

## 2022-01-12 LAB — CBC WITH DIFFERENTIAL/PLATELET
Abs Immature Granulocytes: 0.07 10*3/uL (ref 0.00–0.07)
Basophils Absolute: 0.1 10*3/uL (ref 0.0–0.1)
Basophils Relative: 0 %
Eosinophils Absolute: 0.1 10*3/uL (ref 0.0–0.5)
Eosinophils Relative: 0 %
HCT: 44.5 % (ref 39.0–52.0)
Hemoglobin: 13.3 g/dL (ref 13.0–17.0)
Immature Granulocytes: 0 %
Lymphocytes Relative: 80 %
Lymphs Abs: 26.6 10*3/uL — ABNORMAL HIGH (ref 0.7–4.0)
MCH: 29.5 pg (ref 26.0–34.0)
MCHC: 29.9 g/dL — ABNORMAL LOW (ref 30.0–36.0)
MCV: 98.7 fL (ref 80.0–100.0)
Monocytes Absolute: 1.7 10*3/uL — ABNORMAL HIGH (ref 0.1–1.0)
Monocytes Relative: 5 %
Neutro Abs: 4.9 10*3/uL (ref 1.7–7.7)
Neutrophils Relative %: 15 %
Platelets: 174 10*3/uL (ref 150–400)
RBC: 4.51 MIL/uL (ref 4.22–5.81)
RDW: 12.6 % (ref 11.5–15.5)
WBC: 33.5 10*3/uL — ABNORMAL HIGH (ref 4.0–10.5)
nRBC: 0 % (ref 0.0–0.2)

## 2022-01-12 NOTE — ED Triage Notes (Addendum)
Patient reports that he had an episode of hematuria with a small clot this afternoon. Patient states he increased his water intake and his urine cleared. Patient reports a cyst on each kidney.  Patient reports that he has chronic lymphatic leukemia.

## 2022-01-12 NOTE — ED Provider Triage Note (Signed)
Emergency Medicine Provider Triage Evaluation Note  Kevin Patterson , a 66 y.o. male  was evaluated in triage.  Pt complains of hematuria.  Reports passing a small blood clot this afternoon.  He said after that he had 2 episodes of painful urination however has not had any episodes since.  History of CLL.  No pain  Review of Systems  Positive: Hematuria Negative: Dysuria or abdominal pain  Physical Exam  BP 137/82 (BP Location: Left Arm)   Pulse 68   Temp 98.7 F (37.1 C) (Oral)   Resp 16   Ht '6\' 3"'$  (1.905 m)   Wt 104.3 kg   SpO2 95%   BMI 28.75 kg/m  Gen:   Awake, no distress   Resp:  Normal effort  MSK:   Moves extremities without difficulty  Other:    Medical Decision Making  Medically screening exam initiated at 6:32 PM.  Appropriate orders placed.  Blythe Hartshorn was informed that the remainder of the evaluation will be completed by another provider, this initial triage assessment does not replace that evaluation, and the importance of remaining in the ED until their evaluation is complete.     Rhae Hammock, PA-C 01/12/22 [phone]

## 2022-01-14 NOTE — ED Provider Notes (Signed)
Patient presents urgent care for evaluation after a couple of episodes of acute gross hematuria that he first noticed this afternoon.  Patient went to avoid couple of hours ago and noticed a red "clot" expelled when voiding followed by pink urine with associated urinary discomfort.  After initial episode of hematuria, this happened a second time this afternoon but without visible clot to the urine.  Reports dysuria but denies urinary frequency, urgency, hesitancy, and suprapubic abdominal pain.  No fever, nausea, vomiting, diarrhea, dizziness, or constipation reported.  Denies penile discharge and recent new sexual partners.  Patient is currently receiving treatment for chronic lymphocytic leukemia and has a history of "cyst to the kidney".  He is currently without pain and is clinically well-appearing.  Vital signs are stable.  Recommend patient go to the nearest emergency department for further evaluation of gross hematuria for urgent blood work to rule out kidney pathology related to gross hematuria due to presence of chronic lymphocytic leukemia.  He is immunocompromised due to CLL and may require higher level of care for possible acute complicated urinary tract infection. Discussed these recommendations with patient who verbalizes understanding and agreement with plan. Discharged via personal vehicle to go to the nearest emergency department for further evaluation.    Talbot Grumbling, [place] 01/14/22 1013

## 2022-05-27 ENCOUNTER — Inpatient Hospital Stay: Payer: 59

## 2022-05-27 ENCOUNTER — Inpatient Hospital Stay: Payer: 59 | Attending: Hematology | Admitting: Hematology

## 2022-05-27 VITALS — BP 145/83 | HR 73 | Temp 97.7°F | Resp 20 | Wt 245.4 lb

## 2022-05-27 DIAGNOSIS — Z7982 Long term (current) use of aspirin: Secondary | ICD-10-CM | POA: Diagnosis not present

## 2022-05-27 DIAGNOSIS — Z79899 Other long term (current) drug therapy: Secondary | ICD-10-CM | POA: Insufficient documentation

## 2022-05-27 DIAGNOSIS — C9110 Chronic lymphocytic leukemia of B-cell type not having achieved remission: Secondary | ICD-10-CM | POA: Diagnosis not present

## 2022-05-27 DIAGNOSIS — Z87891 Personal history of nicotine dependence: Secondary | ICD-10-CM | POA: Insufficient documentation

## 2022-05-27 LAB — CBC WITH DIFFERENTIAL (CANCER CENTER ONLY)
Abs Immature Granulocytes: 0.07 10*3/uL (ref 0.00–0.07)
Basophils Absolute: 0.1 10*3/uL (ref 0.0–0.1)
Basophils Relative: 0 %
Eosinophils Absolute: 0.2 10*3/uL (ref 0.0–0.5)
Eosinophils Relative: 0 %
HCT: 40.3 % (ref 39.0–52.0)
Hemoglobin: 13 g/dL (ref 13.0–17.0)
Immature Granulocytes: 0 %
Lymphocytes Relative: 85 %
Lymphs Abs: 35 10*3/uL — ABNORMAL HIGH (ref 0.7–4.0)
MCH: 29.5 pg (ref 26.0–34.0)
MCHC: 32.3 g/dL (ref 30.0–36.0)
MCV: 91.4 fL (ref 80.0–100.0)
Monocytes Absolute: 2.6 10*3/uL — ABNORMAL HIGH (ref 0.1–1.0)
Monocytes Relative: 6 %
Neutro Abs: 3.6 10*3/uL (ref 1.7–7.7)
Neutrophils Relative %: 9 %
Platelet Count: 161 10*3/uL (ref 150–400)
RBC: 4.41 MIL/uL (ref 4.22–5.81)
RDW: 12.6 % (ref 11.5–15.5)
Smear Review: NORMAL
WBC Count: 41.6 10*3/uL — ABNORMAL HIGH (ref 4.0–10.5)
nRBC: 0 % (ref 0.0–0.2)

## 2022-05-27 LAB — CMP (CANCER CENTER ONLY)
ALT: 10 U/L (ref 0–44)
AST: 17 U/L (ref 15–41)
Albumin: 4.1 g/dL (ref 3.5–5.0)
Alkaline Phosphatase: 83 U/L (ref 38–126)
Anion gap: 4 — ABNORMAL LOW (ref 5–15)
BUN: 14 mg/dL (ref 8–23)
CO2: 29 mmol/L (ref 22–32)
Calcium: 9.5 mg/dL (ref 8.9–10.3)
Chloride: 106 mmol/L (ref 98–111)
Creatinine: 1.15 mg/dL (ref 0.61–1.24)
GFR, Estimated: >60 mL/min (ref >60)
Glucose, Bld: 92 mg/dL (ref 70–99)
Potassium: 4 mmol/L (ref 3.5–5.1)
Sodium: 139 mmol/L (ref 135–145)
Total Bilirubin: 1.1 mg/dL (ref 0.3–1.2)
Total Protein: 6.7 g/dL (ref 6.5–8.1)

## 2022-05-27 LAB — LACTATE DEHYDROGENASE: LDH: 115 U/L (ref 98–192)

## 2022-05-27 NOTE — Progress Notes (Signed)
Kevin Patterson   HEMATOLOGY/ONCOLOGY CLINIC NOTE  Date of Service: 05/27/22   Patient Care Team: Drosinis, Pamalee Leyden, PA-C as PCP - General (Internal Medicine)  CHIEF COMPLAINTS/PURPOSE OF CONSULTATION:   Follow-up for continued evaluation and management of CLL  HISTORY OF PRESENTING ILLNESS:  Please see previous note for details initial presentation  INTERVAL HISTORY: [name] is a 67 y.o. male here for follow-up for continued evaluation and management of his recently diagnosed CLL.    Patient was last seen by me on 12/21/2021 and he was doing well overall.   Patient reports he has been doing well without any new medical concerns since our last visit. He reports he had mild cold symptoms recently, but is doing better now. He complains of mild scab on his right leg below his knee, which he noticed around 2 weeks ago. He did have a fall recently, but did not hurt his right leg.   Patient denies fever, chills, night sweats, unexpected weight loss, new lumps/bumps, abnormal bowel movement, urination problems, bone pain, back pain, chest pain, or leg swelling.   MEDICAL HISTORY:  Past Medical History:  Diagnosis Date   Arthritis    CLL (chronic lymphocytic leukemia) ([place])    Cyst of kidney, acquired    history of , monitored   Hypertension     SURGICAL HISTORY: Past Surgical History:  Procedure Laterality Date   APPENDECTOMY     BACK SURGERY     JOINT REPLACEMENT     left knee   KNEE ARTHROTOMY  12/28/1968   open cleanout   TONSILLECTOMY     TOTAL KNEE ARTHROPLASTY Left 03/13/2015   Procedure: LEFT TOTAL KNEE ARTHROPLASTY;  Surgeon: Elsie Saas, MD;  Location: [facility];  Service: Orthopedics;  Laterality: Left;   TOTAL KNEE ARTHROPLASTY Right 04/10/2021   Procedure: RIGHT TOTAL KNEE ARTHROPLASTY;  Surgeon: Meredith Pel, MD;  Location: [facility];  Service: Orthopedics;  Laterality: Right;    SOCIAL HISTORY: Social History   Socioeconomic History   Marital status:  Married    Spouse name: Not on file   Number of children: Not on file   Years of education: Not on file   Highest education level: Not on file  Occupational History   Not on file  Tobacco Use   Smoking status: Former    Types: Cigarettes    Quit date: 04/29/1973    Years since quitting: 49.1   Smokeless tobacco: Not on file  Vaping Use   Vaping Use: Every day   Substances: CBD  Substance and Sexual Activity   Alcohol use: Yes    Alcohol/week: 3.0 standard drinks of alcohol    Types: 3 Glasses of wine per week   Drug use: Never   Sexual activity: Not on file  Other Topics Concern   Not on file  Social History Narrative   Not on file   Social Determinants of Health   Financial Resource Strain: Not on file  Food Insecurity: Not on file  Transportation Needs: Not on file  Physical Activity: Not on file  Stress: Not on file  Social Connections: Not on file  Intimate Partner Violence: Not on file    FAMILY HISTORY: No reported family history of known blood disorders or cancers.  ALLERGIES:  has No Known Allergies.  MEDICATIONS:  Current Outpatient Medications  Medication Sig Dispense Refill   Ascorbic Acid (VITAMIN C) 1000 MG tablet Take 1,000 mg by mouth daily.     Ashwagandha 500 MG  CAPS Take 500 mg by mouth daily.     aspirin 81 MG chewable tablet Chew 1 tablet (81 mg total) by mouth 2 (two) times daily. 60 tablet 0   atorvastatin (LIPITOR) 10 MG tablet Take by mouth.     Calcium Citrate-Vitamin D (CALCIUM + D PO) Take 1 tablet by mouth daily.     cholecalciferol (VITAMIN D3) 25 MCG (1000 UNIT) tablet Take 1,000 Units by mouth daily.     Flaxseed, Linseed, (FLAXSEED OIL) 1000 MG CAPS Take 2,000 mg by mouth daily.     L-Arginine 500 MG CAPS Take 500 mg by mouth daily.     methocarbamol (ROBAXIN) 500 MG tablet Take 1 tablet (500 mg total) by mouth every 8 (eight) hours as needed for muscle spasms. (Patient not taking: Reported on 08/20/2021) 30 tablet 0   milk thistle  175 MG tablet Take 175 mg by mouth daily.     Multiple Vitamins-Minerals (MULTIVITAMIN WITH MINERALS) tablet Take 1 tablet by mouth daily.     olmesartan-hydrochlorothiazide (BENICAR HCT) 20-12.5 MG tablet Take 1 tablet by mouth daily.     OVER THE COUNTER MEDICATION Apply 1 application topically 2 (two) times daily as needed (pain relief). CBD Oil with Emu Oil 500 mg     oxyCODONE (OXY IR/ROXICODONE) 5 MG immediate release tablet Take 1-2 tablets (5-10 mg total) by mouth every 4 (four) hours as needed for moderate pain (pain score 4-6). (Patient not taking: Reported on 08/20/2021) 30 tablet 0   oxyCODONE (OXY IR/ROXICODONE) 5 MG immediate release tablet 1 po q 4 hrs prn pain (Patient not taking: Reported on 08/20/2021) 45 tablet 0   tadalafil (CIALIS) 5 MG tablet Take 5 mg by mouth daily as needed for erectile dysfunction.     Vitamin A 2400 MCG (8000 UT) CAPS Take 8,000 Units by mouth daily.     zinc gluconate 50 MG tablet Take 50 mg by mouth daily.     No current facility-administered medications for this visit.    REVIEW OF SYSTEMS:   10 Point review of Systems was done is negative except as noted above.  PHYSICAL EXAMINATION: ECOG PERFORMANCE STATUS: 1  . Vitals:   05/27/22 0855  BP: (!) 145/83  Pulse: 73  Resp: 20  Temp: 97.7 F (36.5 C)  SpO2: 98%   Filed Weights   05/27/22 0855  Weight: 245 lb 6.4 oz (111.3 kg)  .Body mass index is 30.67 kg/m. NAD GENERAL:alert, in no acute distress and comfortable SKIN: no acute rashes, no significant lesions EYES: conjunctiva are pink and non-injected, sclera anicteric NECK: supple, no JVD LYMPH:  no palpable lymphadenopathy in the cervical, axillary or inguinal regions LUNGS: clear to auscultation b/l with normal respiratory effort HEART: regular rate & rhythm ABDOMEN:  normoactive bowel sounds , non tender, not distended. Extremity: no pedal edema PSYCH: alert & oriented x 3 with fluent speech NEURO: no focal motor/sensory  deficits  LABORATORY DATA:  I have reviewed the data as listed     Latest Ref Rng & Units 05/27/2022    8:29 AM 01/12/2022    6:34 PM 12/21/2021   11:35 AM  CBC  WBC 4.0 - 10.5 K/uL 41.6  33.5  30.9   Hemoglobin 13.0 - 17.0 g/dL 13.0  13.3  13.0   Hematocrit 39.0 - 52.0 % 40.3  44.5  40.5   Platelets 150 - 400 K/uL 161  174  186    .CBC    Component Value Date/Time  WBC 41.6 (H) 05/27/2022 0829   WBC 33.5 (H) 01/12/2022 1834   RBC 4.41 05/27/2022 0829   HGB 13.0 05/27/2022 0829   HCT 40.3 05/27/2022 0829   PLT 161 05/27/2022 0829   MCV 91.4 05/27/2022 0829   MCH 29.5 05/27/2022 0829   MCHC 32.3 05/27/2022 0829   RDW 12.6 05/27/2022 0829   LYMPHSABS PENDING 05/27/2022 0829   MONOABS PENDING 05/27/2022 0829   EOSABS PENDING 05/27/2022 0829   BASOSABS PENDING 05/27/2022 0829     .    Latest Ref Rng & Units 05/27/2022    8:29 AM 01/12/2022    6:34 PM 12/21/2021   11:35 AM  CMP  Glucose 70 - 99 mg/dL 92  90  92   BUN 8 - 23 mg/dL '14  11  12   '$ Creatinine 0.61 - 1.24 mg/dL 1.15  0.99  0.85   Sodium 135 - 145 mmol/L 139  140  138   Potassium 3.5 - 5.1 mmol/L 4.0  4.5  4.3   Chloride 98 - 111 mmol/L 106  107  107   CO2 22 - 32 mmol/L '29  27  27   '$ Calcium 8.9 - 10.3 mg/dL 9.5  9.5  9.7   Total Protein 6.5 - 8.1 g/dL 6.7   6.9   Total Bilirubin 0.3 - 1.2 mg/dL 1.1   1.2   Alkaline Phos 38 - 126 U/L 83   85   AST 15 - 41 U/L 17   22   ALT 0 - 44 U/L 10   13    . Lab Results  Component Value Date   LDH 115 05/27/2022    Component     Latest Ref Rng & Units 05/09/2021  DAT, complement      NEG  DAT, IgG      NEG . . .  Ferritin     24 - 336 ng/mL 193  Vitamin B12     180 - 914 pg/mL 479  Haptoglobin     32 - 363 mg/dL 193  HIV Screen 4th Generation wRfx     Non Reactive Non Reactive  HCV Ab     NON REACTIVE NON REACTIVE  Hep B Core Total Ab     NON REACTIVE NON REACTIVE  Hepatitis B Surface Ag     NON REACTIVE NON REACTIVE  LDH     98 - 192 U/L 133       RADIOGRAPHIC STUDIES: I have personally reviewed the radiological images as listed and agreed with the findings in the report.  PET/CT 05/17/2021:IMPRESSION: 1. Hypermetabolic prominent/mildly enlarged right pelvic lymph nodes, with activity greater than liver ([place] 4).   2. Low level metabolic activity within prominent bilateral axillary, retroperitoneal and left pelvic sidewall lymph nodes.   3.  No splenomegaly.   4. Asymmetric nodular hyperactivity on the right side of the prostate gland with a max SUV of 7.26 without discrete CT correlate, nonspecific but prostate carcinoma not excluded, recommend correlation with PSA and urology consult.   5. Hypermetabolic focus in the left hemipelvis along the gonadal vessels with a max SUV of 6.75, without discrete CT correlate and possibly vascular in etiology, attention on follow-up imaging suggested.   6. Hypermetabolic hyperplasia of the tonsils slightly asymmetric to the right with a max SUV of 5.1 common likely reactive.   7. Bilateral symmetric diffuse parotid gland hypermetabolism with max SUV of 4.9 on the right and 4.7 on the left without focal hypermetabolism  or CT correlate, likely inflammatory     Electronically Signed   By: Dahlia Bailiff M.D.   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 Incidentally noted on labs in the context of preoperative labs prior to his recent right TKA. CLL FISH panel with 11 q. and 13 q. deletions  2) incidental note of FDG avid prostate lesions. Patient has a family history of prostate  cancer. He notes that he has been followed by urology previously for elevated PSA levels and he has had prostate biopsy previously  PLAN: -Discussed lab results from today, 05/27/2022, with the patient. CBC shows WBC of 41.6k, but stable overall. No anemia or thrombocytopenia. CMP is stable.  LDH wnl -Recommended influenza vaccine, COVID-19 vaccine, and other age appropriate vaccines.  -He has no significant lab changes,constitutional symptoms for any other signs or symptoms of significant CLL progression at this time. - no indication to initiate CLL treatment at this time.   FOLLOW-UP: RTC with Dr Irene Limbo with labs in 6 months  The total time spent in the appointment was 21 minutes* .  All of the patient's questions were answered with apparent satisfaction. The patient knows to call the clinic with any problems, questions or concerns.   Sullivan Lone MD MS AAHIVMS [facility] Hematology/Oncology Physician [facility]  .*Total Encounter Time as defined by the Centers for Medicare and Medicaid Services includes, in addition to the face-to-face time of a patient visit (documented in the note above) non-face-to-face time: obtaining and reviewing outside history, ordering and reviewing medications, tests or procedures, care coordination (communications with other health care professionals or caregivers) and documentation in the medical record.   I, Cleda Mccreedy, am acting as a Education administrator for Sullivan Lone, MD.  .I have reviewed the above documentation for accuracy and completeness, and I agree with the above. Brunetta Genera MD

## 2022-09-12 ENCOUNTER — Encounter: Payer: Self-pay | Admitting: Orthopedic Surgery

## 2022-09-12 ENCOUNTER — Other Ambulatory Visit (INDEPENDENT_AMBULATORY_CARE_PROVIDER_SITE_OTHER): Payer: 59

## 2022-09-12 ENCOUNTER — Ambulatory Visit: Payer: 59 | Admitting: Orthopedic Surgery

## 2022-09-12 DIAGNOSIS — M25561 Pain in right knee: Secondary | ICD-10-CM

## 2022-09-12 DIAGNOSIS — M25551 Pain in right hip: Secondary | ICD-10-CM

## 2022-09-12 NOTE — Progress Notes (Signed)
Office Visit Note   Patient: Kevin Patterson           Date of Birth: 24-Oct-1955           MRN: [id number] Visit Date: 09/12/2022 Requested by: Brayton El, PA-C [address] [place],  [place] [address] PCP: Drosinis, Leonia Reader, PA-C  Subjective: Chief Complaint  Patient presents with   Right Hip - Pain    HPI: Kevin Patterson is a 67 y.o. male who presents to the office reporting right hip pain.  He has had pain for several years.  He does a lot of driving.  At times it is hard for him to lift the leg because of hip pain.  He did have an injection done on May 3 into the right hip which has given him good relief.  The pain otherwise does wake him from sleep at night he does report groin pain also reports some back pain.  He does have a history of back surgery about 3 years ago.  Has chronic lymphocytic leukemia which she does not take medications for.  Had knee replacement done in 1222 on the right-hand side.  He is fused to the sacrum.  Does report walking and standing endurance limitation.                ROS: All systems reviewed are negative as they relate to the chief complaint within the history of present illness.  Patient denies fevers or chills.  Assessment & Plan: Visit Diagnoses:  1. Pain in right hip   2. Right knee pain, unspecified chronicity     Plan: Impression is right hip arthritis which appears moderate on plain radiographs on the right-hand side.  Positive response to multiple injections in the right hip joint.  Symptoms are severe.  He is fused to the sacrum.  This would be a complex primary hip replacement.  Refer to Dr. Roda Shutters for further management.  Follow-Up Instructions: No follow-ups on file.   Orders:  Orders Placed This Encounter  Procedures   XR HIP UNILAT W OR W/O PELVIS 2-3 VIEWS RIGHT   XR KNEE 3 VIEW RIGHT   Ambulatory referral to Orthopedic Surgery   No orders of the defined types were placed in this encounter.     Procedures: No  procedures performed   Clinical Data: No additional findings.  Objective: Vital Signs: There were no vitals taken for this visit.  Physical Exam:  Constitutional: Patient appears well-developed HEENT:  Head: Normocephalic Eyes:EOM are normal Neck: Normal range of motion Cardiovascular: Normal rate Pulmonary/chest: Effort normal Neurologic: Patient is alert Skin: Skin is warm Psychiatric: Patient has normal mood and affect  Ortho Exam: Ortho exam demonstrates normal gait and alignment.  Has good hip flexion abduction adduction strength.  Mild pain on the right with internal/external rotation of the right hip.  No trochanteric tenderness no nerve root tension signs present.  Right knee has excellent range of motion with no real joint line tenderness.  I think he is having some medial sided pain referred from the hip.  Collaterals are stable to varus valgus stress at 0 30 and 90 degrees.  Specialty Comments:  No specialty comments available.  Imaging: XR HIP UNILAT W OR W/O PELVIS 2-3 VIEWS RIGHT  Result Date: 09/12/2022 AP pelvis lateral right hip radiographs reviewed.  Lumbosacral fusion is noted.  Moderate arthritis with joint space narrowing is present in the right hip with no acute fracture.  Similar changes noted in the  left hip.  XR KNEE 3 VIEW RIGHT  Result Date: 09/12/2022 AP lateral merchant radiographs right knee reviewed.  Press-fit prosthesis in good position alignment with no complicating features.  No fracture.  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 No pertinent  family history.  Past Surgical History:  Procedure Laterality Date   APPENDECTOMY     BACK SURGERY     JOINT REPLACEMENT     left knee   KNEE ARTHROTOMY  12/28/1968   open cleanout   TONSILLECTOMY     TOTAL KNEE ARTHROPLASTY Left 03/13/2015   Procedure: LEFT TOTAL KNEE ARTHROPLASTY;  Surgeon: Salvatore Marvel, MD;  Location: [facility] OR;  Service: Orthopedics;  Laterality: Left;   TOTAL KNEE ARTHROPLASTY Right 04/10/2021   Procedure: RIGHT TOTAL KNEE ARTHROPLASTY;  Surgeon: Cammy Copa, MD;  Location[facility] OR;  Service: Orthopedics;  Laterality: Right;   Social History   Occupational History   Not on file  Tobacco Use   Smoking status: Former    Types: Cigarettes    Quit date: 04/29/1973    Years since quitting: 49.4   Smokeless tobacco: Not on file  Vaping Use   Vaping Use: Every day   Substances: CBD  Substance and Sexual Activity   Alcohol use: Yes    Alcohol/week: 3.0 standard drinks of alcohol    Types: 3 Glasses of wine per week   Drug use: Never   Sexual activity: Not on file

## 2022-09-19 ENCOUNTER — Ambulatory Visit: Payer: 59 | Admitting: Orthopaedic Surgery

## 2022-09-20 ENCOUNTER — Ambulatory Visit: Payer: 59 | Admitting: Orthopaedic Surgery

## 2022-09-20 ENCOUNTER — Encounter: Payer: Self-pay | Admitting: Orthopaedic Surgery

## 2022-09-20 DIAGNOSIS — M1611 Unilateral primary osteoarthritis, right hip: Secondary | ICD-10-CM | POA: Diagnosis not present

## 2022-09-20 NOTE — Progress Notes (Signed)
Office Visit Note   Patient: Kevin Patterson           Date of Birth: [birth year]           MRN: [id number] Visit Date: 09/20/2022              Requested by: Kevin Copa, MD [address] [place],  [place] [address] PCP: Kevin Lawman Leonia Reader, PA-C   Assessment & Plan: Visit Diagnoses:  1. Primary osteoarthritis of right hip     Plan: Impression is severe right hip degenerative joint disease secondary to Osteoarthritis.  Imaging shows bone on bone joint space narrowing.  At this point, conservative treatments fail to provide any significant relief and the pain is severely affecting ADLs and quality of life.  Based on treatment options, the patient has elected to move forward with a hip replacement.  We have discussed the surgical risks that include but are not limited to infection, DVT, leg length discrepancy, numbness, tingling, incomplete relief of pain.  Recovery and prognosis were also reviewed.    Due to lumbar fusion we will plan for dual mobility bearing.  Will have to postpone surgery until August 1 at the earliest due to the injection.  Current anticoagulants: aspirin 81 mg daily Postop anticoagulation: Aspirin 81 mg Diabetic: No  Prior DVT/PE: No Tobacco use: No Clearances needed for surgery: None Anticipate discharge dispo: home   Follow-Up Instructions: No follow-ups on file.   Orders:  No orders of the defined types were placed in this encounter.  No orders of the defined types were placed in this encounter.     Procedures: No procedures performed   Clinical Data: No additional findings.   Subjective: Chief Complaint  Patient presents with   Right Hip - Pain    HPI Kevin Patterson is a very pleasant 67 year old gentleman referral from Kevin Patterson for surgical consultation for right total hip replacement.  He has had severe pain over the last 2 years and has had 4 separate intra-articular injections.  Unfortunately the most recent ones last only about a  couple weeks.  His most recent injection was in 08/28/2022.  He has had severe pain and interruption in quality of life and daily activities as a result.  He is status post lumbar to sacrum fusion about 3 years ago by Dr. Noel Gerold.  He is status post bilateral total knee replacements.  Kevin Patterson did the right one in 2022.  Patient has chronic and severe right hip and groin pain.  Review of Systems  Constitutional: Negative.   HENT: Negative.    Eyes: Negative.   Respiratory: Negative.    Cardiovascular: Negative.   Gastrointestinal: Negative.   Endocrine: Negative.   Genitourinary: Negative.   Skin: Negative.   Allergic/Immunologic: Negative.   Neurological: Negative.   Hematological: Negative.   Psychiatric/Behavioral: Negative.    All other systems reviewed and are negative.    Objective: Vital Signs: There were no vitals taken for this visit.  Physical Exam Vitals and nursing note reviewed.  Constitutional:      Appearance: He is well-developed.  HENT:     Head: Normocephalic and atraumatic.  Eyes:     Pupils: Pupils are equal, round, and reactive to light.  Pulmonary:     Effort: Pulmonary effort is normal.  Abdominal:     Palpations: Abdomen is soft.  Musculoskeletal:        General: Normal range of motion.     Cervical back: Neck supple.  Skin:  General: Skin is warm.  Neurological:     Mental Status: He is alert and oriented to person, place, and time.  Psychiatric:        Behavior: Behavior normal.        Thought Content: Thought content normal.        Judgment: Judgment normal.     Ortho Exam Exam nation right hip shows severe pain with movement of the hip joint.  Pain with flexion of the hip past 90 degrees. Specialty Comments:  No specialty comments available.  Imaging: No results found.   PMFS History: Patient Active Problem List   Diagnosis Date Noted   Arthritis of right knee    S/P total knee replacement, right 04/10/2021   DJD (degenerative  joint disease) of knee 03/13/2015   Past Medical History:  Diagnosis Date   Arthritis    CLL (chronic lymphocytic leukemia) ([facility])    Cyst of kidney, acquired    history of , monitored   Hypertension     No family history on file.  Past Surgical History:  Procedure Laterality Date   APPENDECTOMY     BACK SURGERY     JOINT REPLACEMENT     left knee   KNEE ARTHROTOMY  12/28/1968   open cleanout   TONSILLECTOMY     TOTAL KNEE ARTHROPLASTY Left 03/13/2015   Procedure: LEFT TOTAL KNEE ARTHROPLASTY;  Surgeon: Salvatore Marvel, MD;  Location: [facility] OR;  Service: Orthopedics;  Laterality: Left;   TOTAL KNEE ARTHROPLASTY Right 04/10/2021   Procedure: RIGHT TOTAL KNEE ARTHROPLASTY;  Surgeon: Kevin Copa, MD;  Location: [facility] OR;  Service: Orthopedics;  Laterality: Right;   Social History   Occupational History   Not on file  Tobacco Use   Smoking status: Former    Types: Cigarettes    Quit date: 04/29/1973    Years since quitting: 49.4   Smokeless tobacco: Not on file  Vaping Use   Vaping Use: Every day   Substances: CBD  Substance and Sexual Activity   Alcohol use: Yes    Alcohol/week: 3.0 standard drinks of alcohol    Types: 3 Glasses of wine per week   Drug use: Never   Sexual activity: Not on file

## 2022-10-28 ENCOUNTER — Telehealth: Payer: Self-pay | Admitting: Hematology

## 2022-10-28 NOTE — Telephone Encounter (Signed)
Left patient a message regarding appointment times/dates

## 2022-11-22 ENCOUNTER — Ambulatory Visit: Payer: 59

## 2022-11-25 ENCOUNTER — Other Ambulatory Visit: Payer: 59

## 2022-11-25 ENCOUNTER — Ambulatory Visit: Payer: 59

## 2022-11-25 ENCOUNTER — Ambulatory Visit: Payer: 59 | Admitting: Hematology

## 2022-11-25 DIAGNOSIS — M1611 Unilateral primary osteoarthritis, right hip: Secondary | ICD-10-CM

## 2022-11-28 ENCOUNTER — Encounter (HOSPITAL_COMMUNITY): Payer: Self-pay

## 2022-11-28 NOTE — Progress Notes (Signed)
Surgical Instructions   Your procedure is scheduled on Monday December 09, 2022. Report to [facility] Main Entrance "A" at 7:10 A.M., then check in with the Admitting office. Any questions or running late day of surgery: call [phone]  Questions prior to your surgery date: call [phone], Monday-Friday, 8am-4pm. If you experience any cold or flu symptoms such as cough, fever, chills, shortness of breath, etc. between now and your scheduled surgery, please notify us at the above number.     Remember:  Do not eat after midnight the night before your surgery  You may drink clear liquids until 6:40 the morning of your surgery.   Clear liquids allowed are: Water, Non-Citrus Juices (without pulp), Carbonated Beverages, Clear Tea, Black Coffee Only (NO MILK, CREAM OR POWDERED CREAMER of any kind), and Gatorade.  Patient Instructions  The night before surgery:  No food after midnight. ONLY clear liquids after midnight  The day of surgery (if you do NOT have diabetes):  Drink ONE (1) Pre-Surgery Clear Ensure by 6:40 the morning of surgery. Drink in one sitting. Do not sip.  This drink was given to you during your hospital  pre-op appointment visit.  Nothing else to drink after completing the  Pre-Surgery Clear Ensure.         If you have questions, please contact your surgeon's office.     Take these medicines the morning of surgery with A SIP OF WATER  olopatadine (PATANOL)  pravastatin (PRAVACHOL)   May take these medicines IF NEEDED: sodium chloride (OCEAN) 0.65 % SOLN nasal spray    Follow your surgeon's instructions on when to stop Aspirin.  If no instructions were given by your surgeon then you will need to call the office to get those instructions.     One week prior to surgery, STOP taking any Aleve, Naproxen, Ibuprofen, Motrin, Advil, Goody's, BC's, all herbal medications, fish oil, and non-prescription vitamins.                     Do NOT Smoke (Tobacco/Vaping)  for 24 hours prior to your procedure.  If you use a CPAP at night, you may bring your mask/headgear for your overnight stay.   You will be asked to remove any contacts, glasses, piercing's, hearing aid's, dentures/partials prior to surgery. Please bring cases for these items if needed.    Patients discharged the day of surgery will not be allowed to drive home, and someone needs to stay with them for 24 hours.  SURGICAL WAITING ROOM VISITATION Patients may have no more than 2 support people in the waiting area - these visitors may rotate.   Pre-op nurse will coordinate an appropriate time for 1 ADULT support person, who may not rotate, to accompany patient in pre-op.  Children under the age of 67 must have an adult with them who is not the patient and must remain in the main waiting area with an adult.  If the patient needs to stay at the hospital during part of their recovery, the visitor guidelines for inpatient rooms apply.  Please refer to the [facility] website for the visitor guidelines for any additional information.   If you received a COVID test during your pre-op visit  it is requested that you wear a mask when out in public, stay away from anyone that may not be feeling well and notify your surgeon if you develop symptoms. If you have been in contact with anyone that has tested positive in the  last 10 days please notify you surgeon.      Pre-operative 5 CHG Bathing Instructions   You can play a key role in reducing the risk of infection after surgery. Your skin needs to be as free of germs as possible. You can reduce the number of germs on your skin by washing with CHG (chlorhexidine gluconate) soap before surgery. CHG is an antiseptic soap that kills germs and continues to kill germs even after washing.   DO NOT use if you have an allergy to chlorhexidine/CHG or antibacterial soaps. If your skin becomes reddened or irritated, stop using the CHG and notify one of our RNs at  [phone].   Please shower with the CHG soap starting 4 days before surgery using the following schedule:     Please keep in mind the following:  DO NOT shave, including legs and underarms, starting the day of your first shower.   You may shave your face at any point before/day of surgery.  Place clean sheets on your bed the day you start using CHG soap. Use a clean washcloth (not used since being washed) for each shower. DO NOT sleep with pets once you start using the CHG.   CHG Shower Instructions:  If you choose to wash your hair and private area, wash first with your normal shampoo/soap.  After you use shampoo/soap, rinse your hair and body thoroughly to remove shampoo/soap residue.  Turn the water OFF and apply about 3 tablespoons (45 ml) of CHG soap to a CLEAN washcloth.  Apply CHG soap ONLY FROM YOUR NECK DOWN TO YOUR TOES (washing for 3-5 minutes)  DO NOT use CHG soap on face, private areas, open wounds, or sores.  Pay special attention to the area where your surgery is being performed.  If you are having back surgery, having someone wash your back for you may be helpful. Wait 2 minutes after CHG soap is applied, then you may rinse off the CHG soap.  Pat dry with a clean towel  Put on clean clothes/pajamas   If you choose to wear lotion, please use ONLY the CHG-compatible lotions on the back of this paper.   Additional instructions for the day of surgery: DO NOT APPLY any lotions, deodorants or cologne.    Do not bring valuables to the hospital. [facility] is not responsible for any belongings/valuables. Put on clean/comfortable clothes.  Please brush your teeth.  Ask your nurse before applying any prescription medications to the skin.     CHG Compatible Lotions   Aveeno Moisturizing lotion  Cetaphil Moisturizing Cream  Cetaphil Moisturizing Lotion  Clairol Herbal Essence Moisturizing Lotion, Dry Skin  Clairol Herbal Essence Moisturizing Lotion, Extra Dry Skin   Clairol Herbal Essence Moisturizing Lotion, Normal Skin  Curel Age Defying Therapeutic Moisturizing Lotion with Alpha Hydroxy  Curel Extreme Care Body Lotion  Curel Soothing Hands Moisturizing Hand Lotion  Curel Therapeutic Moisturizing Cream, Fragrance-Free  Curel Therapeutic Moisturizing Lotion, Fragrance-Free  Curel Therapeutic Moisturizing Lotion, Original Formula  Eucerin Daily Replenishing Lotion  Eucerin Dry Skin Therapy Plus Alpha Hydroxy Crme  Eucerin Dry Skin Therapy Plus Alpha Hydroxy Lotion  Eucerin Original Crme  Eucerin Original Lotion  Eucerin Plus Crme Eucerin Plus Lotion  Eucerin TriLipid Replenishing Lotion  Keri Anti-Bacterial Hand Lotion  Keri Deep Conditioning Original Lotion Dry Skin Formula Softly Scented  Keri Deep Conditioning Original Lotion, Fragrance Free Sensitive Skin Formula  Keri Lotion Fast Absorbing Fragrance Free Sensitive Skin Formula  Keri Lotion Fast Absorbing Softly  Scented Dry Skin Formula  Keri Original Lotion  Keri Skin Renewal Lotion Keri Silky Smooth Lotion  Keri Silky Smooth Sensitive Skin Lotion  Nivea Body Creamy Conditioning Oil  Nivea Body Extra Enriched Lotion  Nivea Body Original Lotion  Nivea Body Sheer Moisturizing Lotion Nivea Crme  Nivea Skin Firming Lotion  NutraDerm 30 Skin Lotion  NutraDerm Skin Lotion  NutraDerm Therapeutic Skin Cream  NutraDerm Therapeutic Skin Lotion  ProShield Protective Hand Cream  Provon moisturizing lotion  Please read over the following fact sheets that you were given.

## 2022-11-29 ENCOUNTER — Other Ambulatory Visit: Payer: Self-pay

## 2022-11-29 ENCOUNTER — Encounter (HOSPITAL_COMMUNITY)
Admission: RE | Admit: 2022-11-29 | Discharge: 2022-11-29 | Disposition: A | Discharge status: Home / Self Care | Payer: 59 | Source: Ambulatory Visit | Attending: Orthopaedic Surgery | Admitting: Orthopaedic Surgery

## 2022-11-29 ENCOUNTER — Encounter (HOSPITAL_COMMUNITY): Payer: Self-pay

## 2022-11-29 VITALS — BP 124/80 | HR 71 | Temp 98.3°F | Resp 17 | Ht 75.0 in | Wt 235.0 lb

## 2022-11-29 DIAGNOSIS — E785 Hyperlipidemia, unspecified: Secondary | ICD-10-CM | POA: Diagnosis not present

## 2022-11-29 DIAGNOSIS — M1611 Unilateral primary osteoarthritis, right hip: Secondary | ICD-10-CM | POA: Diagnosis not present

## 2022-11-29 DIAGNOSIS — I517 Cardiomegaly: Secondary | ICD-10-CM | POA: Diagnosis not present

## 2022-11-29 DIAGNOSIS — Z01818 Encounter for other preprocedural examination: Secondary | ICD-10-CM | POA: Insufficient documentation

## 2022-11-29 DIAGNOSIS — R9431 Abnormal electrocardiogram [ECG] [EKG]: Secondary | ICD-10-CM | POA: Diagnosis not present

## 2022-11-29 DIAGNOSIS — C9110 Chronic lymphocytic leukemia of B-cell type not having achieved remission: Secondary | ICD-10-CM | POA: Insufficient documentation

## 2022-11-29 DIAGNOSIS — I10 Essential (primary) hypertension: Secondary | ICD-10-CM | POA: Insufficient documentation

## 2022-11-29 DIAGNOSIS — M1711 Unilateral primary osteoarthritis, right knee: Secondary | ICD-10-CM | POA: Insufficient documentation

## 2022-11-29 HISTORY — DX: Personal history of urinary calculi: Z87.442

## 2022-11-29 HISTORY — DX: Pure hypercholesterolemia, unspecified: E78.00

## 2022-11-29 LAB — SURGICAL PCR SCREEN
MRSA, PCR: NEGATIVE
Staphylococcus aureus: NEGATIVE

## 2022-11-29 LAB — CBC
HCT: 42.7 % (ref 39.0–52.0)
Hemoglobin: 13.1 g/dL (ref 13.0–17.0)
MCH: 29 pg (ref 26.0–34.0)
MCHC: 30.7 g/dL (ref 30.0–36.0)
MCV: 94.5 fL (ref 80.0–100.0)
Platelets: 190 10*3/uL (ref 150–400)
RBC: 4.52 MIL/uL (ref 4.22–5.81)
RDW: 13 % (ref 11.5–15.5)
WBC: 48.8 10*3/uL — ABNORMAL HIGH (ref 4.0–10.5)
nRBC: 0 % (ref 0.0–0.2)

## 2022-11-29 LAB — TYPE AND SCREEN
ABO/RH(D): O POS
Antibody Screen: NEGATIVE

## 2022-11-29 LAB — BASIC METABOLIC PANEL
Anion gap: 6 (ref 5–15)
BUN: 14 mg/dL (ref 8–23)
CO2: 26 mmol/L (ref 22–32)
Calcium: 9 mg/dL (ref 8.9–10.3)
Chloride: 104 mmol/L (ref 98–111)
Creatinine, Ser: 0.96 mg/dL (ref 0.61–1.24)
GFR, Estimated: >60 mL/min (ref >60)
Glucose, Bld: 99 mg/dL (ref 70–99)
Potassium: 6.1 mmol/L — ABNORMAL HIGH (ref 3.5–5.1)
Sodium: 136 mmol/L (ref 135–145)

## 2022-11-29 NOTE — Progress Notes (Signed)
PCP - [facility] in [facility] Cardiologist - n/a  Chest x-ray - n/a EKG - 11/29/22 Stress Test - 01/07/17 CE ECHO - n/a Cardiac Cath - n/a  ICD Pacemaker/Loop - n/a  Sleep Study -  n/a CPAP - none  Diabetes Type - n/a  Aspirin Instructions: Last dose was on 11/24/22.  ERAS: Clear liquids til 6:40 AM DOS.  Please complete your PRE-SURGERY ENSURE that was provided to you by 6:40 AM the morning of surgery.  Please, if able, drink it in one setting. DO NOT SIP.  THIS WILL BE THE LAST THING YOU DRINK.  Anesthesia review: Yes  STOP now taking any Aspirin (unless otherwise instructed by your surgeon), Aleve, Naproxen, Ibuprofen, Motrin, Advil, Goody's, BC's, all herbal medications, fish oil, and all vitamins.   Coronavirus Screening Do you have any of the following symptoms:  Cough yes/no: No Fever (>100.38F)  yes/no: No Runny nose Yes - allergies related Sore throat yes/no: No Difficulty breathing/shortness of breath  yes/no: No  Have you traveled in the last 14 days and where? yes/no: No  Patient verbalized understanding of instructions that were given to them at the PAT appointment. Patient was also instructed that they will need to review over the PAT instructions again at home before surgery.

## 2022-11-29 NOTE — Progress Notes (Signed)
Surgical Instructions   Your procedure is scheduled on Monday December 09, 2022. Report to [facility] Main Entrance "A" at 7:40 A.M., then check in with the Admitting office. Any questions or running late day of surgery: call [phone]  Questions prior to your surgery date: call [phone], Monday-Friday, 8am-4pm. If you experience any cold or flu symptoms such as cough, fever, chills, shortness of breath, etc. between now and your scheduled surgery, please notify us at the above number.     Remember:  Do not eat after midnight the night before your surgery-Sun  You may drink clear liquids until 6:40 the morning of your surgery-Mon. Clear liquids allowed are: Water, Non-Citrus Juices (without pulp), Carbonated Beverages, Clear Tea, Black Coffee Only (NO MILK, CREAM OR POWDERED CREAMER of any kind), and Gatorade.  Patient Instructions  The night before surgery:  No food after midnight. ONLY clear liquids after midnight  The day of surgery (if you do NOT have diabetes):  Drink ONE (1) Pre-Surgery Clear Ensure by 6:40 the morning of surgery. Drink in one sitting. Do not sip.  This drink was given to you during your hospital  pre-op appointment visit.  Nothing else to drink after completing the  Pre-Surgery Clear Ensure.         If you have questions, please contact your surgeon's office.     Take these medicines the morning of surgery with A SIP OF WATER  olopatadine (PATANOL)  pravastatin (PRAVACHOL)  sodium chloride (OCEAN) 0.65 % SOLN nasal spray if needed    Follow your surgeon's instructions on when to stop Aspirin.  If no instructions were given by your surgeon then you will need to call the office to get those instructions.     One week prior to surgery, STOP taking any Aleve, Naproxen, Ibuprofen, Motrin, Advil, Goody's, BC's, all herbal medications, fish oil, and non-prescription vitamins.                     Do NOT Smoke (Tobacco/Vaping) for 24 hours prior to  your procedure.  If you use a CPAP at night, you may bring your mask/headgear for your overnight stay.   You will be asked to remove any contacts, glasses, piercing's, hearing aid's, dentures/partials prior to surgery. Please bring cases for these items if needed.    Patients discharged the day of surgery will not be allowed to drive home, and someone needs to stay with them for 24 hours.  SURGICAL WAITING ROOM VISITATION Patients may have no more than 2 support people in the waiting area - these visitors may rotate.   Pre-op nurse will coordinate an appropriate time for 1 ADULT support person, who may not rotate, to accompany patient in pre-op.  Children under the age of 37 must have an adult with them who is not the patient and must remain in the main waiting area with an adult.  If the patient needs to stay at the hospital during part of their recovery, the visitor guidelines for inpatient rooms apply.  Please refer to the [facility] website for the visitor guidelines for any additional information.   If you received a COVID test during your pre-op visit  it is requested that you wear a mask when out in public, stay away from anyone that may not be feeling well and notify your surgeon if you develop symptoms. If you have been in contact with anyone that has tested positive in the last 10 days please notify you surgeon.  Pre-operative 5 CHG Bathing Instructions   You can play a key role in reducing the risk of infection after surgery. Your skin needs to be as free of germs as possible. You can reduce the number of germs on your skin by washing with CHG (chlorhexidine gluconate) soap before surgery. CHG is an antiseptic soap that kills germs and continues to kill germs even after washing.   DO NOT use if you have an allergy to chlorhexidine/CHG or antibacterial soaps. If your skin becomes reddened or irritated, stop using the CHG and notify one of our RNs at [phone].   Please  shower with the CHG soap starting 4 days before surgery using the following schedule:     Please keep in mind the following:  DO NOT shave, including legs and underarms, starting the day of your first shower.   You may shave your face at any point before/day of surgery.  Place clean sheets on your bed the day you start using CHG soap. Use a clean washcloth (not used since being washed) for each shower. DO NOT sleep with pets once you start using the CHG.   CHG Shower Instructions:  If you choose to wash your hair and private area, wash first with your normal shampoo/soap.  After you use shampoo/soap, rinse your hair and body thoroughly to remove shampoo/soap residue.  Turn the water OFF and apply about 3 tablespoons (45 ml) of CHG soap to a CLEAN washcloth.  Apply CHG soap ONLY FROM YOUR NECK DOWN TO YOUR TOES (washing for 3-5 minutes)  DO NOT use CHG soap on face, private areas, open wounds, or sores.  Pay special attention to the area where your surgery is being performed.  If you are having back surgery, having someone wash your back for you may be helpful. Wait 2 minutes after CHG soap is applied, then you may rinse off the CHG soap.  Pat dry with a clean towel  Put on clean clothes/pajamas   If you choose to wear lotion, please use ONLY the CHG-compatible lotions on the back of this paper.   Additional instructions for the day of surgery: DO NOT APPLY any lotions, deodorants or cologne.    Do not bring valuables to the hospital. [facility] is not responsible for any belongings/valuables. Put on clean/comfortable clothes.  Please brush your teeth.  Ask your nurse before applying any prescription medications to the skin.     CHG Compatible Lotions   Aveeno Moisturizing lotion  Cetaphil Moisturizing Cream  Cetaphil Moisturizing Lotion  Clairol Herbal Essence Moisturizing Lotion, Dry Skin  Clairol Herbal Essence Moisturizing Lotion, Extra Dry Skin  Clairol Herbal Essence  Moisturizing Lotion, Normal Skin  Curel Age Defying Therapeutic Moisturizing Lotion with Alpha Hydroxy  Curel Extreme Care Body Lotion  Curel Soothing Hands Moisturizing Hand Lotion  Curel Therapeutic Moisturizing Cream, Fragrance-Free  Curel Therapeutic Moisturizing Lotion, Fragrance-Free  Curel Therapeutic Moisturizing Lotion, Original Formula  Eucerin Daily Replenishing Lotion  Eucerin Dry Skin Therapy Plus Alpha Hydroxy Crme  Eucerin Dry Skin Therapy Plus Alpha Hydroxy Lotion  Eucerin Original Crme  Eucerin Original Lotion  Eucerin Plus Crme Eucerin Plus Lotion  Eucerin TriLipid Replenishing Lotion  Keri Anti-Bacterial Hand Lotion  Keri Deep Conditioning Original Lotion Dry Skin Formula Softly Scented  Keri Deep Conditioning Original Lotion, Fragrance Free Sensitive Skin Formula  Keri Lotion Fast Absorbing Fragrance Free Sensitive Skin Formula  Keri Lotion Fast Absorbing Softly Scented Dry Skin Formula  Keri Original Lotion  Keri Skin Renewal  Lotion [employer] Smooth Lotion  Keri Silky Smooth Sensitive Skin Lotion  Nivea Body Creamy Conditioning Patent examiner Moisturizing Lotion Nivea Crme  Nivea Skin Firming Lotion  NutraDerm 30 Skin Lotion  NutraDerm Skin Lotion  NutraDerm Therapeutic Skin Cream  NutraDerm Therapeutic Skin Lotion  ProShield Protective Hand Cream  Provon moisturizing lotion  Please read over the following fact sheets that you were given.

## 2022-12-02 ENCOUNTER — Other Ambulatory Visit: Payer: Self-pay | Admitting: Physician Assistant

## 2022-12-02 ENCOUNTER — Encounter (HOSPITAL_COMMUNITY): Payer: Self-pay | Admitting: Anesthesiology

## 2022-12-02 ENCOUNTER — Encounter (HOSPITAL_COMMUNITY): Payer: Self-pay | Admitting: Physician Assistant

## 2022-12-02 MED ORDER — ASPIRIN 81 MG PO TBEC: 81.0000 mg | DELAYED_RELEASE_TABLET | Freq: Two times a day (BID) | ORAL | 0 refills | Status: DC

## 2022-12-02 MED ORDER — METHOCARBAMOL 750 MG PO TABS: 750.0000 mg | ORAL_TABLET | Freq: Two times a day (BID) | ORAL | 2 refills | Status: DC | PRN

## 2022-12-02 MED ORDER — OXYCODONE-ACETAMINOPHEN 5-325 MG PO TABS: 1.0000 | ORAL_TABLET | Freq: Four times a day (QID) | ORAL | 0 refills | Status: DC | PRN

## 2022-12-02 MED ORDER — DOCUSATE SODIUM 100 MG PO CAPS
100.0000 mg | ORAL_CAPSULE | Freq: Every day | ORAL | 2 refills | Status: DC | PRN
Start: 2022-12-02 — End: 2023-01-02

## 2022-12-02 MED ORDER — ONDANSETRON HCL 4 MG PO TABS: 4.0000 mg | ORAL_TABLET | Freq: Three times a day (TID) | ORAL | 0 refills | Status: DC | PRN

## 2022-12-02 NOTE — Progress Notes (Signed)
Anesthesia Chart Review:  67 year old male with pertinent history including HTN, HLD, CLL.  Patient was followed by oncologist Dr. Candise Che for newly diagnosed CLL.  Last seen 05/27/2022, stable at that time, no anemia or thrombocytopenia, no indication to initiate CLL treatment.  Preop labs reviewed, notable for hyperkalemia potassium 6.1.  EKG without acute changes.  Consider pseudohyperkalemia secondary to CLL.  Discussed with anesthesiologist Dr. Renold Don.  He advised to recheck with i-STAT on day of surgery.  Order placed.   Kevin Patterson [facility]/Anesthesiology Phone [phone] 12/02/2022 12:30 PM

## 2022-12-02 NOTE — Anesthesia Preprocedure Evaluation (Deleted)
Anesthesia Evaluation    Airway        Dental   Pulmonary former smoker          Cardiovascular hypertension,      Neuro/Psych    GI/Hepatic   Endo/Other    Renal/GU      Musculoskeletal   Abdominal   Peds  Hematology   Anesthesia Other Findings   Reproductive/Obstetrics                              Anesthesia Physical Anesthesia Plan  ASA:   Anesthesia Plan:    Post-op Pain Management:    Induction:   PONV Risk Score and Plan:   Airway Management Planned:   Additional Equipment:   Intra-op Plan:   Post-operative Plan:   Informed Consent:   Plan Discussed with:   Anesthesia Plan Comments: (PAT note by Antionette Poles, PA-C: 67 year old male with pertinent history including HTN, HLD, CLL.  Patient was followed by oncologist Dr. Candise Che for newly diagnosed CLL.  Last seen 05/27/2022, stable at that time, no anemia or thrombocytopenia, no indication to initiate CLL treatment.  Preop labs reviewed, notable for hyperkalemia potassium 6.1.  EKG without acute changes.  Consider pseudohyperkalemia secondary to CLL.  Discussed with anesthesiologist Dr. Renold Don.  He advised to recheck with i-STAT on day of surgery.  Order placed.   )         Anesthesia Quick Evaluation

## 2022-12-02 NOTE — Progress Notes (Signed)
Hyperkalemia 6.1.  scheduled for tha  Monday.  Can we treat this?

## 2022-12-06 ENCOUNTER — Ambulatory Visit: Payer: 59

## 2022-12-06 MED ORDER — TRANEXAMIC ACID 1000 MG/10ML IV SOLN
2000.0000 mg | INTRAVENOUS | Status: DC
  Filled 2022-12-06: qty 20

## 2022-12-07 ENCOUNTER — Ambulatory Visit
Admission: EM | Admit: 2022-12-07 | Discharge: 2022-12-07 | Disposition: A | Discharge status: Home / Self Care | Payer: 59 | Attending: Internal Medicine | Admitting: Internal Medicine

## 2022-12-07 DIAGNOSIS — J329 Chronic sinusitis, unspecified: Secondary | ICD-10-CM

## 2022-12-07 DIAGNOSIS — B9689 Other specified bacterial agents as the cause of diseases classified elsewhere: Secondary | ICD-10-CM | POA: Diagnosis not present

## 2022-12-07 MED ORDER — AMOXICILLIN-POT CLAVULANATE 875-125 MG PO TABS
1.0000 | ORAL_TABLET | Freq: Two times a day (BID) | ORAL | 0 refills | Status: DC
Start: 2022-12-07 — End: 2023-01-02

## 2022-12-07 NOTE — ED Triage Notes (Signed)
Pt presents to UC w/ c/o cough, yellow nasal drainage x1 week. Low grade fever today. Cough is worse in the morning. BC powder helped some.

## 2022-12-07 NOTE — Discharge Instructions (Signed)
Start Augmentin twice daily for 7 days.  Continue Flonase and nasal rinses as tolerated.  Follow-up with your PCP if your symptoms do not improve.  Please go to the ER for any worsening symptoms.  I hope you feel better soon!

## 2022-12-07 NOTE — ED Provider Notes (Signed)
[facility]    CSN: [id number] Arrival date & time: 12/07/22  1354      History   Chief Complaint No chief complaint on file.   HPI Kevin Patterson is a 67 y.o. male  presents for evaluation of URI symptoms for 7 days. Patient reports associated symptoms of sinus pressure/pain with purulent nasal discharge.  States today he developed a low-grade fever.  Does endorse a cough secondary to postnasal drip.  Denies N/V/D, sore throat, body aches, shortness of breath. Patient does not eat have a hx of asthma.  He does smoke cigars.  No known sick contacts.  Pt has taken BC powder and Flonase OTC for symptoms. Pt has no other concerns at this time.   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   DJD (degenerative joint disease) of knee 03/13/2015    Past Surgical History:  Procedure Laterality Date   APPENDECTOMY     BACK SURGERY     COLONOSCOPY     x several   JOINT REPLACEMENT Left    left knee   KNEE ARTHROTOMY Bilateral 12/28/1968   open cleanout   TONSILLECTOMY     TONSILLECTOMY AND ADENOIDECTOMY     TOTAL KNEE ARTHROPLASTY Left 03/13/2015   Procedure: LEFT TOTAL KNEE ARTHROPLASTY;  Surgeon: Salvatore Marvel, MD;  Location: [facility] OR;  Service: Orthopedics;  Laterality: Left;   TOTAL KNEE ARTHROPLASTY Right 04/10/2021   Procedure: RIGHT TOTAL KNEE ARTHROPLASTY;  Surgeon: Cammy Copa, MD;  Location: [facility] OR;  Service: Orthopedics;  Laterality: Right;   WISDOM TOOTH EXTRACTION         Home Medications    Prior to Admission medications   Medication Sig Start Date End Date Taking? Authorizing Provider   amoxicillin-clavulanate (AUGMENTIN) 875-125 MG tablet Take 1 tablet by mouth every 12 (twelve) hours. 12/07/22  Yes Radford Pax, NP  Ascorbic Acid (VITAMIN C) 1000 MG tablet Take 1,000 mg by mouth daily.    [provider]  Ashwagandha 500 MG CAPS Take 500 mg by mouth daily.    [provider]  aspirin 81 MG chewable tablet Chew 1 tablet (81 mg total) by mouth 2 (two) times daily. Patient taking differently: Chew 81 mg by mouth daily. 04/11/21   Magnant, Joycie Peek, PA-C  aspirin EC 81 MG tablet Take 1 tablet (81 mg total) by mouth 2 (two) times daily. To be taken after surgery to prevent blood clots 12/02/22 12/02/23  Cristie Hem, PA-C  Aspirin-Caffeine (BC FAST PAIN RELIEF PO) Take 1 packet by mouth daily as needed (pain).    [provider]  Calcium Citrate-Vitamin D (CALCIUM + D PO) Take 1 tablet by mouth daily.    [provider]  cholecalciferol (VITAMIN D3) 25 MCG (1000 UNIT) tablet Take 1,000 Units by mouth daily.    [provider]  docusate sodium (COLACE) 100 MG capsule Take 1 capsule (100 mg total) by mouth daily as needed. 12/02/22 12/02/23  Cristie Hem, PA-C  Flaxseed, Linseed, (FLAXSEED OIL) 1000 MG  CAPS Take 2,000 mg by mouth daily.    [provider]  methocarbamol (ROBAXIN-750) 750 MG tablet Take 1 tablet (750 mg total) by mouth 2 (two) times daily as needed for muscle spasms. 12/02/22   Cristie Hem, PA-C  milk thistle 175 MG tablet Take 175 mg by mouth daily.    [provider]  olmesartan-hydrochlorothiazide (BENICAR HCT) 40-12.5 MG tablet Take 1 tablet by mouth daily.    [provider]  olopatadine (PATANOL) 0.1 % ophthalmic solution Place 1 drop into both eyes 2 (two) times daily as needed for allergies. 10/03/22 10/03/23  [provider]  ondansetron (ZOFRAN) 4 MG tablet Take 1 tablet (4 mg total) by mouth every 8 (eight) hours as needed for nausea or vomiting. 12/02/22   Cristie Hem, PA-C   oxyCODONE-acetaminophen (PERCOCET) 5-325 MG tablet Take 1-2 tablets by mouth every 6 (six) hours as needed. To be taken after surgery 12/02/22   Cristie Hem, PA-C  pravastatin (PRAVACHOL) 40 MG tablet Take 40 mg by mouth daily.    [provider]  sodium chloride (OCEAN) 0.65 % SOLN nasal spray Place 1 spray into both nostrils as needed for congestion.    [provider]  tadalafil (CIALIS) 5 MG tablet Take 5 mg by mouth daily as needed for erectile dysfunction.    [provider]  Vitamin A 2400 MCG (8000 UT) CAPS Take 8,000 Units by mouth daily.    [provider]  vitamin B-12 (CYANOCOBALAMIN) 500 MCG tablet Take 500 mcg by mouth daily.    [provider]  zinc gluconate 50 MG tablet Take 50 mg by mouth daily.    [provider]    Family History History reviewed. No pertinent family history.  Social History Social History   Tobacco Use   Smoking status: Former    Current packs/day: 0.00    Types: Cigarettes    Quit date: 04/29/1973    Years since quitting: 49.6   Smokeless tobacco: Current  Vaping Use   Vaping status: Every Day   Substances: THC, CBD  Substance Use Topics   Alcohol use: Not Currently    Alcohol/week: 3.0 standard drinks of alcohol    Types: 3 Standard drinks or equivalent per week   Drug use: Yes    Comment: CBD oil and gummies     Allergies   Other   Review of Systems Review of Systems  Constitutional:  Positive for fever.  HENT:  Positive for congestion, postnasal drip and sinus pressure.      Physical Exam Triage Vital Signs ED Triage Vitals  Encounter Vitals Group     BP 12/07/22 1406 119/75     Systolic BP Percentile --      Diastolic BP Percentile --      Pulse Rate 12/07/22 1406 73     Resp 12/07/22 1406 16     Temp 12/07/22 1406 99.1 F (37.3 C)     Temp Source 12/07/22 1406 Oral     SpO2 12/07/22 1406 94 %     Weight --      Height --      Head Circumference --      Peak  Flow --      Pain Score 12/07/22 1409 0     Pain Loc --      Pain Education --      Exclude from Growth Chart --    No data found.  Updated Vital Signs BP  119/75 (BP Location: Right Arm)   Pulse 73   Temp 99.1 F (37.3 C) (Oral)   Resp 16   SpO2 94%   Visual Acuity Right Eye Distance:   Left Eye Distance:   Bilateral Distance:    Right Eye Near:   Left Eye Near:    Bilateral Near:     Physical Exam Vitals and nursing note reviewed.  Constitutional:      General: He is not in acute distress.    Appearance: Normal appearance. He is well-developed. He is not ill-appearing or toxic-appearing.  HENT:     Head: Normocephalic and atraumatic.     Right Ear: Tympanic membrane and ear canal normal.     Left Ear: Tympanic membrane and ear canal normal.     Nose: Congestion present.     Right Turbinates: Enlarged and swollen.     Right Sinus: Maxillary sinus tenderness present. No frontal sinus tenderness.     Left Sinus: No maxillary sinus tenderness or frontal sinus tenderness.     Mouth/Throat:     Mouth: Mucous membranes are moist.     Pharynx: No oropharyngeal exudate or posterior oropharyngeal erythema.  Eyes:     Conjunctiva/sclera: Conjunctivae normal.     Pupils: Pupils are equal, round, and reactive to light.  Cardiovascular:     Rate and Rhythm: Normal rate and regular rhythm.     Heart sounds: Normal heart sounds. No murmur heard. Pulmonary:     Effort: Pulmonary effort is normal. No respiratory distress.     Breath sounds: Normal breath sounds.  Abdominal:     Palpations: Abdomen is soft.     Tenderness: There is no abdominal tenderness.  Musculoskeletal:        General: No swelling.     Cervical back: Normal range of motion and neck supple.  Lymphadenopathy:     Cervical: No cervical adenopathy.  Skin:    General: Skin is warm and dry.     Capillary Refill: Capillary refill takes less than 2 seconds.  Neurological:     General: No focal deficit present.      Mental Status: He is alert and oriented to person, place, and time.  Psychiatric:        Mood and Affect: Mood normal.        Behavior: Behavior normal.      UC Treatments / Results  Labs (all labs ordered are listed, but only abnormal results are displayed) Labs Reviewed - No data to display  EKG   Radiology No results found.  Procedures Procedures (including critical care time)  Medications Ordered in UC Medications - No data to display  Initial Impression / Assessment and Plan / UC Course  I have reviewed the triage vital signs and the nursing notes.  Pertinent labs & imaging results that were available during my care of the patient were reviewed by me and considered in my medical decision making (see chart for details).     Reviewed exam and symptoms with patient.  No red flags.  Start Augmentin for sinusitis.  Continue Flonase.  Nasal rinses as tolerated.  PCP follow-up if symptoms do not improve.  ER precautions reviewed and patient verbalized understanding. Final Clinical Impressions(s) / UC Diagnoses   Final diagnoses:  Bacterial sinusitis     Discharge Instructions      Start Augmentin twice daily for 7 days.  Continue Flonase and nasal rinses as tolerated.  Follow-up with your PCP if your symptoms do  not improve.  Please go to the ER for any worsening symptoms.  I hope you feel better soon!    ED Prescriptions     Medication Sig Dispense Auth. Provider   amoxicillin-clavulanate (AUGMENTIN) 875-125 MG tablet Take 1 tablet by mouth every 12 (twelve) hours. 14 tablet Radford Pax, NP      PDMP not reviewed this encounter.   Radford Pax, NP 12/07/22 [phone]

## 2022-12-08 DIAGNOSIS — M1611 Unilateral primary osteoarthritis, right hip: Secondary | ICD-10-CM | POA: Insufficient documentation

## 2022-12-08 NOTE — Progress Notes (Signed)
I called the patient tonight regarding patient's recent visit to the ED for sinus infection with fever.  He has been placed on 1 week of augmentin.  Given these findings, I have recommended postponing surgery until the sinus infection is fully treated and that he does not have a relapse of symptoms.  My surgery scheduler Chrystine Oiler will reach out to him to get him rescheduled ASAP.   Patient was very understanding.  Mayra Reel, MD [facility] 8:46 PM

## 2022-12-09 ENCOUNTER — Ambulatory Visit (HOSPITAL_COMMUNITY): Admission: RE | Admit: 2022-12-09 | Payer: 59 | Source: Home / Self Care | Admitting: Orthopaedic Surgery

## 2022-12-09 ENCOUNTER — Encounter (HOSPITAL_COMMUNITY): Admission: RE | Payer: Self-pay | Source: Home / Self Care

## 2022-12-09 DIAGNOSIS — M1611 Unilateral primary osteoarthritis, right hip: Secondary | ICD-10-CM | POA: Insufficient documentation

## 2022-12-09 DIAGNOSIS — C9110 Chronic lymphocytic leukemia of B-cell type not having achieved remission: Secondary | ICD-10-CM

## 2022-12-09 DIAGNOSIS — Z01818 Encounter for other preprocedural examination: Secondary | ICD-10-CM

## 2022-12-09 SURGERY — ARTHROPLASTY, HIP, TOTAL, ANTERIOR APPROACH
Anesthesia: Spinal | Site: Hip | Laterality: Right

## 2022-12-12 IMAGING — MR MR PELVIS W/O CM
5 series · 35 of 48 positions shown · non-contrast
Comparison: 12/20/2020 radiographs and CT abdomen/pelvis from
10/15/2016

CLINICAL DATA: Right hip and groin pain

EXAM:
MRI PELVIS WITHOUT CONTRAST
TECHNIQUE: Multiplanar multisequence MR imaging of the pelvis was performed. No
intravenous contrast was administered.

[Series 3: STIR · coronal · 4.0mm · 0.70mm/px · 6 of 31 slices shown]
[im 1/31]
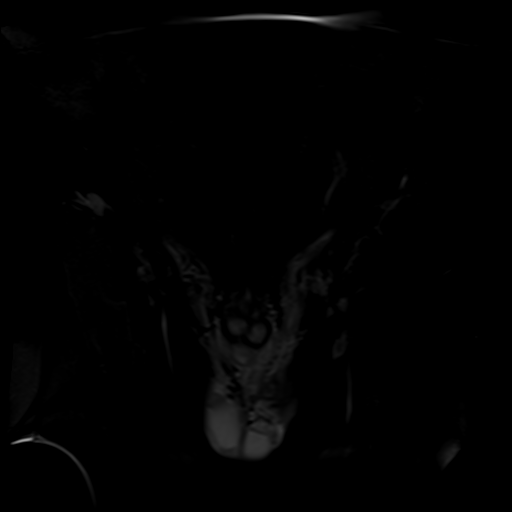
[im 7/31]
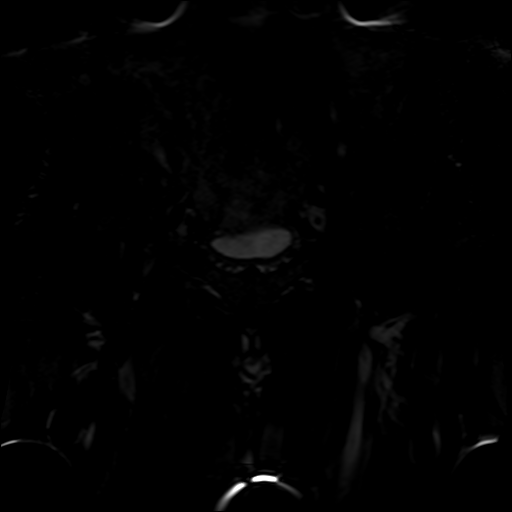
[im 13/31]
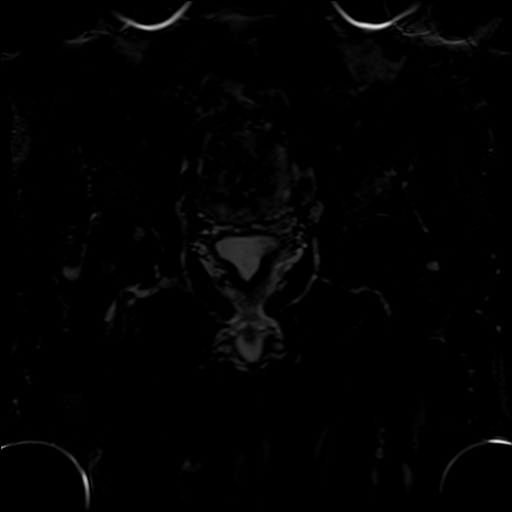
[im 19/31]
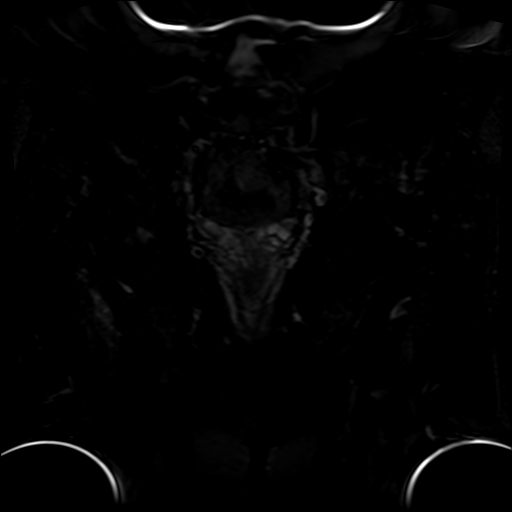
[im 25/31]
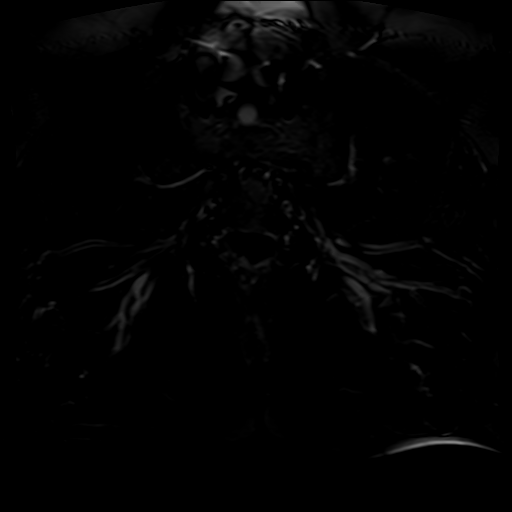
[im 31/31]
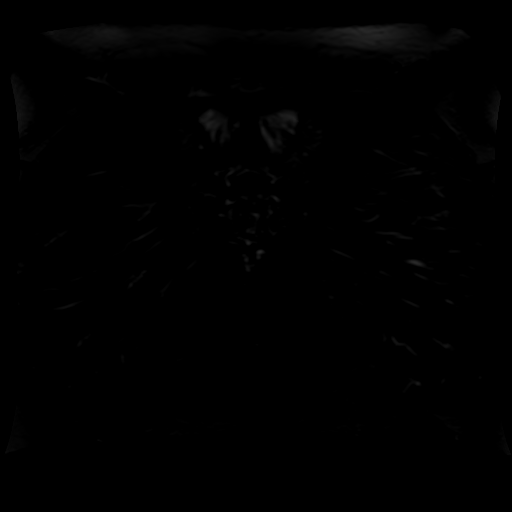

[Series 4: T1 · coronal · 4.0mm · 1.41mm/px · 7 of 31 slices shown (1 of 2)]
[im 1/31]
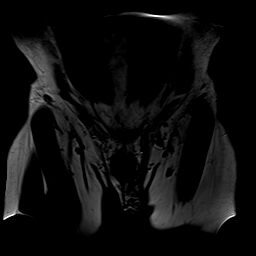
[im 6/31]
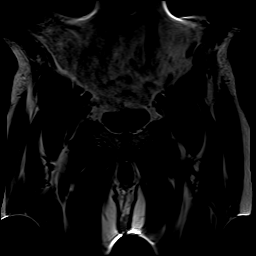
[im 11/31]
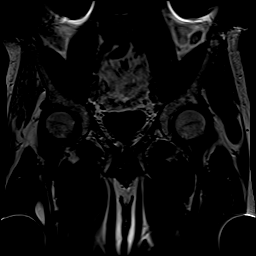
[im 16/31]
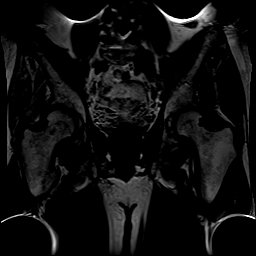
[im 21/31]
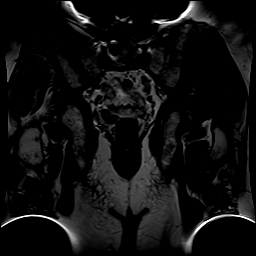
[im 26/31]
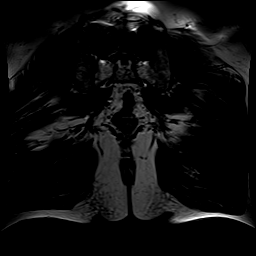
[im 31/31]
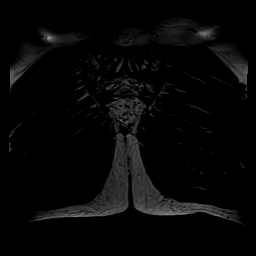

[Series 5: T1 · axial · 4.0mm · 1.41mm/px · z∈[-88,+187]mm · 9 of 52 slices shown (2 of 2)]
[im 1/52]
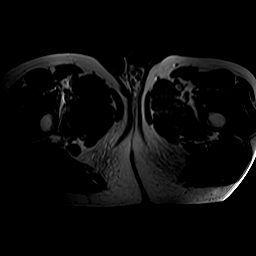
[im 10/52]
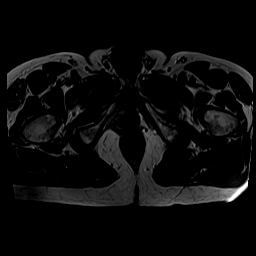
[im 14/52]
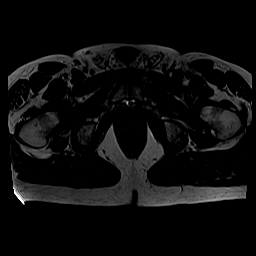
[im 24/52]
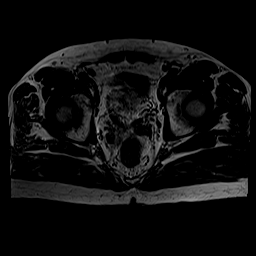
[im 28/52]
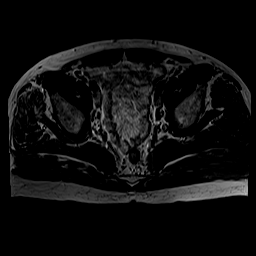
[im 38/52]
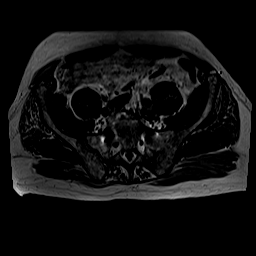
[im 42/52]
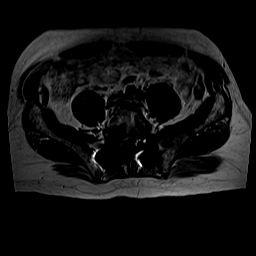
[im 47/52]
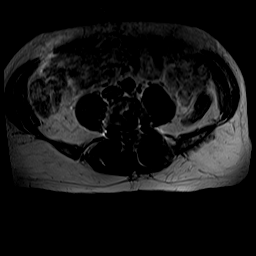
[im 52/52]
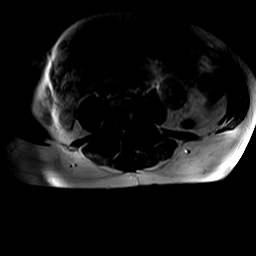

[Series 6: T2 fat-sat · axial · 4.0mm · 1.41mm/px · z∈[-88,+187]mm · 9 of 52 slices shown (1 of 2)]
[im 1/52]
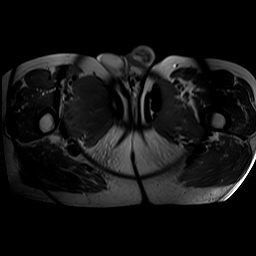
[im 10/52]
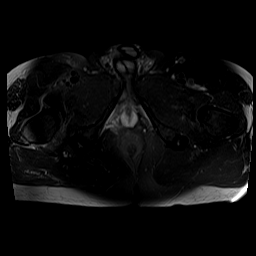
[im 14/52]
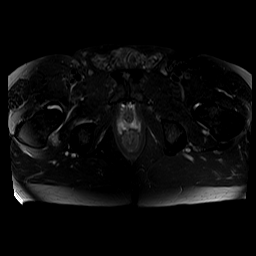
[im 24/52]
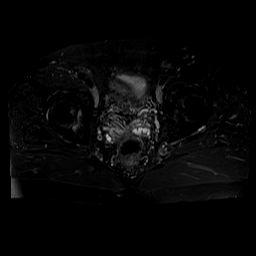
[im 28/52]
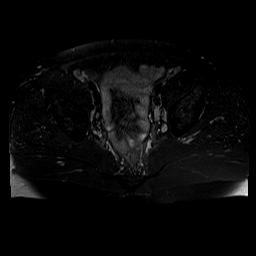
[im 38/52]
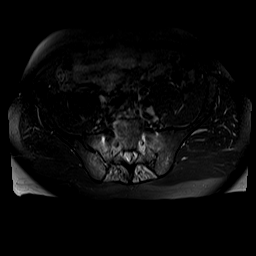
[im 42/52]
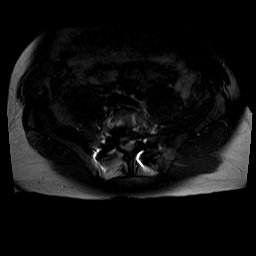
[im 47/52]
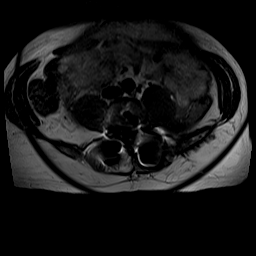
[im 52/52]
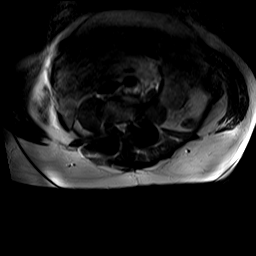

[Series 7: T2 fat-sat · sagittal · 5.0mm · 0.55mm/px · 4 of 49 slices shown (2 of 2)]
[im 1/49]
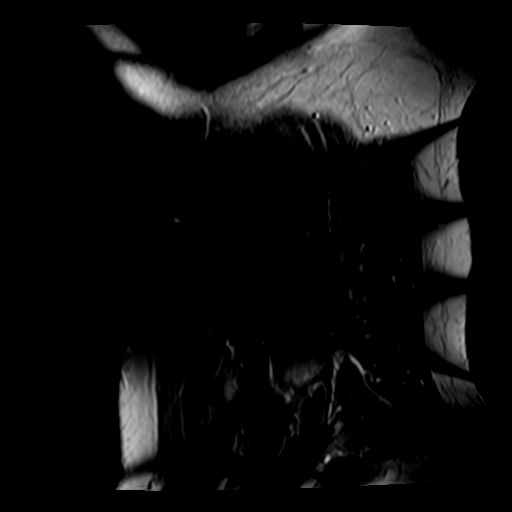
[im 10/49]
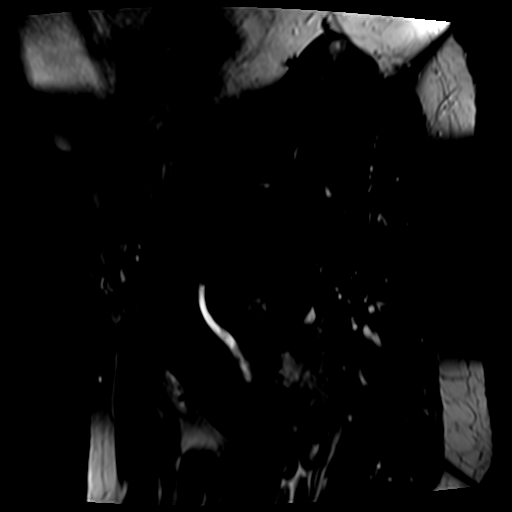
[im 15/49]
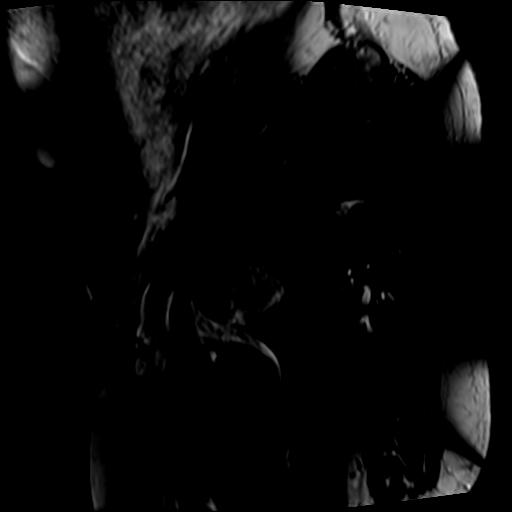
[im 20/49]
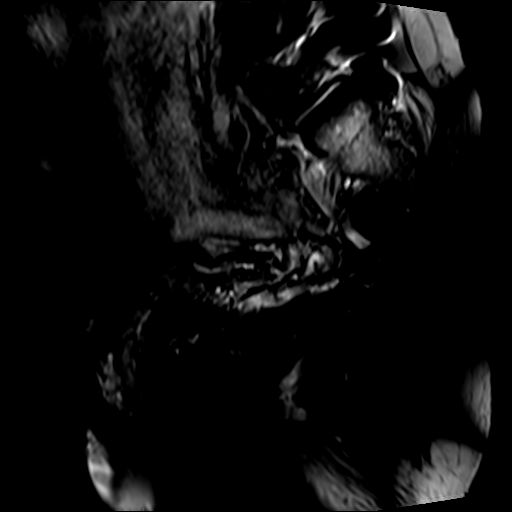

[35 of 48 positions shown; findings below may reference images not displayed]

FINDINGS: Bones:Approximately 1 cm non-fragmented osteochondral lesion in the
right femoral head just cephalad to the fovea for example on images
18-19 of series 3, no fragmentation identified.

A small rim sclerotic proximal metaphyseal lesion measuring 1.5 cm
in diameter in the right proximal femur had a similar appearance on
the CT pelvis from 10/15/2016 and is probably an enchondroma or
similar benign incidental metaphyseal lesion. No surrounding marrow
edema or substantial progression from 8074.

2.0 by 0.7 by 1.5 cm focus of subcortical marrow edema along the
posterosuperior acetabulum on the right, image 13 series 7, likely a
degenerative lesion.

Posterolateral rod and pedicle screw fixation in the lumbar spine
and sacrum.

1.1 cm bone island in the left iliac crest on image 11 series 6, no
change from 10/15/2016.

Fused left sacroiliac joint

Small degenerative subcortical cystic lesion or erosion
posterolaterally along the left femoral head. Mild spurring of both
femoral heads.

Articular cartilage and labrum

Articular cartilage: Mild axial and craniocaudad loss of articular
cartilage thickness bilaterally.

Labrum: Poorly assessed on today's large field of view images, no
paralabral cyst is identified on either side.

Joint or bursal effusion

Joint effusion: Small right hip joint effusion

Bursae: No regional bursitis.

Muscles and tendons

Trace edema tracks between the right ischium and the adjacent right
obturator externus muscle for example on image 17 series 7.

Other findings

No supplemental non-categorized findings.
IMPRESSION: 1. Right hip effusion. Trace nearby edema tracks between the right
ischium and the right obturator externus muscle.
2. Degenerative findings in the hips with small subcortical foci of
edema, spurring, and mild chondral thinning. No fracture is
identified.
3. Fused left sacroiliac joint.
4. Chronically stable metaphyseal lesion in the right proximal
femur, probably an enchondroma or similar benign lesion, no change
in size from 10/15/2016. There is also a small left iliac bone
island which is unchanged from 8074 as well.

## 2022-12-19 ENCOUNTER — Ambulatory Visit: Payer: 59 | Admitting: Hematology

## 2022-12-19 ENCOUNTER — Other Ambulatory Visit: Payer: 59

## 2022-12-23 DIAGNOSIS — M1611 Unilateral primary osteoarthritis, right hip: Secondary | ICD-10-CM

## 2022-12-24 ENCOUNTER — Other Ambulatory Visit: Payer: Self-pay | Admitting: Physician Assistant

## 2022-12-24 ENCOUNTER — Encounter: Payer: 59 | Admitting: Physician Assistant

## 2022-12-24 MED ORDER — ASPIRIN 81 MG PO TBEC: 81.0000 mg | DELAYED_RELEASE_TABLET | Freq: Two times a day (BID) | ORAL | 0 refills | Status: AC

## 2022-12-24 MED ORDER — ONDANSETRON 4 MG PO TBDP: 4.0000 mg | ORAL_TABLET | Freq: Three times a day (TID) | ORAL | 0 refills | Status: DC | PRN

## 2022-12-24 MED ORDER — METHOCARBAMOL 750 MG PO TABS: 750.0000 mg | ORAL_TABLET | Freq: Two times a day (BID) | ORAL | 2 refills | Status: AC | PRN

## 2022-12-24 MED ORDER — OXYCODONE-ACETAMINOPHEN 5-325 MG PO TABS: 1.0000 | ORAL_TABLET | Freq: Four times a day (QID) | ORAL | 0 refills | Status: DC | PRN

## 2022-12-24 MED ORDER — DOCUSATE SODIUM 100 MG PO CAPS: 100.0000 mg | ORAL_CAPSULE | Freq: Every day | ORAL | 2 refills | Status: AC | PRN

## 2022-12-31 ENCOUNTER — Other Ambulatory Visit: Payer: Self-pay

## 2022-12-31 ENCOUNTER — Encounter (HOSPITAL_COMMUNITY): Payer: Self-pay | Admitting: Orthopaedic Surgery

## 2022-12-31 MED ORDER — TRANEXAMIC ACID 1000 MG/10ML IV SOLN
2000.0000 mg | INTRAVENOUS | Status: DC
  Filled 2022-12-31: qty 20

## 2022-12-31 NOTE — Anesthesia Preprocedure Evaluation (Addendum)
Anesthesia Evaluation  Patient identified by MRN, date of birth, ID band Patient awake    Reviewed: Allergy & Precautions, NPO status , Patient's Chart, lab work & pertinent test results  Airway Mallampati: II  TM Distance: >3 FB Neck ROM: Full    Dental no notable dental hx.    Pulmonary Patient abstained from smoking., former smoker   Pulmonary exam normal        Cardiovascular hypertension, Pt. on medications Normal cardiovascular exam     Neuro/Psych negative neurological ROS  negative psych ROS   GI/Hepatic negative GI ROS, Neg liver ROS,,,  Endo/Other  negative endocrine ROS    Renal/GU Renal disease     Musculoskeletal  (+) Arthritis ,    Abdominal   Peds  Hematology CLL (chronic lymphocytic leukemia)   Anesthesia Other Findings right hip osteoarthritis  Reproductive/Obstetrics                              Anesthesia Physical Anesthesia Plan  ASA: 2  Anesthesia Plan: Spinal   Post-op Pain Management:    Induction: Intravenous  PONV Risk Score and Plan: 1 and Ondansetron, Dexamethasone, Propofol infusion, Midazolam and Treatment may vary due to age or medical condition  Airway Management Planned: Simple Face Mask  Additional Equipment:   Intra-op Plan:   Post-operative Plan:   Informed Consent: I have reviewed the patients History and Physical, chart, labs and discussed the procedure including the risks, benefits and alternatives for the proposed anesthesia with the patient or authorized representative who has indicated his/her understanding and acceptance.     Dental advisory given  Plan Discussed with: CRNA and Surgeon  Anesthesia Plan Comments: (PAT note by Antionette Poles, PA-C 11/29/22. Surgery subsequently postponed due to URI. Pt reports he has now been asymptomatic for > 2 weeks.  )         Anesthesia Quick Evaluation

## 2022-12-31 NOTE — Progress Notes (Signed)
SDW call  Patient was given pre-op instructions over the phone. Patient verbalized understanding of instructions provided.     PCP - [facility] in [facility] Cardiologist - denies Pulmonary:    PPM/ICD - denies Device Orders - n/a Rep Notified - n/a   Chest x-ray - n/a EKG -  11/29/2022 Stress Test - ECHO -  Cardiac Cath -   Sleep Study/sleep apnea/CPAP: denies  Non-diabetic   Blood Thinner Instructions: denies Aspirin Instructions: denies   ERAS Protcol - Yes clear fluids until 0935  COVID TEST- n/a    Anesthesia review: No. Reviewed by Anesthesia 11/29/2022   Patient denies shortness of breath, fever, cough and chest pain over the phone call  Your procedure is scheduled on Wednesday January 01, 2023  Report to [facility] Main Entrance "A" at  1005  A.M., then check in with the Admitting office.  Call this number if you have problems the morning of surgery:  [phone]   If you have any questions prior to your surgery date call [phone]: Open Monday-Friday 8am-4pm If you experience any cold or flu symptoms such as cough, fever, chills, shortness of breath, etc. between now and your scheduled surgery, please notify us at the above number     Remember:  Do not eat after midnight the night before your surgery  You may drink clear liquids until  0935   the morning of your surgery.   Clear liquids allowed are: Water, Non-Citrus Juices (without pulp), Carbonated Beverages, Clear Tea, Black Coffee ONLY (NO MILK, CREAM OR POWDERED CREAMER of any kind), and Gatorade   Take these medicines the morning of surgery with A SIP OF WATER:  Pravachol  As needed: Patanol eye drops, Percocet, ocean nasal spray  As of today, STOP taking any Aspirin (unless otherwise instructed by your surgeon) Aleve, Naproxen, Ibuprofen, Motrin, Advil, Goody's, BC's, all herbal medications, fish oil, and all vitamins.

## 2023-01-01 ENCOUNTER — Observation Stay (HOSPITAL_COMMUNITY)
Admission: RE | Admit: 2023-01-01 | Discharge: 2023-01-02 | Disposition: A | Discharge status: Home / Self Care | Payer: 59 | Attending: Orthopaedic Surgery | Admitting: Orthopaedic Surgery

## 2023-01-01 ENCOUNTER — Encounter (HOSPITAL_COMMUNITY): Payer: Self-pay | Admitting: Orthopaedic Surgery

## 2023-01-01 ENCOUNTER — Other Ambulatory Visit: Payer: Self-pay

## 2023-01-01 ENCOUNTER — Encounter (HOSPITAL_COMMUNITY)
Admission: RE | Disposition: A | Discharge status: Home / Self Care | Payer: Self-pay | Source: Home / Self Care | Attending: Orthopaedic Surgery

## 2023-01-01 ENCOUNTER — Ambulatory Visit (HOSPITAL_COMMUNITY): Payer: 59 | Admitting: Vascular Surgery

## 2023-01-01 ENCOUNTER — Ambulatory Visit (HOSPITAL_COMMUNITY): Payer: 59

## 2023-01-01 ENCOUNTER — Observation Stay (HOSPITAL_COMMUNITY): Payer: 59

## 2023-01-01 DIAGNOSIS — M1611 Unilateral primary osteoarthritis, right hip: Secondary | ICD-10-CM | POA: Diagnosis present

## 2023-01-01 DIAGNOSIS — I10 Essential (primary) hypertension: Secondary | ICD-10-CM | POA: Diagnosis not present

## 2023-01-01 DIAGNOSIS — Z87891 Personal history of nicotine dependence: Secondary | ICD-10-CM | POA: Insufficient documentation

## 2023-01-01 DIAGNOSIS — Z96653 Presence of artificial knee joint, bilateral: Secondary | ICD-10-CM | POA: Diagnosis not present

## 2023-01-01 DIAGNOSIS — Z79899 Other long term (current) drug therapy: Secondary | ICD-10-CM | POA: Diagnosis not present

## 2023-01-01 DIAGNOSIS — Z96641 Presence of right artificial hip joint: Secondary | ICD-10-CM

## 2023-01-01 DIAGNOSIS — Z7982 Long term (current) use of aspirin: Secondary | ICD-10-CM | POA: Insufficient documentation

## 2023-01-01 DIAGNOSIS — Z856 Personal history of leukemia: Secondary | ICD-10-CM | POA: Diagnosis not present

## 2023-01-01 HISTORY — PX: TOTAL HIP ARTHROPLASTY: SHX124

## 2023-01-01 LAB — BASIC METABOLIC PANEL
Anion gap: 10 (ref 5–15)
BUN: 9 mg/dL (ref 8–23)
CO2: 24 mmol/L (ref 22–32)
Calcium: 9.1 mg/dL (ref 8.9–10.3)
Chloride: 106 mmol/L (ref 98–111)
Creatinine, Ser: 0.99 mg/dL (ref 0.61–1.24)
GFR, Estimated: >60 mL/min (ref >60)
Glucose, Bld: 133 mg/dL — ABNORMAL HIGH (ref 70–99)
Potassium: 5.3 mmol/L — ABNORMAL HIGH (ref 3.5–5.1)
Sodium: 140 mmol/L (ref 135–145)

## 2023-01-01 LAB — CBC
HCT: 39 % (ref 39.0–52.0)
Hemoglobin: 12.1 g/dL — ABNORMAL LOW (ref 13.0–17.0)
MCH: 28.3 pg (ref 26.0–34.0)
MCHC: 31 g/dL (ref 30.0–36.0)
MCV: 91.1 fL (ref 80.0–100.0)
Platelets: 176 10*3/uL (ref 150–400)
RBC: 4.28 MIL/uL (ref 4.22–5.81)
RDW: 12.9 % (ref 11.5–15.5)
WBC: 41.5 10*3/uL — ABNORMAL HIGH (ref 4.0–10.5)
nRBC: 0 % (ref 0.0–0.2)

## 2023-01-01 LAB — SURGICAL PCR SCREEN
MRSA, PCR: NEGATIVE
Staphylococcus aureus: NEGATIVE

## 2023-01-01 SURGERY — ARTHROPLASTY, HIP, TOTAL, ANTERIOR APPROACH
Anesthesia: Spinal | Site: Hip | Laterality: Right

## 2023-01-01 MED ORDER — CHLORHEXIDINE GLUCONATE 0.12 % MT SOLN
OROMUCOSAL | Status: AC
  Administered 2023-01-01: 15 mL via OROMUCOSAL
  Filled 2023-01-01: qty 15

## 2023-01-01 MED ORDER — POVIDONE-IODINE 10 % EX SWAB
2.0000 | Freq: Once | CUTANEOUS | Status: AC
  Administered 2023-01-01: 2 via TOPICAL

## 2023-01-01 MED ORDER — ACETAMINOPHEN 500 MG PO TABS: 1000.0000 mg | ORAL_TABLET | Freq: Once | ORAL | Status: AC

## 2023-01-01 MED ORDER — PANTOPRAZOLE SODIUM 40 MG PO TBEC
40.0000 mg | DELAYED_RELEASE_TABLET | Freq: Every day | ORAL | Status: DC
  Administered 2023-01-01 – 2023-01-02 (×2): 40 mg via ORAL
  Filled 2023-01-01 (×2): qty 1

## 2023-01-01 MED ORDER — DEXAMETHASONE SODIUM PHOSPHATE 10 MG/ML IJ SOLN
INTRAMUSCULAR | Status: DC | PRN
  Administered 2023-01-01: 10 mg via INTRAVENOUS

## 2023-01-01 MED ORDER — FENTANYL CITRATE (PF) 100 MCG/2ML IJ SOLN
INTRAMUSCULAR | Status: AC
  Filled 2023-01-01: qty 2

## 2023-01-01 MED ORDER — ONDANSETRON HCL 4 MG/2ML IJ SOLN: 4.0000 mg | Freq: Four times a day (QID) | INTRAMUSCULAR | Status: DC | PRN

## 2023-01-01 MED ORDER — CEFAZOLIN SODIUM-DEXTROSE 2-4 GM/100ML-% IV SOLN
2.0000 g | INTRAVENOUS | Status: AC
  Administered 2023-01-01: 2 g via INTRAVENOUS

## 2023-01-01 MED ORDER — ALUM & MAG HYDROXIDE-SIMETH 200-200-20 MG/5ML PO SUSP: 30.0000 mL | ORAL | Status: DC | PRN

## 2023-01-01 MED ORDER — ONDANSETRON HCL 4 MG PO TABS: 4.0000 mg | ORAL_TABLET | Freq: Four times a day (QID) | ORAL | Status: DC | PRN

## 2023-01-01 MED ORDER — CEFAZOLIN SODIUM-DEXTROSE 2-4 GM/100ML-% IV SOLN
INTRAVENOUS | Status: AC
  Filled 2023-01-01: qty 100

## 2023-01-01 MED ORDER — 0.9 % SODIUM CHLORIDE (POUR BTL) OPTIME
TOPICAL | Status: DC | PRN
  Administered 2023-01-01: 1000 mL

## 2023-01-01 MED ORDER — FENTANYL CITRATE (PF) 100 MCG/2ML IJ SOLN: 25.0000 ug | INTRAMUSCULAR | Status: DC | PRN

## 2023-01-01 MED ORDER — FERROUS SULFATE 325 (65 FE) MG PO TABS
325.0000 mg | ORAL_TABLET | Freq: Three times a day (TID) | ORAL | Status: DC
  Administered 2023-01-01 – 2023-01-02 (×2): 325 mg via ORAL
  Filled 2023-01-01 (×2): qty 1

## 2023-01-01 MED ORDER — LIDOCAINE 2% (20 MG/ML) 5 ML SYRINGE
INTRAMUSCULAR | Status: DC | PRN
  Administered 2023-01-01: 80 mg via INTRAVENOUS

## 2023-01-01 MED ORDER — ONDANSETRON HCL 4 MG/2ML IJ SOLN
INTRAMUSCULAR | Status: DC | PRN
  Administered 2023-01-01: 4 mg via INTRAVENOUS

## 2023-01-01 MED ORDER — MAGNESIUM CITRATE PO SOLN: 1.0000 | Freq: Once | ORAL | Status: DC | PRN

## 2023-01-01 MED ORDER — PRONTOSAN WOUND IRRIGATION OPTIME
TOPICAL | Status: DC | PRN
  Administered 2023-01-01: 1 via TOPICAL

## 2023-01-01 MED ORDER — ACETAMINOPHEN 500 MG PO TABS
1000.0000 mg | ORAL_TABLET | Freq: Four times a day (QID) | ORAL | Status: DC
  Administered 2023-01-01 – 2023-01-02 (×3): 1000 mg via ORAL
  Filled 2023-01-01 (×3): qty 2

## 2023-01-01 MED ORDER — METHOCARBAMOL 1000 MG/10ML IJ SOLN: 500.0000 mg | Freq: Four times a day (QID) | INTRAVENOUS | Status: DC | PRN

## 2023-01-01 MED ORDER — DOCUSATE SODIUM 100 MG PO CAPS
100.0000 mg | ORAL_CAPSULE | Freq: Two times a day (BID) | ORAL | Status: DC
  Administered 2023-01-01 – 2023-01-02 (×2): 100 mg via ORAL
  Filled 2023-01-01 (×2): qty 1

## 2023-01-01 MED ORDER — MIDAZOLAM HCL 2 MG/2ML IJ SOLN
INTRAMUSCULAR | Status: DC | PRN
  Administered 2023-01-01: 2 mg via INTRAVENOUS

## 2023-01-01 MED ORDER — LACTATED RINGERS IV SOLN: INTRAVENOUS | Status: DC

## 2023-01-01 MED ORDER — ACETAMINOPHEN 325 MG PO TABS: 325.0000 mg | ORAL_TABLET | Freq: Four times a day (QID) | ORAL | Status: DC | PRN

## 2023-01-01 MED ORDER — SODIUM CHLORIDE 0.9 % IR SOLN
Status: DC | PRN
  Administered 2023-01-01: 1000 mL

## 2023-01-01 MED ORDER — CEFAZOLIN SODIUM-DEXTROSE 2-4 GM/100ML-% IV SOLN
2.0000 g | Freq: Four times a day (QID) | INTRAVENOUS | Status: AC
  Administered 2023-01-01 – 2023-01-02 (×2): 2 g via INTRAVENOUS
  Filled 2023-01-01 (×2): qty 100

## 2023-01-01 MED ORDER — ONDANSETRON HCL 4 MG/2ML IJ SOLN: 4.0000 mg | Freq: Once | INTRAMUSCULAR | Status: DC | PRN

## 2023-01-01 MED ORDER — TRANEXAMIC ACID-NACL 1000-0.7 MG/100ML-% IV SOLN
INTRAVENOUS | Status: AC
  Filled 2023-01-01: qty 100

## 2023-01-01 MED ORDER — FENTANYL CITRATE (PF) 100 MCG/2ML IJ SOLN
INTRAMUSCULAR | Status: DC | PRN
  Administered 2023-01-01: 50 ug via INTRAVENOUS

## 2023-01-01 MED ORDER — OLMESARTAN MEDOXOMIL-HCTZ 40-12.5 MG PO TABS: 1.0000 | ORAL_TABLET | Freq: Every day | ORAL | Status: DC

## 2023-01-01 MED ORDER — BUPIVACAINE-MELOXICAM ER 400-12 MG/14ML IJ SOLN
INTRAMUSCULAR | Status: AC
  Filled 2023-01-01: qty 1

## 2023-01-01 MED ORDER — BUPIVACAINE IN DEXTROSE 0.75-8.25 % IT SOLN
INTRATHECAL | Status: DC | PRN
Start: 2023-01-01 — End: 2023-01-01
  Administered 2023-01-01: 2 mL via INTRATHECAL

## 2023-01-01 MED ORDER — METHOCARBAMOL 500 MG PO TABS
500.0000 mg | ORAL_TABLET | Freq: Four times a day (QID) | ORAL | Status: DC | PRN
  Administered 2023-01-01 – 2023-01-02 (×3): 500 mg via ORAL
  Filled 2023-01-01 (×3): qty 1

## 2023-01-01 MED ORDER — IRBESARTAN 150 MG PO TABS
300.0000 mg | ORAL_TABLET | Freq: Every day | ORAL | Status: DC
  Administered 2023-01-02: 300 mg via ORAL
  Filled 2023-01-01: qty 2

## 2023-01-01 MED ORDER — AMISULPRIDE (ANTIEMETIC) 5 MG/2ML IV SOLN: 10.0000 mg | Freq: Once | INTRAVENOUS | Status: DC | PRN

## 2023-01-01 MED ORDER — SODIUM CHLORIDE 0.9 % IV SOLN: INTRAVENOUS | Status: DC

## 2023-01-01 MED ORDER — SORBITOL 70 % SOLN: 30.0000 mL | Freq: Every day | Status: DC | PRN

## 2023-01-01 MED ORDER — OXYCODONE HCL 5 MG PO TABS
5.0000 mg | ORAL_TABLET | ORAL | Status: DC | PRN
  Administered 2023-01-01: 10 mg via ORAL
  Filled 2023-01-01: qty 2

## 2023-01-01 MED ORDER — PROPOFOL 10 MG/ML IV BOLUS
INTRAVENOUS | Status: DC | PRN
Start: 2023-01-01 — End: 2023-01-01
  Administered 2023-01-01: 40 mg via INTRAVENOUS
  Administered 2023-01-01: 30 mg via INTRAVENOUS

## 2023-01-01 MED ORDER — PROPOFOL 10 MG/ML IV BOLUS
INTRAVENOUS | Status: AC
  Filled 2023-01-01: qty 20

## 2023-01-01 MED ORDER — TRANEXAMIC ACID-NACL 1000-0.7 MG/100ML-% IV SOLN
1000.0000 mg | INTRAVENOUS | Status: AC
  Administered 2023-01-01: 1000 mg via INTRAVENOUS

## 2023-01-01 MED ORDER — VANCOMYCIN HCL 1 G IV SOLR
INTRAVENOUS | Status: DC | PRN
  Administered 2023-01-01: 1000 mg

## 2023-01-01 MED ORDER — PROPOFOL 500 MG/50ML IV EMUL
INTRAVENOUS | Status: DC | PRN
  Administered 2023-01-01: 80 ug/kg/min via INTRAVENOUS

## 2023-01-01 MED ORDER — ACETAMINOPHEN 500 MG PO TABS
ORAL_TABLET | ORAL | Status: AC
  Administered 2023-01-01: 1000 mg via ORAL
  Filled 2023-01-01: qty 2

## 2023-01-01 MED ORDER — OXYCODONE HCL 5 MG PO TABS
10.0000 mg | ORAL_TABLET | ORAL | Status: DC | PRN
  Administered 2023-01-02 (×2): 10 mg via ORAL
  Filled 2023-01-01 (×2): qty 2

## 2023-01-01 MED ORDER — TRANEXAMIC ACID 1000 MG/10ML IV SOLN
INTRAVENOUS | Status: DC | PRN
  Administered 2023-01-01: 2000 mg via TOPICAL

## 2023-01-01 MED ORDER — DIPHENHYDRAMINE HCL 12.5 MG/5ML PO ELIX: 25.0000 mg | ORAL_SOLUTION | ORAL | Status: DC | PRN

## 2023-01-01 MED ORDER — POLYETHYLENE GLYCOL 3350 17 G PO PACK
17.0000 g | PACK | Freq: Every day | ORAL | Status: DC
  Administered 2023-01-02: 17 g via ORAL
  Filled 2023-01-01: qty 1

## 2023-01-01 MED ORDER — TRANEXAMIC ACID-NACL 1000-0.7 MG/100ML-% IV SOLN
1000.0000 mg | Freq: Once | INTRAVENOUS | Status: AC
  Administered 2023-01-01: 1000 mg via INTRAVENOUS
  Filled 2023-01-01: qty 100

## 2023-01-01 MED ORDER — ORAL CARE MOUTH RINSE: 15.0000 mL | Freq: Once | OROMUCOSAL | Status: AC

## 2023-01-01 MED ORDER — OXYCODONE HCL ER 10 MG PO T12A
10.0000 mg | EXTENDED_RELEASE_TABLET | Freq: Two times a day (BID) | ORAL | Status: DC
  Administered 2023-01-01 – 2023-01-02 (×2): 10 mg via ORAL
  Filled 2023-01-01 (×2): qty 1

## 2023-01-01 MED ORDER — BUPIVACAINE-MELOXICAM ER 400-12 MG/14ML IJ SOLN
INTRAMUSCULAR | Status: DC | PRN
  Administered 2023-01-01: 400 mg

## 2023-01-01 MED ORDER — DEXAMETHASONE SODIUM PHOSPHATE 10 MG/ML IJ SOLN
10.0000 mg | Freq: Once | INTRAMUSCULAR | Status: AC
  Administered 2023-01-02: 10 mg via INTRAVENOUS
  Filled 2023-01-01: qty 1

## 2023-01-01 MED ORDER — METOCLOPRAMIDE HCL 5 MG/ML IJ SOLN
5.0000 mg | Freq: Three times a day (TID) | INTRAMUSCULAR | Status: DC | PRN
  Administered 2023-01-01: 10 mg via INTRAVENOUS
  Filled 2023-01-01: qty 2

## 2023-01-01 MED ORDER — VANCOMYCIN HCL 1000 MG IV SOLR
INTRAVENOUS | Status: AC
  Filled 2023-01-01: qty 20

## 2023-01-01 MED ORDER — HYDROMORPHONE HCL 1 MG/ML IJ SOLN: 0.5000 mg | INTRAMUSCULAR | Status: DC | PRN

## 2023-01-01 MED ORDER — PHENOL 1.4 % MT LIQD: 1.0000 | OROMUCOSAL | Status: DC | PRN

## 2023-01-01 MED ORDER — CHLORHEXIDINE GLUCONATE 0.12 % MT SOLN: 15.0000 mL | Freq: Once | OROMUCOSAL | Status: AC

## 2023-01-01 MED ORDER — METOCLOPRAMIDE HCL 5 MG PO TABS: 5.0000 mg | ORAL_TABLET | Freq: Three times a day (TID) | ORAL | Status: DC | PRN

## 2023-01-01 MED ORDER — MENTHOL 3 MG MT LOZG: 1.0000 | LOZENGE | OROMUCOSAL | Status: DC | PRN

## 2023-01-01 MED ORDER — ASPIRIN 81 MG PO CHEW
81.0000 mg | CHEWABLE_TABLET | Freq: Two times a day (BID) | ORAL | Status: DC
  Administered 2023-01-01 – 2023-01-02 (×2): 81 mg via ORAL
  Filled 2023-01-01 (×2): qty 1

## 2023-01-01 MED ORDER — MIDAZOLAM HCL 2 MG/2ML IJ SOLN
INTRAMUSCULAR | Status: AC
  Filled 2023-01-01: qty 2

## 2023-01-01 MED ORDER — HYDROCHLOROTHIAZIDE 12.5 MG PO TABS
12.5000 mg | ORAL_TABLET | Freq: Every day | ORAL | Status: DC
  Administered 2023-01-02: 12.5 mg via ORAL
  Filled 2023-01-01: qty 1

## 2023-01-01 MED ORDER — GLYCOPYRROLATE 0.2 MG/ML IJ SOLN
INTRAMUSCULAR | Status: DC | PRN
Start: 2023-01-01 — End: 2023-01-01
  Administered 2023-01-01: .2 mg via INTRAVENOUS

## 2023-01-01 SURGICAL SUPPLY — 66 items
BAG COUNTER SPONGE SURGICOUNT (BAG) ×1 IMPLANT
BAG DECANTER FOR FLEXI CONT (MISCELLANEOUS) ×1 IMPLANT
BAG SPNG CNTER NS LX DISP (BAG) ×1
BLADE SAG 18X100X1.27 (BLADE) ×1 IMPLANT
COVER PERINEAL POST (MISCELLANEOUS) ×1 IMPLANT
COVER SURGICAL LIGHT HANDLE (MISCELLANEOUS) ×1 IMPLANT
CUP SECTOR GRIPTON 58MM (Orthopedic Implant) IMPLANT
DRAPE C-ARM 42X72 X-RAY (DRAPES) ×1 IMPLANT
DRAPE POUCH INSTRU U-SHP 10X18 (DRAPES) ×1 IMPLANT
DRAPE STERI IOBAN 125X83 (DRAPES) ×1 IMPLANT
DRAPE U-SHAPE 47X51 STRL (DRAPES) ×2 IMPLANT
DRSG AQUACEL AG ADV 3.5X10 (GAUZE/BANDAGES/DRESSINGS) ×1 IMPLANT
DRSG TEGADERM 4X4.75 (GAUZE/BANDAGES/DRESSINGS) IMPLANT
DURAPREP 26ML APPLICATOR (WOUND CARE) ×2 IMPLANT
ELECT BLADE 4.0 EZ CLEAN MEGAD (MISCELLANEOUS) ×1
ELECT REM PT RETURN 9FT ADLT (ELECTROSURGICAL) ×1
ELECTRODE BLDE 4.0 EZ CLN MEGD (MISCELLANEOUS) ×1 IMPLANT
ELECTRODE REM PT RTRN 9FT ADLT (ELECTROSURGICAL) ×1 IMPLANT
GAUZE SPONGE 4X4 12PLY STRL (GAUZE/BANDAGES/DRESSINGS) IMPLANT
GAUZE XEROFORM 5X9 LF (GAUZE/BANDAGES/DRESSINGS) IMPLANT
GLOVE BIOGEL PI IND STRL 7.0 (GLOVE) ×2 IMPLANT
GLOVE BIOGEL PI IND STRL 7.5 (GLOVE) ×5 IMPLANT
GLOVE ECLIPSE 7.0 STRL STRAW (GLOVE) ×2 IMPLANT
GLOVE SKINSENSE STRL SZ7.5 (GLOVE) ×1 IMPLANT
GLOVE SURG SYN 7.5 E (GLOVE) ×2
GLOVE SURG SYN 7.5 PF PI (GLOVE) ×2 IMPLANT
GLOVE SURG UNDER POLY LF SZ7 (GLOVE) ×3 IMPLANT
GLOVE SURG UNDER POLY LF SZ7.5 (GLOVE) ×2 IMPLANT
GOWN STRL REUS W/ TWL LRG LVL3 (GOWN DISPOSABLE) IMPLANT
GOWN STRL REUS W/ TWL XL LVL3 (GOWN DISPOSABLE) ×1 IMPLANT
GOWN STRL REUS W/TWL LRG LVL3 (GOWN DISPOSABLE)
GOWN STRL REUS W/TWL XL LVL3 (GOWN DISPOSABLE) ×1
GOWN STRL SURGICAL XL XLNG (GOWN DISPOSABLE) ×1 IMPLANT
GOWN TOGA ZIPPER T7+ PEEL AWAY (MISCELLANEOUS) ×2 IMPLANT
HANDPIECE INTERPULSE COAX TIP (DISPOSABLE) ×1
HEAD DELTA 28MM 12/14 P8.5 HIP (Head) IMPLANT
HOOD PEEL AWAY T7 (MISCELLANEOUS) ×1 IMPLANT
IV NS IRRIG 3000ML ARTHROMATIC (IV SOLUTION) ×1 IMPLANT
KIT BASIN OR (CUSTOM PROCEDURE TRAY) ×1 IMPLANT
LINER BI-MENTUM ALTRX 49X28 (Liner) IMPLANT
LINER DM PINN HIP 58/49 (Liner) IMPLANT
MARKER SKIN DUAL TIP RULER LAB (MISCELLANEOUS) ×1 IMPLANT
NDL SPNL 18GX3.5 QUINCKE PK (NEEDLE) ×1 IMPLANT
NEEDLE SPNL 18GX3.5 QUINCKE PK (NEEDLE) ×1
PACK TOTAL JOINT (CUSTOM PROCEDURE TRAY) ×1 IMPLANT
PACK UNIVERSAL I (CUSTOM PROCEDURE TRAY) ×1 IMPLANT
SET HNDPC FAN SPRY TIP SCT (DISPOSABLE) ×1 IMPLANT
SOLUTION PRONTOSAN WOUND 350ML (IRRIGATION / IRRIGATOR) ×1 IMPLANT
STAPLER VISISTAT 35W (STAPLE) IMPLANT
STEM FEM ACTIS HIGH SZ8 (Stem) IMPLANT
SUT ETHIBOND 2 V 37 (SUTURE) ×1 IMPLANT
SUT ETHILON 2 0 FS 18 (SUTURE) IMPLANT
SUT VIC AB 0 CT1 27 (SUTURE) ×1
SUT VIC AB 0 CT1 27XBRD ANBCTR (SUTURE) ×1 IMPLANT
SUT VIC AB 1 CTX 36 (SUTURE) ×1
SUT VIC AB 1 CTX36XBRD ANBCTR (SUTURE) ×1 IMPLANT
SUT VIC AB 2-0 CT1 (SUTURE) IMPLANT
SUT VIC AB 2-0 CT1 27 (SUTURE) ×2
SUT VIC AB 2-0 CT1 TAPERPNT 27 (SUTURE) ×2 IMPLANT
SYR 50ML LL SCALE MARK (SYRINGE) ×1 IMPLANT
TOWEL GREEN STERILE (TOWEL DISPOSABLE) ×1 IMPLANT
TRAY CATH INTERMITTENT SS 16FR (CATHETERS) IMPLANT
TRAY FOLEY W/BAG SLVR 16FR (SET/KITS/TRAYS/PACK)
TRAY FOLEY W/BAG SLVR 16FR ST (SET/KITS/TRAYS/PACK) IMPLANT
TUBE SUCT ARGYLE STRL (TUBING) ×1 IMPLANT
YANKAUER SUCT BULB TIP NO VENT (SUCTIONS) ×1 IMPLANT

## 2023-01-01 NOTE — Op Note (Signed)
RIGHT TOTAL HIP ARTHROPLASTY ANTERIOR APPROACH  Procedure Note Kevin Patterson   [id number]  Pre-op Diagnosis: right hip osteoarthritis     Post-op Diagnosis: same  Operative Findings Complete loss of articular cartilage   Operative Procedures  1. Total hip replacement; Right hip; uncemented [id number]   Surgeon: Gershon Mussel, M.D.  Assist: Oneal Grout, PA-C   Anesthesia: spinal  Prosthesis: Depuy Acetabulum: Pinnacle 58 mm Femur: Actis 8 HO Head: 58/49 dual mobility size: +8.5 Liner: +4 Bearing Type: dual mobility  Total Hip Arthroplasty (Anterior Approach) Op Note:  After informed consent was obtained and the operative extremity marked in the holding area, the patient was brought back to the operating room and placed supine on the HANA table. Next, the operative extremity was prepped and draped in normal sterile fashion. Surgical timeout occurred verifying patient identification, surgical site, surgical procedure and administration of antibiotics.  A 10 cm longitudinal incision was made starting from 2 fingerbreadths lateral and inferior to the ASIS towards the lateral aspect of the patella.  A Hueter approach to the hip was performed, using the interval between tensor fascia lata and sartorius.  Dissection was carried bluntly down onto the anterior hip capsule. The lateral femoral circumflex vessels were identified and coagulated. A capsulotomy was performed and the capsular flaps tagged for later repair.  The neck osteotomy was performed. The femoral head was removed which showed severe wear, the acetabular rim was cleared of soft tissue and osteophytes and attention was turned to reaming the acetabulum.  Sequential reaming was performed under fluoroscopic guidance down to the floor of the cotyloid fossa. We reamed to a size 58 mm, and then impacted the acetabular shell.  The liner was then placed after irrigation and attention turned to the femur.  After placing the  femoral hook, the leg was taken to externally rotated, extended and adducted position taking care to perform soft tissue releases to allow for adequate mobilization of the femur. Soft tissue was cleared from the shoulder of the greater trochanter and the hook elevator used to improve exposure of the proximal femur. Sequential broaching performed up to a size 8. Trial neck and head were placed. The leg was brought back up to neutral and the construct reduced.  Antibiotic irrigation was placed in the surgical wound.  The position and sizing of components, offset and leg lengths were checked using fluoroscopy. Stability of the construct was checked in extension and external rotation without any subluxation, shuck or impingement of prosthesis. We dislocated the prosthesis, dropped the leg back into position, removed trial components, and irrigated copiously. The final stem and head was then placed, the leg brought back up, the system reduced and fluoroscopy used to verify positioning.  We irrigated, obtained hemostasis and closed the capsule using #2 ethibond suture.  One gram of vancomycin powder was placed in the surgical bed.   One gram of topical tranexamic acid was injected into the joint.  The fascia was closed with #1 vicryl plus, the deep fat layer was closed with 0 vicryl, the subcutaneous layers closed with 2.0 Vicryl Plus and the skin closed with 2.0 nylon and dermabond. A sterile dressing was applied. The patient was awakened in the operating room and taken to recovery in stable condition.  All sponge, needle, and instrument counts were correct at the end of the case.   Tessa Lerner, my PA, was a medical necessity for opening, closing, limb positioning, retracting, exposing, and overall facilitation and timely completion of the  surgery.  Position: supine  Complications: see description of procedure.  Time Out: performed   Drains/Packing: none  Estimated blood loss: see anesthesia  record  Returned to Recovery Room: in good condition.   Antibiotics: yes   Mechanical VTE (DVT) Prophylaxis: sequential compression devices, TED thigh-high  Chemical VTE (DVT) Prophylaxis: aspirin   Fluid Replacement: see anesthesia record  Specimens Removed: 1 to pathology   Sponge and Instrument Count Correct? yes   PACU: portable radiograph - low AP   Plan/RTC: Return in 2 weeks for staple removal. Weight Bearing/Load Lower Extremity: full  Hip precautions: none Suture Removal: 2 weeks   N. Glee Arvin, MD [name] 1:15 PM   Implant Name Type Inv. Item Serial No. Manufacturer Lot No. LRB No. Used Action  CUP SECTOR GRIPTON - [id number] Orthopedic Implant CUP SECTOR GRIPTON  [employer] [id number] Right 1 Implanted  LINER DM PINN HIP 58/49 - [id number] Liner LINER DM PINN HIP 58/49  [employer]PU[employer]TH[employer] [id number] Right 1 Implanted  HEAD DELTA 12/14 P8.5 HIP - [id number] Head HEAD DELTA 12/14 P8.5 HIP  [employer]TH[employer] [id number] Right 1 Implanted  LINER BI-MENTUM ALTRX 49X28 - [id number] Liner LINER BI-MENTUM ALTRX 49X28  [employer] [id number] Right 1 Implanted  STEM FEM ACTIS HIGH SZ8 - [id number] Stem STEM FEM ACTIS HIGH SZ8  [employer] [id number] Right 1 Implanted

## 2023-01-01 NOTE — Transfer of Care (Signed)
Immediate Anesthesia Transfer of Care Note  Patient: Kevin Patterson  Procedure(s) Performed: RIGHT TOTAL HIP ARTHROPLASTY ANTERIOR APPROACH (Right: Hip)  Patient Location: PACU  Anesthesia Type:MAC and Spinal  Level of Consciousness: awake, oriented, and drowsy  Airway & Oxygen Therapy: Patient Spontanous Breathing  Post-op Assessment: Report given to RN and Post -op Vital signs reviewed and stable  Post vital signs: Reviewed and stable  Last Vitals:  Vitals Value Taken Time  BP 104/73 01/01/23 1350  Temp    Pulse 62 01/01/23 1354  Resp 11 01/01/23 1354  SpO2 97 % 01/01/23 1354  Vitals shown include unfiled device data.  Last Pain:  Vitals:   01/01/23 1048  TempSrc:   PainSc: 4          Complications: No notable events documented.

## 2023-01-01 NOTE — Progress Notes (Signed)
Dr. Bradley Ferris made aware of today's BMP result- potassium 5.3. Ok per Dr. Bradley Ferris.

## 2023-01-01 NOTE — Anesthesia Procedure Notes (Signed)
Spinal  Patient location during procedure: OR Start time: 01/01/2023 11:40 AM End time: 01/01/2023 11:45 AM Reason for block: surgical anesthesia Staffing Performed: anesthesiologist  Anesthesiologist: Leonides Grills, MD Performed by: Leonides Grills, MD Authorized by: Leonides Grills, MD   Preanesthetic Checklist Completed: patient identified, IV checked, risks and benefits discussed, surgical consent, monitors and equipment checked, pre-op evaluation and timeout performed Spinal Block Patient position: sitting Prep: DuraPrep Patient monitoring: cardiac monitor, continuous pulse ox and blood pressure Approach: midline Location: L2-3 Injection technique: single-shot Needle Needle type: Pencan  Needle gauge: 24 G Needle length: 9 cm Assessment Sensory level: T10 Events: CSF return Additional Notes Functioning IV was confirmed and monitors were applied. Sterile prep and drape, including hand hygiene and sterile gloves were used. The patient was positioned and the spine was prepped. The skin was anesthetized with lidocaine.  Free flow of clear CSF was obtained prior to injecting local anesthetic into the CSF.  The spinal needle aspirated freely following injection.  The needle was carefully withdrawn.  The patient tolerated the procedure well.

## 2023-01-01 NOTE — H&P (Signed)
PREOPERATIVE H&P  Chief Complaint: right hip osteoarthritis  HPI: Kevin Patterson is a 67 y.o. male who presents for surgical treatment of right hip osteoarthritis.  He denies any changes in medical history.  Past Surgical History:  Procedure Laterality Date   APPENDECTOMY     BACK SURGERY     COLONOSCOPY     x several   JOINT REPLACEMENT Left    left knee   KNEE ARTHROTOMY Bilateral 12/28/1968   open cleanout   TONSILLECTOMY     TONSILLECTOMY AND ADENOIDECTOMY     TOTAL KNEE ARTHROPLASTY Left 03/13/2015   Procedure: LEFT TOTAL KNEE ARTHROPLASTY;  Surgeon: Salvatore Marvel, MD;  Location: [facility] OR;  Service: Orthopedics;  Laterality: Left;   TOTAL KNEE ARTHROPLASTY Right 04/10/2021   Procedure: RIGHT TOTAL KNEE ARTHROPLASTY;  Surgeon: Cammy Copa, MD;  Location: [facility] OR;  Service: Orthopedics;  Laterality: Right;   WISDOM TOOTH EXTRACTION     Social History   Socioeconomic History   Marital status: Married    Spouse name: Not on file   Number of children: Not on file   Years of education: Not on file   Highest education level: Not on file  Occupational History   Not on file  Tobacco Use   Smoking status: Former    Current packs/day: 0.00    Types: Cigarettes    Quit date: 04/29/1973    Years since quitting: 49.7   Smokeless tobacco: Current  Vaping Use   Vaping status: Every Day   Substances: THC, CBD  Substance and Sexual Activity   Alcohol use: Not Currently    Alcohol/week: 3.0 standard drinks of alcohol    Types: 3 Standard drinks or equivalent per week   Drug use: Yes    Comment: CBD oil and gummies   Sexual activity: Yes  Other Topics Concern   Not on file  Social History Narrative   Not on file   Social Determinants of Health   Financial Resource Strain: Not on file  Food Insecurity: Low Risk  (10/21/2022)   [facility]   Hunger Vital Sign    Worried About Running Out of Food in the Last Year: Never true     Ran Out of Food in the Last Year: Never true  Transportation Needs: Not on file (10/21/2022)  Physical Activity: Not on file  Stress: Not on file  Social Connections: Unknown (07/05/2022)   [facility]   Social Network    Social Network: Not on file   History reviewed. No pertinent family history. Allergies  Allergen Reactions   Other     Surgical bandage caused skin burning    Prior to Admission medications   Medication Sig Start Date End Date Taking? Authorizing Provider  Ascorbic Acid (VITAMIN C) 1000 MG tablet Take 1,000 mg by mouth daily.   Yes [provider]  Ashwagandha 500 MG CAPS Take 500 mg by mouth daily.   Yes [provider]  Aspirin-Caffeine (BC FAST PAIN RELIEF PO) Take 1 packet by mouth daily as needed (pain).   Yes [provider]  Calcium Citrate-Vitamin D (CALCIUM + D PO) Take 1 tablet by mouth daily.   Yes [provider]  cholecalciferol (VITAMIN D3) 25 MCG (1000 UNIT) tablet Take 1,000 Units by mouth daily.   Yes [provider]  docusate sodium (COLACE) 100 MG capsule Take 1 capsule (100 mg total) by mouth daily as needed. 12/24/22  12/24/23  Cristie Hem, PA-C  Flaxseed, Linseed, (FLAXSEED OIL) 1000 MG CAPS Take 2,000 mg by mouth daily.   Yes [provider]  methocarbamol (ROBAXIN-750) 750 MG tablet Take 1 tablet (750 mg total) by mouth 2 (two) times daily as needed for muscle spasms. 12/24/22   Cristie Hem, PA-C  milk thistle 175 MG tablet Take 175 mg by mouth daily.   Yes [provider]  olmesartan-hydrochlorothiazide (BENICAR HCT) 40-12.5 MG tablet Take 1 tablet by mouth daily.   Yes [provider]  olopatadine (PATANOL) 0.1 % ophthalmic solution Place 1 drop into both eyes 2 (two) times daily as needed for allergies. 10/03/22 10/03/23 Yes [provider]  ondansetron (ZOFRAN-ODT) 4 MG disintegrating tablet Take 1 tablet (4 mg total) by mouth every 8  (eight) hours as needed for nausea or vomiting. 12/24/22   Cristie Hem, PA-C  pravastatin (PRAVACHOL) 40 MG tablet Take 40 mg by mouth daily.   Yes [provider]  sodium chloride (OCEAN) 0.65 % SOLN nasal spray Place 1 spray into both nostrils as needed for congestion.   Yes [provider]  tadalafil (CIALIS) 5 MG tablet Take 5 mg by mouth daily as needed for erectile dysfunction.   Yes [provider]  Vitamin A 2400 MCG (8000 UT) CAPS Take 8,000 Units by mouth daily.   Yes [provider]  vitamin B-12 (CYANOCOBALAMIN) 500 MCG tablet Take 500 mcg by mouth daily.   Yes [provider]  zinc gluconate 50 MG tablet Take 50 mg by mouth daily.   Yes [provider]  amoxicillin-clavulanate (AUGMENTIN) 875-125 MG tablet Take 1 tablet by mouth every 12 (twelve) hours. Patient not taking: Reported on 12/23/2022 12/07/22   Radford Pax, NP  aspirin EC 81 MG tablet Take 1 tablet (81 mg total) by mouth 2 (two) times daily. To be taken after surgery to prevent blood clots 12/02/22 12/02/23  Cristie Hem, PA-C  aspirin EC 81 MG tablet Take 1 tablet (81 mg total) by mouth 2 (two) times daily. To be taken after surgery to prevent blood clots 12/24/22   Cristie Hem, PA-C  docusate sodium (COLACE) 100 MG capsule Take 1 capsule (100 mg total) by mouth daily as needed. 12/02/22 12/02/23  Cristie Hem, PA-C  methocarbamol (ROBAXIN-750) 750 MG tablet Take 1 tablet (750 mg total) by mouth 2 (two) times daily as needed for muscle spasms. 12/02/22   Cristie Hem, PA-C  ondansetron (ZOFRAN) 4 MG tablet Take 1 tablet (4 mg total) by mouth every 8 (eight) hours as needed for nausea or vomiting. 12/02/22   Cristie Hem, PA-C  oxyCODONE-acetaminophen (PERCOCET) 5-325 MG tablet Take 1-2 tablets by mouth every 6 (six) hours as needed. To be taken after surgery 12/24/22   Cristie Hem, PA-C     Positive ROS: All other systems have been reviewed and were  otherwise negative with the exception of those mentioned in the HPI and as above.  Physical Exam: General: Alert, no acute distress Cardiovascular: No pedal edema Respiratory: No cyanosis, no use of accessory musculature GI: abdomen soft Skin: No lesions in the area of chief complaint Neurologic: Sensation intact distally Psychiatric: Patient is competent for consent with normal mood and affect Lymphatic: no lymphedema  MUSCULOSKELETAL: exam stable  Assessment: right hip osteoarthritis  Plan: Plan for Procedure(s): RIGHT TOTAL HIP ARTHROPLASTY ANTERIOR APPROACH  The risks benefits and alternatives were discussed with the patient including but not  limited to the risks of nonoperative treatment, versus surgical intervention including infection, bleeding, nerve injury,  blood clots, cardiopulmonary complications, morbidity, mortality, among others, and they were willing to proceed.   Glee Arvin, MD 01/01/2023 10:03 AM

## 2023-01-01 NOTE — Discharge Instructions (Signed)

## 2023-01-02 DIAGNOSIS — M1611 Unilateral primary osteoarthritis, right hip: Secondary | ICD-10-CM | POA: Diagnosis not present

## 2023-01-02 MED ORDER — CHLORHEXIDINE GLUCONATE CLOTH 2 % EX PADS
6.0000 | MEDICATED_PAD | Freq: Every day | CUTANEOUS | Status: DC
  Administered 2023-01-02: 6 via TOPICAL

## 2023-01-02 NOTE — Progress Notes (Signed)
Patient alert and oriented, mae's well, voiding adequate amount of urine, swallowing without difficulty, no c/o pain at time of discharge. Patient discharged home with family. Script and discharged instructions given to patient. Patient and spouse stated understanding of instructions given. Patient has an appointment with Dr. Roda Shutters

## 2023-01-02 NOTE — Anesthesia Postprocedure Evaluation (Signed)
Anesthesia Post Note  Patient: Kevin Patterson  Procedure(s) Performed: RIGHT TOTAL HIP ARTHROPLASTY ANTERIOR APPROACH (Right: Hip)     Patient location during evaluation: PACU Anesthesia Type: Spinal Level of consciousness: awake Pain management: pain level controlled Vital Signs Assessment: post-procedure vital signs reviewed and stable Respiratory status: spontaneous breathing, nonlabored ventilation and respiratory function stable Cardiovascular status: blood pressure returned to baseline and stable Postop Assessment: no apparent nausea or vomiting Anesthetic complications: no   No notable events documented.  Last Vitals:  Vitals:   01/02/23 0333 01/02/23 0807  BP: 118/70 119/64  Pulse: 84 74  Resp: 17 17  Temp: 37.2 C 37 C  SpO2: 97% 97%    Last Pain:  Vitals:   01/02/23 0807  TempSrc: Oral  PainSc:                  Catheryn Bacon Hernandez [place]

## 2023-01-02 NOTE — Plan of Care (Signed)
  Problem: Education: Goal: Knowledge of the prescribed therapeutic regimen will improve Outcome: Completed/Met Goal: Understanding of discharge needs will improve Outcome: Completed/Met Goal: Individualized Educational Video(s) Outcome: Completed/Met   Problem: Activity: Goal: Ability to avoid complications of mobility impairment will improve Outcome: Completed/Met Goal: Ability to tolerate increased activity will improve Outcome: Completed/Met   Problem: Clinical Measurements: Goal: Postoperative complications will be avoided or minimized Outcome: Completed/Met   Problem: Pain Management: Goal: Pain level will decrease with appropriate interventions Outcome: Completed/Met   Problem: Skin Integrity: Goal: Will show signs of wound healing Outcome: Completed/Met   

## 2023-01-02 NOTE — Discharge Summary (Signed)
Patient ID: Kevin Patterson MRN: [id number] DOB/AGE: [birth year] 67 y.o.  Admit date: 01/01/2023 Discharge date: 01/02/2023  Admission Diagnoses:  Primary osteoarthritis of right hip  Discharge Diagnoses:  Principal Problem:   Primary osteoarthritis of right hip Active Problems:   Status post total replacement of right hip   Past Medical History:  Diagnosis Date   Arthritis    CLL (chronic lymphocytic leukemia) (HCC)    Cyst of kidney, acquired    history of , monitored   High cholesterol    History of kidney stones    x 2 - passed stones, no surgery required   Hypertension     Surgeries: Procedure(s): RIGHT TOTAL HIP ARTHROPLASTY ANTERIOR APPROACH on 01/01/2023   Consultants (if any):   Discharged Condition: Improved  Hospital Course: [place] Kevin Patterson is an 67 y.o. male who was admitted 01/01/2023 with a diagnosis of Primary osteoarthritis of right hip and went to the operating room on 01/01/2023 and underwent the above named procedures.    He was given perioperative antibiotics:  Anti-infectives (From admission, onward)    Start     Dose/Rate Route Frequency Ordered Stop   01/01/23 1830  ceFAZolin (ANCEF) IVPB 2g/100 mL premix        2 g 200 mL/hr over 30 Minutes Intravenous Every 6 hours 01/01/23 1733 01/02/23 0114   01/01/23 1222  vancomycin (VANCOCIN) powder  Status:  Discontinued          As needed 01/01/23 1222 01/01/23 1357   01/01/23 1015  ceFAZolin (ANCEF) IVPB 2g/100 mL premix        2 g 200 mL/hr over 30 Minutes Intravenous On call to O.R. 01/01/23 1010 01/01/23 1200   01/01/23 1015  ceFAZolin (ANCEF) 2-4 GM/100ML-% IVPB       Note to Pharmacy: Darrick Huntsman: cabinet override      01/01/23 1015 01/01/23 1202     .  He was given sequential compression devices, early ambulation, and appropriate chemoprophylaxis for DVT prophylaxis.  He benefited maximally from the hospital stay and there were no complications.    Recent vital signs:   Vitals:   01/02/23 0333 01/02/23 0807  BP: 118/70 119/64  Pulse: 84 74  Resp: 17 17  Temp: 98.9 F (37.2 C) 98.6 F (37 C)  SpO2: 97% 97%    Recent laboratory studies:  Lab Results  Component Value Date   HGB 12.1 (L) 01/01/2023   HGB 13.1 11/29/2022   HGB 13.0 05/27/2022   Lab Results  Component Value Date   WBC 41.5 (H) 01/01/2023   PLT 176 01/01/2023   Lab Results  Component Value Date   INR 1.02 03/02/2015   Lab Results  Component Value Date   NA 140 01/01/2023   K 5.3 (H) 01/01/2023   CL 106 01/01/2023   CO2 24 01/01/2023   BUN 9 01/01/2023   CREATININE 0.99 01/01/2023   GLUCOSE 133 (H) 01/01/2023    Discharge Medications:   Allergies as of 01/02/2023       Reactions   Other    Surgical bandage caused skin burning         Medication List     STOP taking these medications    amoxicillin-clavulanate 875-125 MG tablet Commonly known as: AUGMENTIN   Flaxseed Oil 1000 MG Caps   ondansetron 4 MG disintegrating tablet Commonly known as: ZOFRAN-ODT       TAKE these medications    Ashwagandha 500 MG Caps Take 500 mg by mouth  daily.   aspirin EC 81 MG tablet Take 1 tablet (81 mg total) by mouth 2 (two) times daily. To be taken after surgery to prevent blood clots What changed: Another medication with the same name was removed. Continue taking this medication, and follow the directions you see here.   BC FAST PAIN RELIEF PO Take 1 packet by mouth daily as needed (pain).   CALCIUM + D PO Take 1 tablet by mouth daily.   cholecalciferol 25 MCG (1000 UNIT) tablet Commonly known as: VITAMIN D3 Take 1,000 Units by mouth daily.   docusate sodium 100 MG capsule Commonly known as: Colace Take 1 capsule (100 mg total) by mouth daily as needed. What changed: Another medication with the same name was removed. Continue taking this medication, and follow the directions you see here.   methocarbamol 750 MG tablet Commonly known as: Robaxin-750 Take  1 tablet (750 mg total) by mouth 2 (two) times daily as needed for muscle spasms. What changed: Another medication with the same name was removed. Continue taking this medication, and follow the directions you see here.   milk thistle 175 MG tablet Take 175 mg by mouth daily.   olmesartan-hydrochlorothiazide 40-12.5 MG tablet Commonly known as: BENICAR HCT Take 1 tablet by mouth daily.   olopatadine 0.1 % ophthalmic solution Commonly known as: PATANOL Place 1 drop into both eyes 2 (two) times daily as needed for allergies.   ondansetron 4 MG tablet Commonly known as: Zofran Take 1 tablet (4 mg total) by mouth every 8 (eight) hours as needed for nausea or vomiting.   oxyCODONE-acetaminophen 5-325 MG tablet Commonly known as: Percocet Take 1-2 tablets by mouth every 6 (six) hours as needed. To be taken after surgery   pravastatin 40 MG tablet Commonly known as: PRAVACHOL Take 40 mg by mouth daily.   sodium chloride 0.65 % Soln nasal spray Commonly known as: OCEAN Place 1 spray into both nostrils as needed for congestion.   tadalafil 5 MG tablet Commonly known as: CIALIS Take 5 mg by mouth daily as needed for erectile dysfunction.   Vitamin A 2400 MCG (8000 UT) Caps Take 8,000 Units by mouth daily.   vitamin B-12 500 MCG tablet Commonly known as: CYANOCOBALAMIN Take 500 mcg by mouth daily.   vitamin C 1000 MG tablet Take 1,000 mg by mouth daily.   zinc gluconate 50 MG tablet Take 50 mg by mouth daily.               Durable Medical Equipment  (From admission, onward)           Start     Ordered   01/01/23 1541  DME Walker rolling  Once       Question:  Patient needs a walker to treat with the following condition  Answer:  History of hip replacement   01/01/23 1540   01/01/23 1541  DME 3 n 1  Once        01/01/23 1540   01/01/23 1541  DME Bedside commode  Once       Question:  Patient needs a bedside commode to treat with the following condition   Answer:  History of hip replacement   01/01/23 1540            Diagnostic Studies: DG HIP UNILAT WITH PELVIS 1V RIGHT  Result Date: 01/01/2023 CLINICAL DATA:  Right hip replacement. EXAM: DG HIP (WITH OR WITHOUT PELVIS) 1V RIGHT COMPARISON:  None Available. FINDINGS: Four fluoroscopic spot  views of the pelvis and right hip obtained in the operating room. Sequential images during hip arthroplasty. Fluoroscopy time 30 seconds. Dose 6.19 mGy. IMPRESSION: Intraoperative fluoroscopy for right hip arthroplasty. Electronically Signed   By: Narda Rutherford M.D.   On: 01/01/2023 15:31   DG HIP UNILAT WITH PELVIS 1V RIGHT  Result Date: 01/01/2023 CLINICAL DATA:  Status post surgery. EXAM: DG HIP (WITH OR WITHOUT PELVIS) 1V RIGHT COMPARISON:  None Available. FINDINGS: Right hip arthroplasty in expected alignment. No periprosthetic lucency or fracture. Recent postsurgical change includes air and edema in the soft tissues. IMPRESSION: Right hip arthroplasty without immediate postoperative complication. Electronically Signed   By: Narda Rutherford M.D.   On: 01/01/2023 15:30   DG C-Arm 1-60 Min-No Report  Result Date: 01/01/2023 Fluoroscopy was utilized by the requesting physician.  No radiographic interpretation.   DG C-Arm 1-60 Min-No Report  Result Date: 01/01/2023 Fluoroscopy was utilized by the requesting physician.  No radiographic interpretation.    Disposition: Discharge disposition: 01-Home or Self Care       Discharge Instructions     Call MD / Call 911   Complete by: As directed    If you experience chest pain or shortness of breath, CALL 911 and be transported to the hospital emergency room.  If you develope a fever above 101.5 F, pus (white drainage) or increased drainage or redness at the wound, or calf pain, call your surgeon's office.   Constipation Prevention   Complete by: As directed    Drink plenty of fluids.  Prune juice may be helpful.  You may use a stool softener, such  as Colace (over the counter) 100 mg twice a day.  Use MiraLax (over the counter) for constipation as needed.   Driving restrictions   Complete by: As directed    No driving while taking narcotic pain meds.   Increase activity slowly as tolerated   Complete by: As directed    Post-operative opioid taper instructions:   Complete by: As directed    POST-OPERATIVE OPIOID TAPER INSTRUCTIONS: It is important to wean off of your opioid medication as soon as possible. If you do not need pain medication after your surgery it is ok to stop day one. Opioids include: Codeine, Hydrocodone(Norco, Vicodin), Oxycodone(Percocet, oxycontin) and hydromorphone amongst others.  Long term and even short term use of opiods can cause: Increased pain response Dependence Constipation Depression Respiratory depression And more.  Withdrawal symptoms can include Flu like symptoms Nausea, vomiting And more Techniques to manage these symptoms Hydrate well Eat regular healthy meals Stay active Use relaxation techniques(deep breathing, meditating, yoga) Do Not substitute Alcohol to help with tapering If you have been on opioids for less than two weeks and do not have pain than it is ok to stop all together.  Plan to wean off of opioids This plan should start within one week post op of your joint replacement. Maintain the same interval or time between taking each dose and first decrease the dose.  Cut the total daily intake of opioids by one tablet each day Next start to increase the time between doses. The last dose that should be eliminated is the evening dose.           Follow-up Information     Cristie Hem, PA-C. Schedule an appointment as soon as possible for a visit in 2 week(s).   Specialty: Orthopedic Surgery Contact information: [address] [place] [address] [phone]  [facility] Follow up.   Why: [facility]--the home health agency  will contact you for the first home visit Contact information: [address] [place] [address] [phone]                  Signed: Glee Arvin 01/02/2023, 10:16 AM

## 2023-01-02 NOTE — Progress Notes (Signed)
   Subjective:  Patient reports pain as mild.  No events.  Objective:   VITALS:   Vitals:   01/01/23 1935 01/01/23 2251 01/02/23 0333 01/02/23 0807  BP: (!) 141/78 138/76 118/70 119/64  Pulse: 71 81 84 74  Resp: 18 20 17 17   Temp: 98.2 F (36.8 C) 99.3 F (37.4 C) 98.9 F (37.2 C) 98.6 F (37 C)  TempSrc: Oral Oral Oral Oral  SpO2: 98% 94% 97% 97%  Weight:      Height:        Sensation intact distally Intact pulses distally Dorsiflexion/Plantar flexion intact Incision: dressing C/D/I and no drainage   Lab Results  Component Value Date   WBC 41.5 (H) 01/01/2023   HGB 12.1 (L) 01/01/2023   HCT 39.0 01/01/2023   MCV 91.1 01/01/2023   PLT 176 01/01/2023     Assessment/Plan:  1 Day Post-Op   - Expected postop acute blood loss anemia - Up with PT/OT - DVT ppx - SCDs, ambulation, aspirin - WBAT operative extremity - Pain control - Discharge planning - home today with HHPT  Glee Arvin 01/02/2023, 10:15 AM

## 2023-01-02 NOTE — Evaluation (Signed)
Physical Therapy Evaluation  Patient Details Name: Kevin Patterson MRN: [id number] DOB: 06-10-1955 Today's Date: 01/02/2023  History of Present Illness  Pt is a 67 y/o male who presents s/p R THR direct anterior approach on 01/01/2023. PMH significant for CLL, cyst of kidney, kidney stones, HTN, back surgery, L TKR 2016, R TKR 2022.  Clinical Impression  Pt admitted with above diagnosis. Pt currently with functional limitations due to the deficits listed below (see PT Problem List). At the time of PT eval pt was able to perform transfers with up to min assist and ambulation with gross supervision for safety to modified independent and RW for support. Reviewed HEP handout and pt completed stair training. Pt will benefit from acute skilled PT to increase their independence and safety with mobility to allow discharge.           If plan is discharge home, recommend the following: A little help with walking and/or transfers;A little help with bathing/dressing/bathroom;Assistance with cooking/housework;Help with stairs or ramp for entrance;Assist for transportation   Can travel by private vehicle        Equipment Recommendations None recommended by PT  Recommendations for Other Services       Functional Status Assessment Patient has had a recent decline in their functional status and demonstrates the ability to make significant improvements in function in a reasonable and predictable amount of time.     Precautions / Restrictions Precautions Precautions: Fall Precaution Comments: Direct anterior approach, no precautions. Restrictions Weight Bearing Restrictions: Yes RLE Weight Bearing: Weight bearing as tolerated      Mobility  Bed Mobility Overal bed mobility: Needs Assistance Bed Mobility: Sit to Supine       Sit to supine: Min assist   General bed mobility comments: Assist for LE elevation back up into bed at end of session.    Transfers Overall transfer level: Modified  independent Equipment used: Rolling walker (2 wheels) Transfers: Sit to/from Stand             General transfer comment: No assist required. VC's for hand placement on seated surface for safety.    Ambulation/Gait Ambulation/Gait assistance: Modified independent (Device/Increase time), Supervision Gait Distance (Feet): 560 Feet Assistive device: Rolling walker (2 wheels) Gait Pattern/deviations: Step-through pattern, Decreased stride length, Trunk flexed, Narrow base of support, Decreased dorsiflexion - right Gait velocity: Decreased Gait velocity interpretation: 1.31 - 2.62 ft/sec, indicative of limited community ambulator   General Gait Details: VC's for improved posture, closer walker proximity and forward gaze. No assist required for unsteadiness and no overt LOB noted. Pt with decreased heel strike on the R due to baseline deficits (nerve damage) however able to improve some with cues. Supervision progressing to mod I by end of session.  Stairs Stairs: Yes Stairs assistance: Supervision Stair Management: One rail Right, Step to pattern, Forwards Number of Stairs: 3 General stair comments: VC's for sequencing and general safety. No assist required.  Wheelchair Mobility     Tilt Bed    Modified Rankin (Stroke Patients Only)       Balance Overall balance assessment: Needs assistance Sitting-balance support: Feet supported, No upper extremity supported Sitting balance-Leahy Scale: Fair     Standing balance support: Single extremity supported, During functional activity Standing balance-Leahy Scale: Fair Standing balance comment: statically                             Pertinent Vitals/Pain Pain Assessment Pain Assessment: Faces  Faces Pain Scale: Hurts little more Pain Location: R hip after exercise Pain Descriptors / Indicators: Operative site guarding, Sore Pain Intervention(s): Limited activity within patient's tolerance, Monitored during session,  Repositioned    Home Living Family/patient expects to be discharged to:: Private residence Living Arrangements: Spouse/significant other Available Help at Discharge: Family Type of Home: House Home Access: Stairs to enter Entrance Stairs-Rails: Right;Left;Can reach both Entrance Stairs-Number of Steps: 3   Home Layout: Two level;Able to live on main level with bedroom/bathroom Home Equipment: Agricultural consultant (2 wheels);BSC/3in1      Prior Function Prior Level of Function : Independent/Modified Independent                     Extremity/Trunk Assessment   Upper Extremity Assessment Upper Extremity Assessment: Overall WFL for tasks assessed    Lower Extremity Assessment Lower Extremity Assessment: RLE deficits/detail RLE Deficits / Details: Decreased strength and AROM consistent with above mentioned surgery. RLE Sensation: decreased proprioception (Pt reports after knee relacement he has area of nerv damage and numbness of last 3 toes)    Cervical / Trunk Assessment Cervical / Trunk Assessment: Normal  Communication   Communication Communication: No apparent difficulties Cueing Techniques: Verbal cues;Gestural cues  Cognition Arousal: Alert Behavior During Therapy: WFL for tasks assessed/performed Overall Cognitive Status: Within Functional Limits for tasks assessed                                          General Comments      Exercises Total Joint Exercises Ankle Circles/Pumps: 10 reps, Supine Quad Sets: 15 reps, Supine Short Arc Quad: 15 reps, Supine Heel Slides: 15 reps, Supine Hip ABduction/ADduction: 15 reps, Supine, Standing Long Arc Quad: 15 reps, Seated Knee Flexion: 15 reps, Standing Marching in Standing: 10 reps, Standing Standing Hip Extension: 15 reps, Standing   Assessment/Plan    PT Assessment Patient needs continued PT services  PT Problem List Decreased strength;Decreased range of motion;Decreased activity  tolerance;Decreased balance;Decreased mobility;Decreased knowledge of use of DME;Decreased safety awareness;Decreased knowledge of precautions;Impaired sensation;Pain       PT Treatment Interventions DME instruction;Gait training;Stair training;Functional mobility training;Therapeutic activities;Therapeutic exercise;Balance training;Patient/family education    PT Goals (Current goals can be found in the Care Plan section)  Acute Rehab PT Goals Patient Stated Goal: Home today PT Goal Formulation: With patient/family Time For Goal Achievement: 01/09/23 Potential to Achieve Goals: Good    Frequency Min 1X/week     Co-evaluation               AM-PAC PT "6 Clicks" Mobility  Outcome Measure Help needed turning from your back to your side while in a flat bed without using bedrails?: None Help needed moving from lying on your back to sitting on the side of a flat bed without using bedrails?: A Little Help needed moving to and from a bed to a chair (including a wheelchair)?: A Little Help needed standing up from a chair using your arms (e.g., wheelchair or bedside chair)?: None Help needed to walk in hospital room?: A Little Help needed climbing 3-5 steps with a railing? : A Little 6 Click Score: 20    End of Session Equipment Utilized During Treatment: Gait belt Activity Tolerance: Patient tolerated treatment well Patient left: in bed;with call bell/phone within reach;with family/visitor present Nurse Communication: Mobility status PT Visit Diagnosis: Unsteadiness on feet (R26.81);Pain Pain -  Right/Left: Right Pain - part of body: Hip    Time: 0829-0910 PT Time Calculation (min) (ACUTE ONLY): 41 min   Charges:   PT Evaluation $PT Eval Moderate Complexity: 1 Mod PT Treatments $Gait Training: 8-22 mins $Therapeutic Exercise: 8-22 mins PT General Charges $$ ACUTE PT VISIT: 1 Visit         Conni Slipper, PT, DPT Acute Rehabilitation Services Secure Chat  Preferred Office: [phone]   Marylynn Pearson 01/02/2023, 10:12 AM

## 2023-01-03 ENCOUNTER — Ambulatory Visit: Payer: 59

## 2023-01-07 ENCOUNTER — Encounter: Payer: 59 | Admitting: Physician Assistant

## 2023-01-08 ENCOUNTER — Other Ambulatory Visit: Payer: Self-pay

## 2023-01-08 ENCOUNTER — Telehealth: Payer: Self-pay | Admitting: Physician Assistant

## 2023-01-08 ENCOUNTER — Ambulatory Visit (HOSPITAL_COMMUNITY)
Admission: RE | Admit: 2023-01-08 | Discharge: 2023-01-08 | Disposition: A | Discharge status: Home / Self Care | Payer: 59 | Source: Ambulatory Visit | Attending: Orthopaedic Surgery | Admitting: Orthopaedic Surgery

## 2023-01-08 DIAGNOSIS — M79661 Pain in right lower leg: Secondary | ICD-10-CM

## 2023-01-08 NOTE — Telephone Encounter (Signed)
[employer] with [facility] lab. He will review and contact ptl

## 2023-01-08 NOTE — Telephone Encounter (Signed)
Can you schedule u/s rle r/o dvt.  Leg swelling and calf pain f/u tha

## 2023-01-08 NOTE — Telephone Encounter (Signed)
Pt called stating he is still experiencing severe pains and swelling in his right leg. Please call pt at [phone]

## 2023-01-16 ENCOUNTER — Ambulatory Visit (INDEPENDENT_AMBULATORY_CARE_PROVIDER_SITE_OTHER): Payer: 59 | Admitting: Physician Assistant

## 2023-01-16 ENCOUNTER — Encounter: Payer: Self-pay | Admitting: Physician Assistant

## 2023-01-16 ENCOUNTER — Other Ambulatory Visit: Payer: Self-pay | Admitting: Physician Assistant

## 2023-01-16 ENCOUNTER — Telehealth: Payer: Self-pay | Admitting: Physician Assistant

## 2023-01-16 DIAGNOSIS — Z96641 Presence of right artificial hip joint: Secondary | ICD-10-CM

## 2023-01-16 MED ORDER — TRAMADOL HCL 50 MG PO TABS
50.0000 mg | ORAL_TABLET | Freq: Three times a day (TID) | ORAL | 0 refills | Status: AC | PRN
Start: 2023-01-16 — End: ?

## 2023-01-16 NOTE — Telephone Encounter (Signed)
Pt is requesting a medication that was previous discussed at appt. Preferably non-opioid. Pt best call back number [phone]

## 2023-01-16 NOTE — Telephone Encounter (Signed)
Sent in tramadol to pharmacy on file

## 2023-01-16 NOTE — Progress Notes (Signed)
Post-Op Visit Note   Patient: Kevin Patterson           Date of Birth: 14-Apr-1956           MRN: [id number] Visit Date: 01/16/2023 PCP: Cheron Schaumann., MD   Assessment & Plan:  Chief Complaint:  Chief Complaint  Patient presents with   Right Hip - Follow-up    Right total hip arthroplasty 01/01/2023   Visit Diagnoses:  1. Status post total replacement of right hip     Plan: Patient is a pleasant 67 year old gentleman who comes in today 2 weeks status post right total hip replacement 01/01/2023.  He has been doing okay.  He noticed increased pain and muscle tightness after his first session of home health therapy.  This appears to be when he has most of his pain.  He has been taking ibuprofen and Robaxin.  Not taking any narcotic pain medication.  He has been compliant taking a baby aspirin twice daily for DVT prophylaxis.  Examination of his right hip reveals a well-healed surgical incision with nylon sutures in place.  No evidence of infection or cellulitis.  He does have a very small seroma which I do not feel is large enough to drain.  He will use ice and heat.  Continue taking his baby aspirin twice daily for DVT prophylaxis.  We have taken out the stitches today and applied Steri-Strips.  I have told him to back off on the home exercises and really focus on walking.  He will follow-up with [place] in 4 weeks for repeat evaluation and AP pelvis x-rays.  Follow-Up Instructions: Return in about 4 weeks (around 02/13/2023).   Orders:  No orders of the defined types were placed in this encounter.  No orders of the defined types were placed in this encounter.   Imaging: No new imaging  PMFS History: Patient Active Problem List   Diagnosis Date Noted   Status post total replacement of right hip 01/01/2023   Primary osteoarthritis of right hip 12/08/2022   Arthritis of right knee    S/P total knee replacement, right 04/10/2021   DJD (degenerative joint disease) of knee  03/13/2015   Past Medical History:  Diagnosis Date   Arthritis    CLL (chronic lymphocytic leukemia) (HCC)    Cyst of kidney, acquired    history of , monitored   High cholesterol    History of kidney stones    x 2 - passed stones, no surgery required   Hypertension     No family history on file.  Past Surgical History:  Procedure Laterality Date   APPENDECTOMY     BACK SURGERY     COLONOSCOPY     x several   JOINT REPLACEMENT Left    left knee   KNEE ARTHROTOMY Bilateral 12/28/1968   open cleanout   TONSILLECTOMY     TONSILLECTOMY AND ADENOIDECTOMY     TOTAL HIP ARTHROPLASTY Right 01/01/2023   Procedure: RIGHT TOTAL HIP ARTHROPLASTY ANTERIOR APPROACH;  Surgeon: Tarry Kos, MD;  Location: [facility] OR;  Service: Orthopedics;  Laterality: Right;  3-C   TOTAL KNEE ARTHROPLASTY Left 03/13/2015   Procedure: LEFT TOTAL KNEE ARTHROPLASTY;  Surgeon: Salvatore Marvel, MD;  Location: [facility] OR;  Service: Orthopedics;  Laterality: Left;   TOTAL KNEE ARTHROPLASTY Right 04/10/2021   Procedure: RIGHT TOTAL KNEE ARTHROPLASTY;  Surgeon: Cammy Copa, MD;  Location: [facility] OR;  Service: Orthopedics;  Laterality: Right;   WISDOM TOOTH EXTRACTION  Social History   Occupational History   Not on file  Tobacco Use   Smoking status: Former    Current packs/day: 0.00    Types: Cigarettes    Quit date: 04/29/1973    Years since quitting: 49.7   Smokeless tobacco: Current  Vaping Use   Vaping status: Every Day   Substances: THC, CBD  Substance and Sexual Activity   Alcohol use: Not Currently    Alcohol/week: 3.0 standard drinks of alcohol    Types: 3 Standard drinks or equivalent per week   Drug use: Yes    Comment: CBD oil and gummies   Sexual activity: Yes

## 2023-01-16 NOTE — Telephone Encounter (Signed)
Notified patient.

## 2023-01-20 DIAGNOSIS — M1611 Unilateral primary osteoarthritis, right hip: Secondary | ICD-10-CM

## 2023-01-23 ENCOUNTER — Other Ambulatory Visit: Payer: Self-pay

## 2023-01-23 DIAGNOSIS — C9110 Chronic lymphocytic leukemia of B-cell type not having achieved remission: Secondary | ICD-10-CM

## 2023-01-24 ENCOUNTER — Inpatient Hospital Stay: Payer: 59 | Admitting: Hematology

## 2023-01-24 ENCOUNTER — Inpatient Hospital Stay: Payer: 59 | Attending: Hematology

## 2023-01-24 VITALS — BP 138/74 | HR 70 | Temp 99.3°F | Resp 17 | Ht 75.0 in | Wt 235.9 lb

## 2023-01-24 DIAGNOSIS — Z7982 Long term (current) use of aspirin: Secondary | ICD-10-CM | POA: Diagnosis not present

## 2023-01-24 DIAGNOSIS — C9110 Chronic lymphocytic leukemia of B-cell type not having achieved remission: Secondary | ICD-10-CM | POA: Insufficient documentation

## 2023-01-24 DIAGNOSIS — Z79899 Other long term (current) drug therapy: Secondary | ICD-10-CM | POA: Insufficient documentation

## 2023-01-24 DIAGNOSIS — Z87891 Personal history of nicotine dependence: Secondary | ICD-10-CM | POA: Diagnosis not present

## 2023-01-24 LAB — CMP (CANCER CENTER ONLY)
ALT: 10 U/L (ref 0–44)
AST: 18 U/L (ref 15–41)
Albumin: 4.2 g/dL (ref 3.5–5.0)
Alkaline Phosphatase: 109 U/L (ref 38–126)
Anion gap: 6 (ref 5–15)
BUN: 13 mg/dL (ref 8–23)
CO2: 28 mmol/L (ref 22–32)
Calcium: 9.5 mg/dL (ref 8.9–10.3)
Chloride: 105 mmol/L (ref 98–111)
Creatinine: 1.04 mg/dL (ref 0.61–1.24)
GFR, Estimated: >60 mL/min (ref >60)
Glucose, Bld: 109 mg/dL — ABNORMAL HIGH (ref 70–99)
Potassium: 4.1 mmol/L (ref 3.5–5.1)
Sodium: 139 mmol/L (ref 135–145)
Total Bilirubin: 0.7 mg/dL (ref 0.3–1.2)
Total Protein: 7.1 g/dL (ref 6.5–8.1)

## 2023-01-24 LAB — CBC WITH DIFFERENTIAL (CANCER CENTER ONLY)
Abs Immature Granulocytes: 0.08 10*3/uL — ABNORMAL HIGH (ref 0.00–0.07)
Basophils Absolute: 0.1 10*3/uL (ref 0.0–0.1)
Basophils Relative: 0 %
Eosinophils Absolute: 0.2 10*3/uL (ref 0.0–0.5)
Eosinophils Relative: 0 %
HCT: 31 % — ABNORMAL LOW (ref 39.0–52.0)
Hemoglobin: 9.7 g/dL — ABNORMAL LOW (ref 13.0–17.0)
Immature Granulocytes: 0 %
Lymphocytes Relative: 79 %
Lymphs Abs: 30.9 10*3/uL — ABNORMAL HIGH (ref 0.7–4.0)
MCH: 28.8 pg (ref 26.0–34.0)
MCHC: 31.3 g/dL (ref 30.0–36.0)
MCV: 92 fL (ref 80.0–100.0)
Monocytes Absolute: 4.5 10*3/uL — ABNORMAL HIGH (ref 0.1–1.0)
Monocytes Relative: 11 %
Neutro Abs: 3.8 10*3/uL (ref 1.7–7.7)
Neutrophils Relative %: 10 %
Platelet Count: 316 10*3/uL (ref 150–400)
RBC: 3.37 MIL/uL — ABNORMAL LOW (ref 4.22–5.81)
RDW: 14.2 % (ref 11.5–15.5)
Smear Review: NORMAL
WBC Count: 39.6 10*3/uL — ABNORMAL HIGH (ref 4.0–10.5)
nRBC: 0 % (ref 0.0–0.2)

## 2023-01-24 LAB — LACTATE DEHYDROGENASE: LDH: 209 U/L — ABNORMAL HIGH (ref 98–192)

## 2023-01-24 NOTE — Progress Notes (Signed)
Kevin Patterson   HEMATOLOGY/ONCOLOGY CLINIC NOTE  Date of Service: 01/24/23   Patient Care Team: Cheron Schaumann., MD as PCP - General (Internal Medicine)  CHIEF COMPLAINTS/PURPOSE OF CONSULTATION:   Follow-up for continued evaluation and management of CLL  HISTORY OF PRESENTING ILLNESS:  Please see previous note for details initial presentation  INTERVAL HISTORY: [name] is a 67 y.o. male here for follow-up for continued evaluation and management of his recently diagnosed CLL.    Patient was last seen by me on 05/27/2022 and reported a mild scab in his right leg below the knee and mild cold symptoms.  He was admitted to the hospital on 01/01/2023 for osteoarthritis of the right hip and did receive a right total hip arthroplasty anterior approach.   Today, he is accompanied by his wife. Patient notes a complication from his recent surgery in his right thigh involving knee swelling. There were no concerns of blood clots. This has since improved and he is gradually returning to his baseline. Patient denies any new lumps/bumps in other area. He did have previous right knee surgery in 2022. Patient does not engage in physical therapy at this time. He regularly takes 9K steps daily.   Patient continues to take aspirin twice a day and does not take any other blood thinners. He reports that after taking Tylenol, he did notice a burning sensation in his skin, which has resolved. Patient did previously use Tylenol a while ago and denies having any issues at that time. He does regularly take vitamin B12 and iron supplements.  Patient has not received the RSV vaccine, though he has received the shingles vaccine. He never received the COVID-19 vaccine by choice. Patient was previously infected with COVID-19.   Patient does report endorsing night sweats during his recovery time following surgery. He complains of mild fatigue. Patient denies any new skin rashes, infection issues, fever, chills,  abdominal pain, or change in bowel/urinary habits.  He reports that there were no concerning findings with his last PSA test.   MEDICAL HISTORY:  Past Medical History:  Diagnosis Date   Arthritis    CLL (chronic lymphocytic leukemia) (HCC)    Cyst of kidney, acquired    history of , monitored   High cholesterol    History of kidney stones    x 2 - passed stones, no surgery required   Hypertension     SURGICAL HISTORY: Past Surgical History:  Procedure Laterality Date   APPENDECTOMY     BACK SURGERY     COLONOSCOPY     x several   JOINT REPLACEMENT Left    left knee   KNEE ARTHROTOMY Bilateral 12/28/1968   open cleanout   TONSILLECTOMY     TONSILLECTOMY AND ADENOIDECTOMY     TOTAL HIP ARTHROPLASTY Right 01/01/2023   Procedure: RIGHT TOTAL HIP ARTHROPLASTY ANTERIOR APPROACH;  Surgeon: Tarry Kos, MD;  Location: [facility] OR;  Service: Orthopedics;  Laterality: Right;  3-C   TOTAL KNEE ARTHROPLASTY Left 03/13/2015   Procedure: LEFT TOTAL KNEE ARTHROPLASTY;  Surgeon: Salvatore Marvel, MD;  Location: [facility] OR;  Service: Orthopedics;  Laterality: Left;   TOTAL KNEE ARTHROPLASTY Right 04/10/2021   Procedure: RIGHT TOTAL KNEE ARTHROPLASTY;  Surgeon: Cammy Copa, MD;  Location: [facility] OR;  Service: Orthopedics;  Laterality: Right;   WISDOM TOOTH EXTRACTION      SOCIAL HISTORY: Social History   Socioeconomic History   Marital status: Married    Spouse name: Not on file  Number of children: Not on file   Years of education: Not on file   Highest education level: Not on file  Occupational History   Not on file  Tobacco Use   Smoking status: Former    Current packs/day: 0.00    Types: Cigarettes    Quit date: 04/29/1973    Years since quitting: 49.7   Smokeless tobacco: Current  Vaping Use   Vaping status: Every Day   Substances: THC, CBD  Substance and Sexual Activity   Alcohol use: Not Currently    Alcohol/week: 3.0 standard drinks of alcohol    Types: 3 Standard drinks or  equivalent per week   Drug use: Yes    Comment: CBD oil and gummies   Sexual activity: Yes  Other Topics Concern   Not on file  Social History Narrative   Not on file   Social Determinants of Health   Financial Resource Strain: Not on file  Food Insecurity: Low Risk  (10/21/2022)   [facility]   Hunger Vital Sign    Worried About Running Out of Food in the Last Year: Never true    Ran Out of Food in the Last Year: Never true  Transportation Needs: Not on file (10/21/2022)  Physical Activity: Not on file  Stress: Not on file  Social Connections: Unknown (07/05/2022)   [facility]   Social Network    Social Network: Not on file  Intimate Partner Violence: Unknown (07/05/2022)   Received from [facility]   HITS    Physically Hurt: Not on file    Insult or Talk Down To: Not on file    Threaten Physical Harm: Not on file    Scream or Curse: Not on file    FAMILY HISTORY: No reported family history of known blood disorders or cancers.  ALLERGIES:  is allergic to other.  MEDICATIONS:  Current Outpatient Medications  Medication Sig Dispense Refill   traMADol (ULTRAM) 50 MG tablet Take 1 tablet (50 mg total) by mouth 3 (three) times daily as needed. 30 tablet 0   Ascorbic Acid (VITAMIN C) 1000 MG tablet Take 1,000 mg by mouth daily.     Ashwagandha 500 MG CAPS Take 500 mg by mouth daily.     aspirin EC 81 MG tablet Take 1 tablet (81 mg total) by mouth 2 (two) times daily. To be taken after surgery to prevent blood clots 84 tablet 0   Aspirin-Caffeine (BC FAST PAIN RELIEF PO) Take 1 packet by mouth daily as needed (pain).     Calcium Citrate-Vitamin D (CALCIUM + D PO) Take 1 tablet by mouth daily.     cholecalciferol (VITAMIN D3) 25 MCG (1000 UNIT) tablet Take 1,000 Units by mouth daily.     docusate sodium (COLACE) 100 MG capsule Take 1 capsule (100 mg total) by mouth daily as needed. 30 capsule 2    methocarbamol (ROBAXIN-750) 750 MG tablet Take 1 tablet (750 mg total) by mouth 2 (two) times daily as needed for muscle spasms. 20 tablet 2   milk thistle 175 MG tablet Take 175 mg by mouth daily.     olmesartan-hydrochlorothiazide (BENICAR HCT) 40-12.5 MG tablet Take 1 tablet by mouth daily.     olopatadine (PATANOL) 0.1 % ophthalmic solution Place 1 drop into both eyes 2 (two) times daily as needed for allergies.     oxyCODONE-acetaminophen (PERCOCET) 5-325 MG tablet Take 1-2 tablets by mouth every 6 (  six) hours as needed. To be taken after surgery 40 tablet 0   pravastatin (PRAVACHOL) 40 MG tablet Take 40 mg by mouth daily.     sodium chloride (OCEAN) 0.65 % SOLN nasal spray Place 1 spray into both nostrils as needed for congestion.     tadalafil (CIALIS) 5 MG tablet Take 5 mg by mouth daily as needed for erectile dysfunction.     Vitamin A 2400 MCG (8000 UT) CAPS Take 8,000 Units by mouth daily.     vitamin B-12 (CYANOCOBALAMIN) 500 MCG tablet Take 500 mcg by mouth daily.     zinc gluconate 50 MG tablet Take 50 mg by mouth daily.     No current facility-administered medications for this visit.    REVIEW OF SYSTEMS:   10 Point review of Systems was done is negative except as noted above.  PHYSICAL EXAMINATION: ECOG PERFORMANCE STATUS: 1  . Vitals:   01/24/23 1350  BP: 138/74  Pulse: 70  Resp: 17  Temp: 99.3 F (37.4 C)  SpO2: 100%    Filed Weights   01/24/23 1350  Weight: 235 lb 14.4 oz (107 kg)   .Body mass index is 29.49 kg/m. NAD GENERAL:alert, in no acute distress and comfortable SKIN: no acute rashes, no significant lesions EYES: conjunctiva are pink and non-injected, sclera anicteric NECK: supple, no JVD LYMPH:  no palpable lymphadenopathy in the cervical, axillary or inguinal regions LUNGS: clear to auscultation b/l with normal respiratory effort HEART: regular rate & rhythm ABDOMEN:  normoactive bowel sounds , non tender, not distended. Extremity: no pedal  edema PSYCH: alert & oriented x 3 with fluent speech NEURO: no focal motor/sensory deficits  LABORATORY DATA:  I have reviewed the data as listed     Latest Ref Rng & Units 01/24/2023    1:07 PM 01/01/2023   10:39 AM 11/29/2022   11:46 AM  CBC  WBC 4.0 - 10.5 K/uL 39.6  41.5  48.8   Hemoglobin 13.0 - 17.0 g/dL 9.7  81.1  91.4   Hematocrit 39.0 - 52.0 % 31.0  39.0  42.7   Platelets 150 - 400 K/uL 316  176  190    .CBC    Component Value Date/Time   WBC 39.6 (H) 01/24/2023 1307   WBC 41.5 (H) 01/01/2023 1039   RBC 3.37 (L) 01/24/2023 1307   HGB 9.7 (L) 01/24/2023 1307   HCT 31.0 (L) 01/24/2023 1307   PLT 316 01/24/2023 1307   MCV 92.0 01/24/2023 1307   MCH 28.8 01/24/2023 1307   MCHC 31.3 01/24/2023 1307   RDW 14.2 01/24/2023 1307   LYMPHSABS 30.9 (H) 01/24/2023 1307   MONOABS 4.5 (H) 01/24/2023 1307   EOSABS 0.2 01/24/2023 1307   BASOSABS 0.1 01/24/2023 1307     .    Latest Ref Rng & Units 01/24/2023    1:07 PM 01/01/2023   10:39 AM 11/29/2022   11:46 AM  CMP  Glucose 70 - 99 mg/dL 782  956  99   BUN 8 - 23 mg/dL 13  9  14    Creatinine 0.61 - 1.24 mg/dL 2.13  0.86  5.78   Sodium 135 - 145 mmol/L 139  140  136   Potassium 3.5 - 5.1 mmol/L 4.1  5.3  6.1   Chloride 98 - 111 mmol/L 105  106  104   CO2 22 - 32 mmol/L 28  24  26    Calcium 8.9 - 10.3 mg/dL 9.5  9.1  9.0  Total Protein 6.5 - 8.1 g/dL 7.1     Total Bilirubin 0.3 - 1.2 mg/dL 0.7     Alkaline Phos 38 - 126 U/L 109     AST 15 - 41 U/L 18     ALT 0 - 44 U/L 10      . Lab Results  Component Value Date   LDH 115 05/27/2022    Component     Latest Ref Rng & Units 05/09/2021  DAT, complement      NEG  DAT, IgG      NEG . . .  Ferritin     24 - 336 ng/mL 193  Vitamin B12     180 - 914 pg/mL 479  Haptoglobin     32 - 363 mg/dL 914  HIV Screen 4th Generation wRfx     Non Reactive Non Reactive  HCV Ab     NON REACTIVE NON REACTIVE  Hep B Core Total Ab     NON REACTIVE NON REACTIVE  Hepatitis B  Surface Ag     NON REACTIVE NON REACTIVE  LDH     98 - 192 U/L 133      RADIOGRAPHIC STUDIES: I have personally reviewed the radiological images as listed and agreed with the findings in the report.  PET/CT 05/17/2021:IMPRESSION: 1. Hypermetabolic prominent/mildly enlarged right pelvic lymph nodes, with activity greater than liver ([place] 4).   2. Low level metabolic activity within prominent bilateral axillary, retroperitoneal and left pelvic sidewall lymph nodes.   3.  No splenomegaly.   4. Asymmetric nodular hyperactivity on the right side of the prostate gland with a max SUV of 7.26 without discrete CT correlate, nonspecific but prostate carcinoma not excluded, recommend correlation with PSA and urology consult.   5. Hypermetabolic focus in the left hemipelvis along the gonadal vessels with a max SUV of 6.75, without discrete CT correlate and possibly vascular in etiology, attention on follow-up imaging suggested.   6. Hypermetabolic hyperplasia of the tonsils slightly asymmetric to the right with a max SUV of 5.1 common likely reactive.   7. Bilateral symmetric diffuse parotid gland hypermetabolism with max SUV of 4.9 on the right and 4.7 on the left without focal hypermetabolism or CT correlate, likely inflammatory     Electronically Signed   By: Maudry Mayhew M.D.   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 Incidentally noted on labs in the context of preoperative labs prior to his recent right TKA. CLL FISH panel with 11 q. and 13 q. deletions  2) incidental note of  FDG avid prostate lesions. Patient has a family history of prostate cancer. He notes that he has been followed by urology previously for elevated PSA levels and he has had prostate biopsy previously  PLAN:  -Discussed lab results on 01/24/23 in detail with patient. CBC showed WBC of 39.6K, hemoglobin of 9.7, and platelets of 316K. -anemia likely due to hip surgery. His hemoglobin level was 12-13 prior to his right hip surgery.  -mild fatigue is expected with anemia -platelets remain normal, and WBC are stable from a CLL standpoint -CMP normal -LDH .209 -He has  no significant lab changes,constitutional symptoms for any other signs or symptoms of significant CLL progression at this time. - no indication to initiate CLL treatment at this time. -night sweats after surgery may be from inflammation related to pain medications -educated patient that the burning sensation in the skin may be from a pinched nerve in the skin from swollen tissues -continue to move around and stay regularly active  -continue Aspirin to reduce the risk of blood clots -continue to use compression socks to reduce risk of blood clot -continue to stay well hydrated -recommend patient to stay UTD with age-appropriate vaccinations including flu, COVID-19 booster, RSV, pneumonia, and shingrix.  -continue to take vitamin B complex -continue to take oral iron -recommend patient to regularly consume a diet with fresh fruits and vegetables -recommend 2000 units of vitamin D daily to support the immune system and prevent any deficiencies -answered all of patient's questions in detail -continue to follow with urologist to monitor PSA levels  FOLLOW-UP: RTC with Dr Candise Che with labs in 6 months  The total time spent in the appointment was 25 minutes* .  All of the patient's questions were answered with apparent satisfaction. The patient knows to call the clinic with any problems, questions or concerns.   Wyvonnia Lora MD MS  AAHIVMS [facility] Hematology/Oncology Physician [facility]  .*Total Encounter Time as defined by the Centers for Medicare and Medicaid Services includes, in addition to the face-to-face time of a patient visit (documented in the note above) non-face-to-face time: obtaining and reviewing outside history, ordering and reviewing medications, tests or procedures, care coordination (communications with other health care professionals or caregivers) and documentation in the medical record.    I,Mitra Faeizi,acting as a Neurosurgeon for Wyvonnia Lora, MD.,have documented all relevant documentation on the behalf of Wyvonnia Lora, MD,as directed by  Wyvonnia Lora, MD while in the presence of Wyvonnia Lora, MD.  .I have reviewed the above documentation for accuracy and completeness, and I agree with the above. Johney Maine MD

## 2023-02-04 ENCOUNTER — Encounter: Payer: 59 | Admitting: Physician Assistant

## 2023-02-13 ENCOUNTER — Ambulatory Visit: Payer: 59 | Admitting: Orthopaedic Surgery

## 2023-02-13 ENCOUNTER — Other Ambulatory Visit (INDEPENDENT_AMBULATORY_CARE_PROVIDER_SITE_OTHER): Payer: 59

## 2023-02-13 DIAGNOSIS — Z96641 Presence of right artificial hip joint: Secondary | ICD-10-CM

## 2023-02-13 NOTE — Progress Notes (Signed)
Post-Op Visit Note   Patient: Kevin Patterson           Date of Birth: 1956-02-27           MRN: [id number] Visit Date: 02/13/2023 PCP: Cheron Schaumann., MD   Assessment & Plan:  Chief Complaint:  Chief Complaint  Patient presents with   Right Hip - Routine Post Op   Visit Diagnoses:  1. Status post total replacement of right hip     Plan: Kevin Patterson is a 67 year old gentleman here for 6-week postop check.  He is doing well overall.  No real complaints.  Takes Tylenol and ibuprofen as needed.  Exam of the right hip shows fully healed surgical scar.  Fluid painless range of motion of the hip.  Ambulating without a cane.  Kevin Patterson is doing well overall and progressing and rehabbing well.  Work note provided today.  Instructions reviewed.  Recheck in 6 weeks.  Follow-Up Instructions: Return in about 6 weeks (around 03/27/2023) for lindsey.   Orders:  Orders Placed This Encounter  Procedures   XR Pelvis 1-2 Views   No orders of the defined types were placed in this encounter.   Imaging: XR Pelvis 1-2 Views  Result Date: 02/13/2023 X-rays of the pelvis show status post right total hip replacement with dual mobility bearing.  No complications.   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  Past Surgical History:  Procedure Laterality Date   APPENDECTOMY     BACK SURGERY     COLONOSCOPY     x several   JOINT REPLACEMENT  Left    left knee   KNEE ARTHROTOMY Bilateral 12/28/1968   open cleanout   TONSILLECTOMY     TONSILLECTOMY AND ADENOIDECTOMY     TOTAL HIP ARTHROPLASTY Right 01/01/2023   Procedure: RIGHT TOTAL HIP ARTHROPLASTY ANTERIOR APPROACH;  Surgeon: Tarry Kos, MD;  Location: [facility] OR;  Service: Orthopedics;  Laterality: Right;  3-C   TOTAL KNEE ARTHROPLASTY Left 03/13/2015   Procedure: LEFT TOTAL KNEE ARTHROPLASTY;  Surgeon: Salvatore Marvel, MD;  Location: [facility] OR;  Service: Orthopedics;  Laterality: Left;   TOTAL KNEE ARTHROPLASTY Right 04/10/2021   Procedure: RIGHT TOTAL KNEE ARTHROPLASTY;  Surgeon: Cammy Copa, MD;  Location: [facility] OR;  Service: Orthopedics;  Laterality: Right;   WISDOM TOOTH EXTRACTION     Social History   Occupational History   Not on file  Tobacco Use   Smoking status: Former    Current packs/day: 0.00    Types: Cigarettes    Quit date: 04/29/1973    Years since quitting: 49.8   Smokeless tobacco: Current  Vaping Use   Vaping status: Every Day   Substances: THC, CBD  Substance and Sexual Activity   Alcohol use: Not Currently    Alcohol/week: 3.0 standard drinks of alcohol  Types: 3 Standard drinks or equivalent per week   Drug use: Yes    Comment: CBD oil and gummies   Sexual activity: Yes

## 2023-02-15 ENCOUNTER — Encounter: Payer: Self-pay | Admitting: Orthopaedic Surgery

## 2023-02-17 NOTE — Telephone Encounter (Signed)
yes

## 2023-03-20 ENCOUNTER — Encounter: Payer: Self-pay | Admitting: Physician Assistant

## 2023-03-20 ENCOUNTER — Ambulatory Visit: Payer: 59 | Admitting: Physician Assistant

## 2023-03-20 DIAGNOSIS — Z96641 Presence of right artificial hip joint: Secondary | ICD-10-CM

## 2023-03-20 NOTE — Progress Notes (Signed)
Post-Op Visit Note   Patient: Kevin Patterson           Date of Birth: 06/15/1955           MRN: [id number] Visit Date: 03/20/2023 PCP: Cheron Schaumann., MD   Assessment & Plan:  Chief Complaint:  Chief Complaint  Patient presents with   Right Hip - Follow-up, Routine Post Op   Visit Diagnoses:  1. Status post total replacement of right hip     Plan: Patient is a pleasant 67 year old gentleman who comes in today 3 months status post right total hip replacement 01/01/2023.  He has been doing well.  He notes some minor soreness but nothing more.  Continuing to work on a home exercise program.  Examination of his right hip reveals painless hip flexion although this is somewhat limited secondary to strength.  Painless logroll.  He is neurovascularly intact distally.  At this point, would like for him to continue working on strengthening exercises.  Dental prophylaxis reinforced.  Follow-up in 3 months for repeat evaluation and AP pelvis x-rays.  Call with concerns or questions.  Follow-Up Instructions: Return in about 3 months (around 06/20/2023).   Orders:  No orders of the defined types were placed in this encounter.  No orders of the defined types were placed in this encounter.   Imaging: No new imaging  PMFS History: Patient Active Problem List   Diagnosis Date Noted   Status post total replacement of right hip 01/01/2023   Primary osteoarthritis of right hip 12/08/2022   Arthritis of right knee    S/P total knee replacement, right 04/10/2021   DJD (degenerative joint disease) of knee 03/13/2015   Past Medical History:  Diagnosis Date   Arthritis    CLL (chronic lymphocytic leukemia) (HCC)    Cyst of kidney, acquired    history of , monitored   High cholesterol    History of kidney stones    x 2 - passed stones, no surgery required   Hypertension     History reviewed. No pertinent family history.  Past Surgical History:  Procedure Laterality Date    APPENDECTOMY     BACK SURGERY     COLONOSCOPY     x several   JOINT REPLACEMENT Left    left knee   KNEE ARTHROTOMY Bilateral 12/28/1968   open cleanout   TONSILLECTOMY     TONSILLECTOMY AND ADENOIDECTOMY     TOTAL HIP ARTHROPLASTY Right 01/01/2023   Procedure: RIGHT TOTAL HIP ARTHROPLASTY ANTERIOR APPROACH;  Surgeon: Tarry Kos, MD;  Location: [facility] OR;  Service: Orthopedics;  Laterality: Right;  3-C   TOTAL KNEE ARTHROPLASTY Left 03/13/2015   Procedure: LEFT TOTAL KNEE ARTHROPLASTY;  Surgeon: Salvatore Marvel, MD;  Location: [facility] OR;  Service: Orthopedics;  Laterality: Left;   TOTAL KNEE ARTHROPLASTY Right 04/10/2021   Procedure: RIGHT TOTAL KNEE ARTHROPLASTY;  Surgeon: Cammy Copa, MD;  Location: [facility] OR;  Service: Orthopedics;  Laterality: Right;   WISDOM TOOTH EXTRACTION     Social History   Occupational History   Not on file  Tobacco Use   Smoking status: Former    Current packs/day: 0.00    Types: Cigarettes    Quit date: 04/29/1973    Years since quitting: 49.9   Smokeless tobacco: Current  Vaping Use   Vaping status: Every Day   Substances: THC, CBD  Substance and Sexual Activity   Alcohol use: Not Currently    Alcohol/week: 3.0 standard drinks of  alcohol    Types: 3 Standard drinks or equivalent per week   Drug use: Yes    Comment: CBD oil and gummies   Sexual activity: Yes

## 2023-06-20 ENCOUNTER — Ambulatory Visit: Payer: Managed Care, Other (non HMO) | Admitting: Orthopaedic Surgery

## 2023-06-20 ENCOUNTER — Other Ambulatory Visit (INDEPENDENT_AMBULATORY_CARE_PROVIDER_SITE_OTHER): Payer: Managed Care, Other (non HMO)

## 2023-06-20 DIAGNOSIS — Z96641 Presence of right artificial hip joint: Secondary | ICD-10-CM | POA: Diagnosis not present

## 2023-06-20 NOTE — Progress Notes (Signed)
Post-Op Visit Note   Patient: Kevin Patterson           Date of Birth: May 07, 1955           MRN: [id number] Visit Date: 06/20/2023 PCP: Cheron Schaumann., MD   Assessment & Plan:  Chief Complaint:  Chief Complaint  Patient presents with   Right Hip - Follow-up    Right total hip arthroplasty 01/01/2023   Visit Diagnoses:  1. Status post total replacement of right hip     Plan: Mr. Polan is approximately 6 months postop from a right total hip replacement.  He is doing well overall.  Has muscular pain after exertion.  He has stiffness with sit to stand and in the morning.  He drives a lot for work.  He has to walk a lot for work as well.  Examination of right hip shows fluid painless range of motion.  Leg lengths are equal.  Fully healed surgical scar.  His implant looks good on x-rays.  I think his symptoms are related to muscular overexertion.  He is free to go to the gym to do muscular strengthening.  Restrictions were discussed.  Dental prophylaxis reinforced.  Recheck in 6 months with repeat radiographs.  Follow-Up Instructions: Return in about 6 months (around 12/18/2023).   Orders:  Orders Placed This Encounter  Procedures   XR Pelvis 1-2 Views   No orders of the defined types were placed in this encounter.   Imaging: XR Pelvis 1-2 Views Result Date: 06/20/2023 X-rays of the right hip show a stable right total hip arthroplasty with a dual mobility bearing   PMFS History: Patient Active Problem List   Diagnosis Date Noted   Status post total replacement of right hip 01/01/2023   Primary osteoarthritis of right hip 12/08/2022   Arthritis of right knee    S/P total knee replacement, right 04/10/2021   DJD (degenerative joint disease) of knee 03/13/2015   Past Medical History:  Diagnosis Date   Arthritis    CLL (chronic lymphocytic leukemia) (HCC)    Cyst of kidney, acquired    history of , monitored   High cholesterol    History of kidney stones     x 2 - passed stones, no surgery required   Hypertension     No family history on file.  Past Surgical History:  Procedure Laterality Date   APPENDECTOMY     BACK SURGERY     COLONOSCOPY     x several   JOINT REPLACEMENT Left    left knee   KNEE ARTHROTOMY Bilateral 12/28/1968   open cleanout   TONSILLECTOMY     TONSILLECTOMY AND ADENOIDECTOMY     TOTAL HIP ARTHROPLASTY Right 01/01/2023   Procedure: RIGHT TOTAL HIP ARTHROPLASTY ANTERIOR APPROACH;  Surgeon: Tarry Kos, MD;  Location: [facility] OR;  Service: Orthopedics;  Laterality: Right;  3-C   TOTAL KNEE ARTHROPLASTY Left 03/13/2015   Procedure: LEFT TOTAL KNEE ARTHROPLASTY;  Surgeon: Salvatore Marvel, MD;  Location[facility] OR;  Service: Orthopedics;  Laterality: Left;   TOTAL KNEE ARTHROPLASTY Right 04/10/2021   Procedure: RIGHT TOTAL KNEE ARTHROPLASTY;  Surgeon: Cammy Copa, MD;  Location: [facility] OR;  Service: Orthopedics;  Laterality: Right;   WISDOM TOOTH EXTRACTION     Social History   Occupational History   Not on file  Tobacco Use   Smoking status: Former    Current packs/day: 0.00    Types: Cigarettes    Quit date: 04/29/1973  Years since quitting: 50.1   Smokeless tobacco: Current  Vaping Use   Vaping status: Every Day   Substances: THC, CBD  Substance and Sexual Activity   Alcohol use: Not Currently    Alcohol/week: 3.0 standard drinks of alcohol    Types: 3 Standard drinks or equivalent per week   Drug use: Yes    Comment: CBD oil and gummies   Sexual activity: Yes

## 2023-07-18 ENCOUNTER — Other Ambulatory Visit: Payer: Self-pay

## 2023-07-18 DIAGNOSIS — C9110 Chronic lymphocytic leukemia of B-cell type not having achieved remission: Secondary | ICD-10-CM

## 2023-07-21 ENCOUNTER — Inpatient Hospital Stay: Payer: 59 | Admitting: Hematology

## 2023-07-21 ENCOUNTER — Inpatient Hospital Stay: Payer: 59

## 2023-07-22 ENCOUNTER — Other Ambulatory Visit: Payer: Self-pay

## 2023-07-22 ENCOUNTER — Inpatient Hospital Stay (HOSPITAL_BASED_OUTPATIENT_CLINIC_OR_DEPARTMENT_OTHER): Admitting: Hematology

## 2023-07-22 ENCOUNTER — Inpatient Hospital Stay: Attending: Hematology

## 2023-07-22 VITALS — BP 135/74 | HR 66 | Temp 98.6°F | Resp 17 | Wt 240.5 lb

## 2023-07-22 DIAGNOSIS — C9110 Chronic lymphocytic leukemia of B-cell type not having achieved remission: Secondary | ICD-10-CM | POA: Insufficient documentation

## 2023-07-22 DIAGNOSIS — Z79899 Other long term (current) drug therapy: Secondary | ICD-10-CM | POA: Diagnosis not present

## 2023-07-22 DIAGNOSIS — Z8042 Family history of malignant neoplasm of prostate: Secondary | ICD-10-CM | POA: Insufficient documentation

## 2023-07-22 LAB — CMP (CANCER CENTER ONLY)
ALT: 8 U/L (ref 0–44)
AST: 17 U/L (ref 15–41)
Albumin: 4.6 g/dL (ref 3.5–5.0)
Alkaline Phosphatase: 81 U/L (ref 38–126)
Anion gap: 6 (ref 5–15)
BUN: 15 mg/dL (ref 8–23)
CO2: 28 mmol/L (ref 22–32)
Calcium: 9.6 mg/dL (ref 8.9–10.3)
Chloride: 106 mmol/L (ref 98–111)
Creatinine: 1.18 mg/dL (ref 0.61–1.24)
GFR, Estimated: >60 mL/min (ref >60)
Glucose, Bld: 101 mg/dL — ABNORMAL HIGH (ref 70–99)
Potassium: 3.9 mmol/L (ref 3.5–5.1)
Sodium: 140 mmol/L (ref 135–145)
Total Bilirubin: 1.1 mg/dL (ref 0.0–1.2)
Total Protein: 7.2 g/dL (ref 6.5–8.1)

## 2023-07-22 LAB — CBC WITH DIFFERENTIAL (CANCER CENTER ONLY)
Abs Immature Granulocytes: 0 10*3/uL (ref 0.00–0.07)
Basophils Absolute: 0.5 10*3/uL — ABNORMAL HIGH (ref 0.0–0.1)
Basophils Relative: 1 %
Eosinophils Absolute: 0.5 10*3/uL (ref 0.0–0.5)
Eosinophils Relative: 1 %
HCT: 41 % (ref 39.0–52.0)
Hemoglobin: 12.9 g/dL — ABNORMAL LOW (ref 13.0–17.0)
Lymphocytes Relative: 80 %
Lymphs Abs: 39.8 10*3/uL — ABNORMAL HIGH (ref 0.7–4.0)
MCH: 28.1 pg (ref 26.0–34.0)
MCHC: 31.5 g/dL (ref 30.0–36.0)
MCV: 89.3 fL (ref 80.0–100.0)
Monocytes Absolute: 1 10*3/uL (ref 0.1–1.0)
Monocytes Relative: 2 %
Neutro Abs: 8 10*3/uL — ABNORMAL HIGH (ref 1.7–7.7)
Neutrophils Relative %: 16 %
Platelet Count: 181 10*3/uL (ref 150–400)
RBC: 4.59 MIL/uL (ref 4.22–5.81)
RDW: 13 % (ref 11.5–15.5)
Smear Review: NORMAL
WBC Count: 49.7 10*3/uL — ABNORMAL HIGH (ref 4.0–10.5)
nRBC: 0 % (ref 0.0–0.2)

## 2023-07-22 LAB — LACTATE DEHYDROGENASE: LDH: 132 U/L (ref 98–192)

## 2023-07-22 NOTE — Progress Notes (Signed)
 [name]   HEMATOLOGY/ONCOLOGY CLINIC NOTE  Date of Service: 07/22/23   Patient Care Team: Cheron Schaumann., MD as PCP - General (Internal Medicine)  CHIEF COMPLAINTS/PURPOSE OF CONSULTATION:   Follow-up for continued evaluation and management of CLL  HISTORY OF PRESENTING ILLNESS:  Please see previous note for details initial presentation  INTERVAL HISTORY: [name] is a 68 y.o. male here for follow-up for continued evaluation and management of his recently diagnosed CLL.    Patient was last seen by me on 01/24/2023 and he complained of knee swelling after his recent surgery, night sweats after his surgery, and mild fatigue.  Patient notes he has been doing well overall since  our last visit. He denies any new infection issues, fever, chills, night sweats, unexpected weight loss, back pain, chest pain, abdominal pain, new lumps/bumps, SOB, or leg swelling. He does complain of bilateral hip pain. He might be considering another hip surgery around beginning of next year.   He does have a small lymph node on his right neck, which is not new or severe.   He notes he was severely fatigued for couple of days since our last visit. He denies any fatigue during this visit.   He does complain of small skin rash/ward on bilateral fingers. He started noticing these small skin rashes around 57-months ago.   MEDICAL HISTORY:  Past Medical History:  Diagnosis Date   Arthritis    CLL (chronic lymphocytic leukemia) (HCC)    Cyst of kidney, acquired    history of , monitored   High cholesterol    History of kidney stones    x 2 - passed stones, no surgery required   Hypertension     SURGICAL HISTORY: Past Surgical History:  Procedure Laterality Date   APPENDECTOMY     BACK SURGERY     COLONOSCOPY     x several   JOINT REPLACEMENT Left    left knee   KNEE ARTHROTOMY Bilateral 12/28/1968   open cleanout   TONSILLECTOMY     TONSILLECTOMY AND ADENOIDECTOMY     TOTAL  HIP ARTHROPLASTY Right 01/01/2023   Procedure: RIGHT TOTAL HIP ARTHROPLASTY ANTERIOR APPROACH;  Surgeon: Tarry Kos, MD;  Location: [facility] OR;  Service: Orthopedics;  Laterality: Right;  3-C   TOTAL KNEE ARTHROPLASTY Left 03/13/2015   Procedure: LEFT TOTAL KNEE ARTHROPLASTY;  Surgeon: Salvatore Marvel, MD;  Location: [facility] OR;  Service: Orthopedics;  Laterality: Left;   TOTAL KNEE ARTHROPLASTY Right 04/10/2021   Procedure: RIGHT TOTAL KNEE ARTHROPLASTY;  Surgeon: Cammy Copa, MD;  Location[facility] OR;  Service: Orthopedics;  Laterality: Right;   WISDOM TOOTH EXTRACTION      SOCIAL HISTORY: Social History   Socioeconomic History   Marital status: Married    Spouse name: Not on file   Number of children: Not on file   Years of education: Not on file   Highest education level: Not on file  Occupational History   Not on file  Tobacco Use   Smoking status: Former    Current packs/day: 0.00    Types: Cigarettes    Quit date: 04/29/1973    Years since quitting: 50.2   Smokeless tobacco: Current  Vaping Use   Vaping status: Every Day   Substances: THC, CBD  Substance and Sexual Activity   Alcohol use: Not Currently    Alcohol/week: 3.0 standard drinks of alcohol    Types: 3 Standard drinks or equivalent per week   Drug use: Yes  Comment: CBD oil and gummies   Sexual activity: Yes  Other Topics Concern   Not on file  Social History Narrative   Not on file   Social Drivers of Health   Financial Resource Strain: Not on file  Food Insecurity: Low Risk  (10/21/2022)   [facility]   Hunger Vital Sign    Worried About Running Out of Food in the Last Year: Never true    Ran Out of Food in the Last Year: Never true  Transportation Needs: Not on file (10/21/2022)  Physical Activity: Not on file  Stress: Not on file  Social Connections: Unknown (07/05/2022)   [facility], [facility]   Social Network    Social Network: Not on file   Intimate Partner Violence: Unknown (07/05/2022)   [facility] from [facility], [facility]   HITS    Physically Hurt: Not on file    Insult or Talk Down To: Not on file    Threaten Physical Harm: Not on file    Scream or Curse: Not on file    FAMILY HISTORY: No reported family history of known blood disorders or cancers.  ALLERGIES:  is allergic to other.  MEDICATIONS:  Current Outpatient Medications  Medication Sig Dispense Refill   traMADol (ULTRAM) 50 MG tablet Take 1 tablet (50 mg total) by mouth 3 (three) times daily as needed. 30 tablet 0   Ascorbic Acid (VITAMIN C) 1000 MG tablet Take 1,000 mg by mouth daily.     Ashwagandha 500 MG CAPS Take 500 mg by mouth daily.     aspirin EC 81 MG tablet Take 1 tablet (81 mg total) by mouth 2 (two) times daily. To be taken after surgery to prevent blood clots 84 tablet 0   Aspirin-Caffeine (BC FAST PAIN RELIEF PO) Take 1 packet by mouth daily as needed (pain).     Calcium Citrate-Vitamin D (CALCIUM + D PO) Take 1 tablet by mouth daily.     cholecalciferol (VITAMIN D3) 25 MCG (1000 UNIT) tablet Take 1,000 Units by mouth daily.     docusate sodium (COLACE) 100 MG capsule Take 1 capsule (100 mg total) by mouth daily as needed. 30 capsule 2   methocarbamol (ROBAXIN-750) 750 MG tablet Take 1 tablet (750 mg total) by mouth 2 (two) times daily as needed for muscle spasms. 20 tablet 2   milk thistle 175 MG tablet Take 175 mg by mouth daily.     olmesartan-hydrochlorothiazide (BENICAR HCT) 40-12.5 MG tablet Take 1 tablet by mouth daily.     olopatadine (PATANOL) 0.1 % ophthalmic solution Place 1 drop into both eyes 2 (two) times daily as needed for allergies.     pravastatin (PRAVACHOL) 40 MG tablet Take 40 mg by mouth daily.     sodium chloride (OCEAN) 0.65 % SOLN nasal spray Place 1 spray into both nostrils as needed for congestion.     tadalafil (CIALIS) 5 MG tablet Take 5 mg by mouth daily as needed for erectile dysfunction.     Vitamin A 2400  MCG (8000 UT) CAPS Take 8,000 Units by mouth daily.     vitamin B-12 (CYANOCOBALAMIN) 500 MCG tablet Take 500 mcg by mouth daily.     zinc gluconate 50 MG tablet Take 50 mg by mouth daily.     No current facility-administered medications for this visit.    REVIEW OF SYSTEMS:   10 Point review of Systems was done is negative except as noted  above.  PHYSICAL EXAMINATION: ECOG PERFORMANCE STATUS: 1  . Vitals:   07/22/23 1339  BP: 135/74  Pulse: 66  Resp: 17  Temp: 98.6 F (37 C)  SpO2: 98%     Filed Weights   07/22/23 1339  Weight: 240 lb 8 oz (109.1 kg)    .Body mass index is 30.06 kg/m. NAD GENERAL:alert, in no acute distress and comfortable SKIN: no acute rashes, no significant lesions EYES: conjunctiva are pink and non-injected, sclera anicteric NECK: supple, no JVD LYMPH:  no palpable lymphadenopathy in the cervical, axillary or inguinal regions LUNGS: clear to auscultation b/l with normal respiratory effort HEART: regular rate & rhythm ABDOMEN:  normoactive bowel sounds , non tender, not distended. Extremity: no pedal edema PSYCH: alert & oriented x 3 with fluent speech NEURO: no focal motor/sensory deficits  LABORATORY DATA:  I have reviewed the data as listed     Latest Ref Rng & Units 07/22/2023    8:51 AM 01/24/2023    1:07 PM 01/01/2023   10:39 AM  CBC  WBC 4.0 - 10.5 K/uL 49.7  39.6  41.5   Hemoglobin 13.0 - 17.0 g/dL 16.1  9.7  09.6   Hematocrit 39.0 - 52.0 % 41.0  31.0  39.0   Platelets 150 - 400 K/uL 181  316  176    .CBC    Component Value Date/Time   WBC 49.7 (H) 07/22/2023 0851   WBC 41.5 (H) 01/01/2023 1039   RBC 4.59 07/22/2023 0851   HGB 12.9 (L) 07/22/2023 0851   HCT 41.0 07/22/2023 0851   PLT 181 07/22/2023 0851   MCV 89.3 07/22/2023 0851   MCH 28.1 07/22/2023 0851   MCHC 31.5 07/22/2023 0851   RDW 13.0 07/22/2023 0851   LYMPHSABS 39.8 (H) 07/22/2023 0851   MONOABS 1.0 07/22/2023 0851   EOSABS 0.5 07/22/2023 0851   BASOSABS  0.5 (H) 07/22/2023 0851     .    Latest Ref Rng & Units 07/22/2023    8:51 AM 01/24/2023    1:07 PM 01/01/2023   10:39 AM  CMP  Glucose 70 - 99 mg/dL 045  409  811   BUN 8 - 23 mg/dL 15  13  9    Creatinine 0.61 - 1.24 mg/dL 9.14  7.82  9.56   Sodium 135 - 145 mmol/L 140  139  140   Potassium 3.5 - 5.1 mmol/L 3.9  4.1  5.3   Chloride 98 - 111 mmol/L 106  105  106   CO2 22 - 32 mmol/L 28  28  24    Calcium 8.9 - 10.3 mg/dL 9.6  9.5  9.1   Total Protein 6.5 - 8.1 g/dL 7.2  7.1    Total Bilirubin 0.0 - 1.2 mg/dL 1.1  0.7    Alkaline Phos 38 - 126 U/L 81  109    AST 15 - 41 U/L 17  18    ALT 0 - 44 U/L 8  10     . Lab Results  Component Value Date   LDH 132 07/22/2023    Component     Latest Ref Rng & Units 05/09/2021  DAT, complement      NEG  DAT, IgG      NEG . . .  Ferritin     24 - 336 ng/mL 193  Vitamin B12     180 - 914 pg/mL 479  Haptoglobin     32 - 363 mg/dL 213  HIV Screen 4th  Generation wRfx     Non Reactive Non Reactive  HCV Ab     NON REACTIVE NON REACTIVE  Hep B Core Total Ab     NON REACTIVE NON REACTIVE  Hepatitis B Surface Ag     NON REACTIVE NON REACTIVE  LDH     98 - 192 U/L 133      RADIOGRAPHIC STUDIES: I have personally reviewed the radiological images as listed and agreed with the findings in the report.  PET/CT 05/17/2021:IMPRESSION: 1. Hypermetabolic prominent/mildly enlarged right pelvic lymph nodes, with activity greater than liver ([place] 4).   2. Low level metabolic activity within prominent bilateral axillary, retroperitoneal and left pelvic sidewall lymph nodes.   3.  No splenomegaly.   4. Asymmetric nodular hyperactivity on the right side of the prostate gland with a max SUV of 7.26 without discrete CT correlate, nonspecific but prostate carcinoma not excluded, recommend correlation with PSA and urology consult.   5. Hypermetabolic focus in the left hemipelvis along the gonadal vessels with a max SUV of 6.75, without  discrete CT correlate and possibly vascular in etiology, attention on follow-up imaging suggested.   6. Hypermetabolic hyperplasia of the tonsils slightly asymmetric to the right with a max SUV of 5.1 common likely reactive.   7. Bilateral symmetric diffuse parotid gland hypermetabolism with max SUV of 4.9 on the right and 4.7 on the left without focal hypermetabolism or CT correlate, likely inflammatory     Electronically Signed   By: Maudry Mayhew M.D.   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 Incidentally noted on labs in the context of preoperative labs prior to his recent right TKA. CLL FISH panel with 11 q. and 13 q. deletions  2) incidental note of FDG avid prostate lesions. Patient has a family history of prostate cancer. He notes that he has been followed by urology previously for elevated PSA levels and he has had prostate biopsy previously  PLAN: -Discussed lab results from today, 07/22/2023, in detail with the patient. CBC shows elevated WBC of 49.7 K from 39.6 K and normal Hgb of 12.9 g/dL. Stable CMP. LDH wnl @ 132 -He has no significant lab changes,constitutional symptoms for any other signs or symptoms of significant CLL progression at this time. - no indication to initiate CLL treatment at this time. -continue to move around and stay regularly active  -continue Aspirin to reduce the risk of blood clots -continue to use compression socks to reduce risk of blood clot -continue to stay well hydrated -recommend patient to stay UTD with age-appropriate vaccinations including  flu, COVID-19 booster, RSV, pneumonia, and shingrix.  -continue to take vitamin B complex -continue to take oral iron -recommend patient to regularly consume a diet with fresh fruits and vegetables -recommend 2000 units of vitamin D daily to support the immune system and prevent any deficiencies -answered all of patient's questions in detail -continue to follow with urologist to monitor PSA levels  FOLLOW-UP: RTC with Dr Candise Che with labs in 6 months  The total time spent in the appointment was 21 minutes* .  All of the patient's questions were answered with apparent  satisfaction. The patient knows to call the clinic with any problems, questions or concerns.   Wyvonnia Lora MD MS AAHIVMS [facility] Hematology/Oncology Physician [facility]  .*Total Encounter Time as defined by the Centers for Medicare and Medicaid Services includes, in addition to the face-to-face time of a patient visit (documented in the note above) non-face-to-face time: obtaining and reviewing outside history, ordering and reviewing medications, tests or procedures, care coordination (communications with other health care professionals or caregivers) and documentation in the medical record.   I,Param Shah,acting as a Neurosurgeon for Wyvonnia Lora, MD.,have documented all relevant documentation on the behalf of Wyvonnia Lora, MD,as directed by  Wyvonnia Lora, MD while in the presence of Wyvonnia Lora, MD.  .I have reviewed the above documentation for accuracy and completeness, and I agree with the above. Johney Maine MD

## 2023-07-24 ENCOUNTER — Telehealth: Payer: Self-pay | Admitting: Hematology

## 2023-07-24 NOTE — Telephone Encounter (Signed)
 Left patient a vm regarding upcoming appointment

## 2023-08-26 ENCOUNTER — Other Ambulatory Visit: Payer: Self-pay | Admitting: Rehabilitation

## 2023-08-26 DIAGNOSIS — M5416 Radiculopathy, lumbar region: Secondary | ICD-10-CM

## 2023-09-16 ENCOUNTER — Encounter: Payer: Self-pay | Admitting: Rehabilitation

## 2023-09-26 ENCOUNTER — Ambulatory Visit
Admission: RE | Admit: 2023-09-26 | Discharge: 2023-09-26 | Disposition: A | Discharge status: Home / Self Care | Source: Ambulatory Visit | Attending: Rehabilitation | Admitting: Rehabilitation

## 2023-09-26 DIAGNOSIS — M5416 Radiculopathy, lumbar region: Secondary | ICD-10-CM

## 2023-09-26 MED ORDER — GADOPICLENOL 0.5 MMOL/ML IV SOLN
10.0000 mL | Freq: Once | INTRAVENOUS | Status: AC | PRN
  Administered 2023-09-26: 10 mL via INTRAVENOUS

## 2023-12-19 ENCOUNTER — Ambulatory Visit: Payer: Managed Care, Other (non HMO) | Admitting: Orthopaedic Surgery

## 2023-12-19 ENCOUNTER — Other Ambulatory Visit (INDEPENDENT_AMBULATORY_CARE_PROVIDER_SITE_OTHER): Payer: Self-pay

## 2023-12-19 DIAGNOSIS — Z96641 Presence of right artificial hip joint: Secondary | ICD-10-CM | POA: Diagnosis not present

## 2023-12-19 NOTE — Progress Notes (Signed)
 Patient: Kevin Patterson           Date of Birth: Sep 02, 1955           MRN: [id number] Visit Date: 12/19/2023 PCP: Andrew Truman GRADE., MD   Assessment & Plan:  Chief Complaint:  Chief Complaint  Patient presents with   Right Hip - Routine Post Op, Follow-up   Visit Diagnoses:  1. Status post total replacement of right hip     Plan: History of Present Illness Kevin Patterson is a 68 year old male who presents for a one-year follow-up after right hip replacement.  His right hip is functioning well one year post-replacement. He is experiencing back pain above his previous fusion, identified as adjacent level disease, and is receiving injections for this condition. He experienced severe pain at work after getting up from a toilet, leading to two emergency room visits. During these visits, it was determined that while his hip was problematic, the primary issue was his back. His right hip is beginning to show signs of discomfort.  Exam of the right hip shows full healed surgical scar.  Fluid painless range of motion.  Assessment and Plan Status post right total hip arthroplasty One year post-surgery with good function and stable implant position. - Continue dental prophylaxis for one more year. - Return for follow-up in one year with x-ray.  Follow-Up Instructions: Return in about 1 year (around 12/18/2024).   Orders:  Orders Placed This Encounter  Procedures   XR HIP UNILAT W OR W/O PELVIS 2-3 VIEWS RIGHT   No orders of the defined types were placed in this encounter.   Imaging: XR HIP UNILAT W OR W/O PELVIS 2-3 VIEWS RIGHT Result Date: 12/19/2023 Stable right total hip replacement without complication.   PMFS History: Patient Active Problem List   Diagnosis Date Noted   Status post total replacement of right hip 01/01/2023   Primary osteoarthritis of right hip 12/08/2022   Arthritis of right knee    S/P total knee replacement, right 04/10/2021    DJD (degenerative joint disease) of knee 03/13/2015   Past Medical History:  Diagnosis Date   Arthritis    CLL (chronic lymphocytic leukemia) (HCC)    Cyst of kidney, acquired    history of , monitored   High cholesterol    History of kidney stones    x 2 - passed stones, no surgery required   Hypertension     No family history on file.  Past Surgical History:  Procedure Laterality Date   APPENDECTOMY     BACK SURGERY     COLONOSCOPY     x several   JOINT REPLACEMENT Left    left knee   KNEE ARTHROTOMY Bilateral 12/28/1968   open cleanout   TONSILLECTOMY     TONSILLECTOMY AND ADENOIDECTOMY     TOTAL HIP ARTHROPLASTY Right 01/01/2023   Procedure: RIGHT TOTAL HIP ARTHROPLASTY ANTERIOR APPROACH;  Surgeon: Jerri Kay HERO, MD;  Location: [facility] OR;  Service: Orthopedics;  Laterality: Right;  3-C   TOTAL KNEE ARTHROPLASTY Left 03/13/2015   Procedure: LEFT TOTAL KNEE ARTHROPLASTY;  Surgeon: Lamar Millman, MD;  Location: [facility] OR;  Service: Orthopedics;  Laterality: Left;   TOTAL KNEE ARTHROPLASTY Right 04/10/2021   Procedure: RIGHT TOTAL KNEE ARTHROPLASTY;  Surgeon: Addie Cordella Hamilton, MD;  Location: [facility] OR;  Service: Orthopedics;  Laterality: Right;   WISDOM TOOTH EXTRACTION     Social History   Occupational History   Not on file  Tobacco Use   Smoking status: Former    Current packs/day: 0.00    Types: Cigarettes    Quit date: 04/29/1973    Years since quitting: 50.6   Smokeless tobacco: Current  Vaping Use   Vaping status: Every Day   Substances: THC, CBD  Substance and Sexual Activity   Alcohol use: Not Currently    Alcohol/week: 3.0 standard drinks of alcohol    Types: 3 Standard drinks or equivalent per week   Drug use: Yes    Comment: CBD oil and gummies   Sexual activity: Yes

## 2024-01-19 ENCOUNTER — Other Ambulatory Visit: Payer: Self-pay

## 2024-01-19 DIAGNOSIS — C9110 Chronic lymphocytic leukemia of B-cell type not having achieved remission: Secondary | ICD-10-CM

## 2024-01-23 ENCOUNTER — Inpatient Hospital Stay: Attending: Hematology | Admitting: Hematology

## 2024-01-23 ENCOUNTER — Inpatient Hospital Stay

## 2024-01-27 ENCOUNTER — Telehealth: Payer: Self-pay | Admitting: Hematology

## 2024-01-27 NOTE — Telephone Encounter (Signed)
 [place] called in to re schedule his missed appointment.

## 2024-02-11 ENCOUNTER — Inpatient Hospital Stay: Attending: Hematology

## 2024-02-11 ENCOUNTER — Inpatient Hospital Stay: Admitting: Hematology

## 2024-02-11 VITALS — BP 131/72 | HR 56 | Temp 97.0°F | Resp 20 | Wt 236.2 lb

## 2024-02-11 DIAGNOSIS — Z87891 Personal history of nicotine dependence: Secondary | ICD-10-CM | POA: Insufficient documentation

## 2024-02-11 DIAGNOSIS — C9110 Chronic lymphocytic leukemia of B-cell type not having achieved remission: Secondary | ICD-10-CM | POA: Insufficient documentation

## 2024-02-11 LAB — CBC WITH DIFFERENTIAL (CANCER CENTER ONLY)
Basophils Absolute: 0 K/uL (ref 0.0–0.1)
Basophils Relative: 0 %
Eosinophils Absolute: 0.5 K/uL (ref 0.0–0.5)
Eosinophils Relative: 1 %
HCT: 40 % (ref 39.0–52.0)
Hemoglobin: 12.5 g/dL — ABNORMAL LOW (ref 13.0–17.0)
Lymphocytes Relative: 67 %
Lymphs Abs: 36.1 K/uL — ABNORMAL HIGH (ref 0.7–4.0)
MCH: 28.3 pg (ref 26.0–34.0)
MCHC: 31.3 g/dL (ref 30.0–36.0)
MCV: 90.5 fL (ref 80.0–100.0)
Monocytes Absolute: 2.7 K/uL — ABNORMAL HIGH (ref 0.1–1.0)
Monocytes Relative: 5 %
Neutro Abs: 14.6 K/uL — ABNORMAL HIGH (ref 1.7–7.7)
Neutrophils Relative %: 27 %
Platelet Count: 174 K/uL (ref 150–400)
RBC: 4.42 MIL/uL (ref 4.22–5.81)
RDW: 12.9 % (ref 11.5–15.5)
WBC Count: 53.9 K/uL (ref 4.0–10.5)
nRBC: 0 % (ref 0.0–0.2)

## 2024-02-11 LAB — CMP (CANCER CENTER ONLY)
ALT: 11 U/L (ref 0–44)
AST: 20 U/L (ref 15–41)
Albumin: 4.7 g/dL (ref 3.5–5.0)
Alkaline Phosphatase: 97 U/L (ref 38–126)
Anion gap: 4 — ABNORMAL LOW (ref 5–15)
BUN: 13 mg/dL (ref 8–23)
CO2: 31 mmol/L (ref 22–32)
Calcium: 10.1 mg/dL (ref 8.9–10.3)
Chloride: 105 mmol/L (ref 98–111)
Creatinine: 1.02 mg/dL (ref 0.61–1.24)
GFR, Estimated: >60 mL/min (ref >60)
Glucose, Bld: 92 mg/dL (ref 70–99)
Potassium: 4.5 mmol/L (ref 3.5–5.1)
Sodium: 140 mmol/L (ref 135–145)
Total Bilirubin: 1 mg/dL (ref 0.0–1.2)
Total Protein: 7.1 g/dL (ref 6.5–8.1)

## 2024-02-11 LAB — LACTATE DEHYDROGENASE: LDH: 150 U/L (ref 98–192)

## 2024-02-11 NOTE — Progress Notes (Signed)
 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 male here for follow-up for continued evaluation and management of his recently diagnosed CLL.    Last seen by me on 07/22/2023 and mentioned experiencing bilateral hip pain and was possibly considering another hip surgery around beginning of next year. Additionally, endorsed severe fatigue for couple of days and small skin rash/ward on bilateral fingers which he noticed 23-months prior to visit.   Today, he reports experiencing back issues related to degenerative Arthritis, which has been preventing him from exercise; this is managed with epidural lidocaine /streoid injections of which he's had 2 thus far. He notes that after receiving injections, he does experience drenching night sweats.  Does deny fevers/chills, unexpected weight change, abdominal pain, and abdominal distention, change in bowel/urinary habits, new lumps/bumps. Reports having seen Urology 2 weeks ago. He endorses testicular/epidymidis swelling.   Mentions that at one point he was experiencing SOB, which was evaluated by Cardiology, and found to be heart burn, which he realized is triggered after consuming sunflower seeds and is now managed with Prilosec.  Additionally, says he has been fatigued, which he attributes to not sleeping well. 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   Reports to smoking 1 cigar daily.  MEDICAL HISTORY:  Past Medical History:  Diagnosis Date   Arthritis    CLL (chronic lymphocytic  leukemia) (HCC)    Cyst of kidney, acquired    history of , monitored   High cholesterol    History of kidney stones    x 2 - passed stones, no surgery required   Hypertension     SURGICAL HISTORY: Past Surgical History:  Procedure Laterality Date   APPENDECTOMY     BACK SURGERY     COLONOSCOPY     x several   JOINT REPLACEMENT Left    left knee   KNEE ARTHROTOMY Bilateral 12/28/1968   open cleanout   TONSILLECTOMY     TONSILLECTOMY AND ADENOIDECTOMY     TOTAL HIP ARTHROPLASTY Right 01/01/2023   Procedure: RIGHT TOTAL HIP ARTHROPLASTY ANTERIOR APPROACH;  Surgeon: Jerri Kay HERO, MD;  Location: [facility] OR;  Service: Orthopedics;  Laterality: Right;  3-C   TOTAL KNEE ARTHROPLASTY Left 03/13/2015   Procedure: LEFT TOTAL KNEE ARTHROPLASTY;  Surgeon: Lamar Millman, MD;  Location[facility] OR;  Service: Orthopedics;  Laterality: Left;   TOTAL KNEE ARTHROPLASTY Right 04/10/2021   Procedure: RIGHT TOTAL KNEE ARTHROPLASTY;  Surgeon: Addie Cordella Hamilton, MD;  Location[facility] OR;  Service: Orthopedics;  Laterality: Right;   WISDOM TOOTH EXTRACTION     SOCIAL HISTORY: Social History   Socioeconomic History   Marital status: Married    Spouse name: Not on file   Number of children: Not on file   Years of education: Not on file   Highest education level: Not on file  Occupational History   Not on file  Tobacco Use   Smoking status: Former    Current packs/day: 0.00    Types: Cigarettes    Quit date: 04/29/1973  Years since quitting: 50.8   Smokeless tobacco: Current  Vaping Use   Vaping status: Every Day   Substances: THC, CBD  Substance and Sexual Activity   Alcohol use: Not Currently    Alcohol/week: 3.0 standard drinks of alcohol    Types: 3 Standard drinks or equivalent per week   Drug use: Yes    Comment: CBD oil and gummies   Sexual activity: Yes  Other Topics Concern   Not on file  Social History Narrative   Not on file   Social Drivers of Health   Financial Resource Strain:  Low Risk  (09/26/2023)   Received from [facility]   Overall Financial Resource Strain (CARDIA)    Difficulty of Paying Living Expenses: Not hard at all  Food Insecurity: No Food Insecurity (09/26/2023)   Received from [facility]   Hunger Vital Sign    Within the past 12 months, you worried that your food would run out before you got the money to buy more.: Never true    Within the past 12 months, the food you bought just didn't last and you didn't have money to get more.: Never true  Transportation Needs: No Transportation Needs (09/26/2023)   Received from [facility] - Transportation    Lack of Transportation (Medical): No    Lack of Transportation (Non-Medical): No  Physical Activity: Not on file  Stress: Not on file  Social Connections: Unknown (07/05/2022)   Received from [facility]   Social Network    Social Network: Not on file  Intimate Partner Violence: Unknown (07/05/2022)   Received from [facility]   HITS    Physically Hurt: Not on file    Insult or Talk Down To: Not on file    Threaten Physical Harm: Not on file    Scream or Curse: Not on file    FAMILY HISTORY: No reported family history of known blood disorders or cancers.  ALLERGIES:  is allergic to other.  MEDICATIONS:  Current Outpatient Medications  Medication Sig Dispense Refill   traMADol  (ULTRAM ) 50 MG tablet Take 1 tablet (50 mg total) by mouth 3 (three) times daily as needed. 30 tablet 0   Ascorbic Acid (VITAMIN C) 1000 MG tablet Take 1,000 mg by mouth daily.     Ashwagandha 500 MG CAPS Take 500 mg by mouth daily.     aspirin  EC 81 MG tablet Take 1 tablet (81 mg total) by mouth 2 (two) times daily. To be taken after surgery to prevent blood clots 84 tablet 0   Aspirin -Caffeine (BC FAST PAIN RELIEF PO) Take 1 packet by mouth daily as needed (pain).     Calcium  Citrate-Vitamin D  (CALCIUM  + D PO) Take 1 tablet by mouth daily.     cholecalciferol (VITAMIN D3) 25 MCG (1000 UNIT) tablet  Take 1,000 Units by mouth daily.     methocarbamol  (ROBAXIN -750) 750 MG tablet Take 1 tablet (750 mg total) by mouth 2 (two) times daily as needed for muscle spasms. 20 tablet 2   milk thistle 175 MG tablet Take 175 mg by mouth daily.     olmesartan -hydrochlorothiazide  (BENICAR  HCT) 40-12.5 MG tablet Take 1 tablet by mouth daily.     pravastatin (PRAVACHOL) 40 MG tablet Take 40 mg by mouth daily.     sodium chloride  (OCEAN) 0.65 % SOLN nasal spray Place 1 spray into both nostrils as needed for congestion.     tadalafil (CIALIS) 5 MG tablet Take 5 mg by  mouth daily as needed for erectile dysfunction.     Vitamin A 2400 MCG (8000 UT) CAPS Take 8,000 Units by mouth daily.     vitamin B-12 (CYANOCOBALAMIN) 500 MCG tablet Take 500 mcg by mouth daily.     zinc gluconate 50 MG tablet Take 50 mg by mouth daily.     No current facility-administered medications for this visit.    REVIEW OF SYSTEMS:   10 Point review of Systems was done is negative except as noted above.  PHYSICAL EXAMINATION: ECOG PERFORMANCE STATUS: 1  Vitals:   02/11/24 1340  BP: 131/72  Pulse: (!) 56  Resp: 20  Temp: (!) 97 F (36.1 C)  SpO2: 99%   Filed Weights   02/11/24 1340  Weight: 236 lb 3.2 oz (107.1 kg)   Body mass index is 29.52 kg/m. NAD GENERAL:alert, in no acute distress and comfortable SKIN: no acute rashes, no significant lesions EYES: conjunctiva are pink and non-injected, sclera anicteric NECK: supple, no JVD LYMPH:  no palpable lymphadenopathy in the cervical, axillary, inguinal regions LUNGS: clear to auscultation b/l with normal respiratory effort HEART: regular rate & rhythm ABDOMEN:  normoactive bowel sounds , non tender, not distended. No splenomegaly Extremity: no pedal edema PSYCH: alert & oriented x 3 with fluent speech NEURO: no focal motor/sensory deficits  LABORATORY DATA:  I have reviewed the data as listed     Latest Ref Rng & Units 02/11/2024   12:59 PM 07/22/2023    8:51  AM 01/24/2023    1:07 PM  CBC EXTENDED  WBC 4.0 - 10.5 K/uL 53.9  49.7  39.6   RBC 4.22 - 5.81 MIL/uL 4.42  4.59  3.37   Hemoglobin 13.0 - 17.0 g/dL 87.4  87.0  9.7   HCT 60.9 - 52.0 % 40.0  41.0  31.0   Platelets 150 - 400 K/uL 174  181  316   NEUT# 1.7 - 7.7 K/uL 14.6  8.0  3.8   Lymph# 0.7 - 4.0 K/uL 36.1  39.8  30.9    CBC w/ Differential:  Lab Results  Component Value Date   WBC 53.9 (HH) 02/11/2024   RBC 4.42 02/11/2024   HGB 12.5 (L) 02/11/2024   HCT 40.0 02/11/2024   PLT 174 02/11/2024   MCV 90.5 02/11/2024   MCH 28.3 02/11/2024   MCHC 31.3 02/11/2024   RDW 12.9 02/11/2024   MPV 12.7 (H) 04/06/2021   LYMPHSABS 36.1 (H) 02/11/2024   MONOABS 2.7 (H) 02/11/2024   BASOSABS 0.0 02/11/2024   Lab Results  Component Value Date   EOSABS 0.5 02/11/2024      Latest Ref Rng & Units 02/11/2024   12:59 PM 07/22/2023    8:51 AM 01/24/2023    1:07 PM  CMP  Glucose 70 - 99 mg/dL 92  898  890   BUN 8 - 23 mg/dL 13  15  13    Creatinine 0.61 - 1.24 mg/dL 8.97  8.81  8.95   Sodium 135 - 145 mmol/L 140  140  139   Potassium 3.5 - 5.1 mmol/L 4.5  3.9  4.1   Chloride 98 - 111 mmol/L 105  106  105   CO2 22 - 32 mmol/L 31  28  28    Calcium  8.9 - 10.3 mg/dL 89.8  9.6  9.5   Total Protein 6.5 - 8.1 g/dL 7.1  7.2  7.1   Total Bilirubin 0.0 - 1.2 mg/dL 1.0  1.1  0.7   Alkaline Phos  38 - 126 U/L 97  81  109   AST 15 - 41 U/L 20  17  18    ALT 0 - 44 U/L 11  8  10     LDH Lab Results  Component Value Date   LDH 150 02/11/2024    Component     Latest Ref Rng & Units 05/09/2021  DAT, complement      NEG  DAT, IgG      NEG . . .  Ferritin     24 - 336 ng/mL 193  Vitamin B12     180 - 914 pg/mL 479  Haptoglobin     32 - 363 mg/dL 806  HIV Screen 4th Generation wRfx     Non Reactive Non Reactive  HCV Ab     NON REACTIVE NON REACTIVE  Hep B Core Total Ab     NON REACTIVE NON REACTIVE  Hepatitis B Surface Ag     NON REACTIVE NON REACTIVE  LDH     98 - 192 U/L 133       RADIOGRAPHIC STUDIES: I have personally reviewed the radiological images as listed and agreed with the findings in the report.  PET/CT 05/17/2021:IMPRESSION: 1. Hypermetabolic prominent/mildly enlarged right pelvic lymph nodes, with activity greater than liver ([place] 4).   2. Low level metabolic activity within prominent bilateral axillary, retroperitoneal and left pelvic sidewall lymph nodes.   3.  No splenomegaly.   4. Asymmetric nodular hyperactivity on the right side of the prostate gland with a max SUV of 7.26 without discrete CT correlate, nonspecific but prostate carcinoma not excluded, recommend correlation with PSA and urology consult.   5. Hypermetabolic focus in the left hemipelvis along the gonadal vessels with a max SUV of 6.75, without discrete CT correlate and possibly vascular in etiology, attention on follow-up imaging suggested.   6. Hypermetabolic hyperplasia of the tonsils slightly asymmetric to the right with a max SUV of 5.1 common likely reactive.   7. Bilateral symmetric diffuse parotid gland hypermetabolism with max SUV of 4.9 on the right and 4.7 on the left without focal hypermetabolism or CT correlate, likely inflammatory     Electronically Signed   By: Reyes Holder M.D.   On: 05/17/2021 12:59  CT Angio Chest Pulmonary Embolism 12/02/2023  CLINICAL HISTORY: dyspnea, chest pain, hx CLL  COMPARISON: No Comparison.  TECHNIQUE: Following administration of IV contrast, CT angiography of the chest was performed including 3D MIP reformations. If this exam was performed in  conjunction with another study in the same setting, the volume of IV contrast dictated in each report was the total volume of contrast administered for the exams.  Dose lowering technique(s) such as automated exposure control, iterative reconstruction, and mA and/or KV adjustment for patient size was utilized for this exam.  CONTRAST: iohexoL (OMNIPAQUE) 350 mg  iodine /mL injection (MDV) 80 mL - amount administered: 80 mL  //  FINDINGS: LOWER NECK: Unremarkable.  LUNGS, AIRWAY, AND PLEURA: Unremarkable.  MEDIASTINUM/HILA AND LYMPH NODES: Bilateral axillary adenopathy compatible with patient's history of CLL, for example a left axillary lymph node measures 1.7 x 2.4 cm (5-30), and a right axillary lymph node measures 2.1 x 2.0 cm. Additional mediastinal insert per coronavirus cure adenopathy, for example a subcarinal lymph node measures 1.2 x 2.1 cm (5-48).  HEART AND PERICARDIUM: Unremarkable.  VASCULATURE: No pulmonary embolism.  BONES AND SOFT TISSUES: Degenerative changes of the thoracic spine.  UPPER ABDOMEN: Partially visualized probable renal cysts though poorly  characterized on this study see prior CT abdomen.  IMPRESSION: 1. No pulmonary embolism. 2. Adenopathy as described above compatible with patient's history of CLL.   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 Incidentally noted on labs in the context of preoperative labs prior to his recent right TKA. CLL FISH panel with 11 q. and 13 q. deletions  2) incidental note of FDG avid prostate lesions. Patient has a family history of prostate cancer. He notes that he has been followed by urology previously for elevated PSA levels and he has had prostate biopsy previously  PLAN:  - Discussed lab results on 02/11/2024 in detail with patient: CBC showed WBC of 53.9K increased from 49.7K, Lymphocytes of 36.1K decreased from 39.8K, and Neutrophils of 14.6K increased from 8K CMP stable. LDH is WNL at 150.  - I addressed  smoking cessation with the patient in detail, explaining the health benefits   - Vaccine counseling provided: recommend staying up to date with age-recommended vaccinations (flu, PNA, RSV, Covid-19); is UTD on Shingrix.  Reviewed CTA 12/02/2023:  MEDIASTINUM/HILA AND LYMPH NODES: Bilateral axillary adenopathy compatible with patient's history of CLL, for example a left axillary lymph node measures 1.7 x 2.4 cm (5-30), and a right axillary lymph node measures 2.1 x 2.0 cm. Additional mediastinal insert per coronavirus cure adenopathy, for example a subcarinal lymph node measures 1.2 x 2.1 cm (5-48).  - He has no significant lab changes,constitutional symptoms for any other signs or symptoms of significant CLL progression at this time. No indication to initiate CLL treatment at this time.  - Continue to move around and stay regularly active  - Continue Aspirin  and using compression socks to reduce risk of blood clot - Continue to stay well hydrated - Continue to take vitamin B complex and PO Iron Recommend 2000 units of vitamin D  daily to support the immune system and prevent any deficiencies - Recommend patient to regularly consume a diet with fresh fruits and vegetables - Continue to follow with urologist to monitor PSA levels  FOLLOW-UP: RTC with Dr Patterson with labs in 6 months  The total time spent in the appoi  .The total time spent in the appointment was 30 minutes* .  All of the patient's questions were answered with apparent satisfaction. The patient knows to call the clinic with any problems, questions or concerns.   Kevin Onesimo MD MS AAHIVMS [facility] Hematology/Oncology Physician [facility]  .*Total Encounter Time as defined by the Centers for Medicare and Medicaid Services includes, in addition to the face-to-face time of a patient visit (documented in the note above) non-face-to-face time: obtaining and reviewing outside history, ordering and reviewing  medications, tests or procedures, care coordination (communications with other health care professionals or caregivers) and documentation in the medical record.    I,  Damien Lagle,acting as a scribe for Kevin Onesimo, MD.,have documented all relevant documentation on the behalf of Kevin Onesimo, MD,as directed by  Kevin Onesimo, MD while in the presence of Kevin Onesimo, MD.  I have reviewed the above documentation for accuracy and completeness, and I agree with the above. Kevin Candida Onesimo MD.

## 2024-03-01 ENCOUNTER — Encounter: Payer: Self-pay | Admitting: Radiology

## 2024-08-13 ENCOUNTER — Inpatient Hospital Stay: Admitting: Hematology

## 2024-08-13 ENCOUNTER — Inpatient Hospital Stay

## 2024-12-17 ENCOUNTER — Ambulatory Visit: Admitting: Orthopaedic Surgery
